# Patient Record
Sex: Female | Born: 1970 | Race: Black or African American | Hispanic: No | Marital: Married | Smoking: Never smoker
Health system: Southern US, Community
[De-identification: ages and names within clinical notes are randomized; demographics above are authoritative.]

## PROBLEM LIST (undated history)

## (undated) DIAGNOSIS — G4730 Sleep apnea, unspecified: Secondary | ICD-10-CM

## (undated) DIAGNOSIS — K6289 Other specified diseases of anus and rectum: Secondary | ICD-10-CM

## (undated) DIAGNOSIS — Z951 Presence of aortocoronary bypass graft: Secondary | ICD-10-CM

## (undated) DIAGNOSIS — E0500 Thyrotoxicosis with diffuse goiter without thyrotoxic crisis or storm: Secondary | ICD-10-CM

## (undated) DIAGNOSIS — M1990 Unspecified osteoarthritis, unspecified site: Secondary | ICD-10-CM

## (undated) DIAGNOSIS — I776 Arteritis, unspecified: Secondary | ICD-10-CM

## (undated) DIAGNOSIS — I2510 Atherosclerotic heart disease of native coronary artery without angina pectoris: Principal | ICD-10-CM

## (undated) DIAGNOSIS — G43909 Migraine, unspecified, not intractable, without status migrainosus: Secondary | ICD-10-CM

## (undated) DIAGNOSIS — K219 Gastro-esophageal reflux disease without esophagitis: Secondary | ICD-10-CM

## (undated) DIAGNOSIS — E789 Disorder of lipoprotein metabolism, unspecified: Secondary | ICD-10-CM

## (undated) DIAGNOSIS — K5150 Left sided colitis without complications: Secondary | ICD-10-CM

## (undated) DIAGNOSIS — M797 Fibromyalgia: Secondary | ICD-10-CM

## (undated) DIAGNOSIS — E039 Hypothyroidism, unspecified: Secondary | ICD-10-CM

## (undated) DIAGNOSIS — D649 Anemia, unspecified: Secondary | ICD-10-CM

## (undated) DIAGNOSIS — J309 Allergic rhinitis, unspecified: Secondary | ICD-10-CM

## (undated) DIAGNOSIS — I214 Non-ST elevation (NSTEMI) myocardial infarction: Secondary | ICD-10-CM

## (undated) DIAGNOSIS — E119 Type 2 diabetes mellitus without complications: Secondary | ICD-10-CM

## (undated) HISTORY — DX: Thyrotoxicosis with diffuse goiter without thyrotoxic crisis or storm: E05.00

## (undated) HISTORY — PX: COLONOSCOPY W/ BIOPSIES AND POLYPECTOMY: SHX1376

## (undated) HISTORY — DX: Migraine, unspecified, not intractable, without status migrainosus: G43.909

## (undated) HISTORY — DX: Hypothyroidism, unspecified: E03.9

## (undated) HISTORY — DX: Anemia, unspecified: D64.9

## (undated) HISTORY — DX: Other specified diseases of anus and rectum: K62.89

## (undated) HISTORY — DX: Gastro-esophageal reflux disease without esophagitis: K21.9

## (undated) HISTORY — DX: Allergic rhinitis, unspecified: J30.9

## (undated) HISTORY — DX: Arteritis, unspecified: I77.6

## (undated) HISTORY — DX: Sleep apnea, unspecified: G47.30

## (undated) HISTORY — DX: Atherosclerotic heart disease of native coronary artery without angina pectoris: I25.10

## (undated) HISTORY — DX: Left sided colitis without complications: K51.50

---

## 1994-02-01 HISTORY — PX: DILATION AND CURETTAGE OF UTERUS: SHX78

## 1997-12-02 ENCOUNTER — Other Ambulatory Visit: Admission: RE | Admit: 1997-12-02 | Discharge: 1997-12-02 | Payer: Self-pay | Admitting: Gastroenterology

## 1998-03-14 ENCOUNTER — Emergency Department (HOSPITAL_COMMUNITY): Admission: EM | Admit: 1998-03-14 | Discharge: 1998-03-14 | Payer: Self-pay | Admitting: Emergency Medicine

## 1998-11-26 ENCOUNTER — Other Ambulatory Visit: Admission: RE | Admit: 1998-11-26 | Discharge: 1998-11-26 | Payer: Self-pay | Admitting: *Deleted

## 1998-12-29 ENCOUNTER — Ambulatory Visit (HOSPITAL_COMMUNITY): Admission: RE | Admit: 1998-12-29 | Discharge: 1998-12-29 | Payer: Self-pay | Admitting: Family Medicine

## 1998-12-29 ENCOUNTER — Encounter: Payer: Self-pay | Admitting: Family Medicine

## 1999-01-09 ENCOUNTER — Ambulatory Visit (HOSPITAL_COMMUNITY): Admission: RE | Admit: 1999-01-09 | Discharge: 1999-01-09 | Payer: Self-pay | Admitting: Endocrinology

## 1999-01-09 ENCOUNTER — Encounter: Payer: Self-pay | Admitting: Endocrinology

## 1999-11-26 ENCOUNTER — Other Ambulatory Visit: Admission: RE | Admit: 1999-11-26 | Discharge: 1999-11-26 | Payer: Self-pay | Admitting: *Deleted

## 2002-04-30 ENCOUNTER — Other Ambulatory Visit: Admission: RE | Admit: 2002-04-30 | Discharge: 2002-04-30 | Payer: Self-pay | Admitting: Obstetrics and Gynecology

## 2003-05-30 ENCOUNTER — Other Ambulatory Visit: Admission: RE | Admit: 2003-05-30 | Discharge: 2003-05-30 | Payer: Self-pay | Admitting: Obstetrics and Gynecology

## 2003-05-30 ENCOUNTER — Other Ambulatory Visit: Admission: RE | Admit: 2003-05-30 | Discharge: 2003-05-30 | Payer: Self-pay | Admitting: *Deleted

## 2004-01-27 ENCOUNTER — Emergency Department (HOSPITAL_COMMUNITY): Admission: EM | Admit: 2004-01-27 | Discharge: 2004-01-27 | Payer: Self-pay | Admitting: Emergency Medicine

## 2004-01-29 ENCOUNTER — Ambulatory Visit: Payer: Self-pay | Admitting: Internal Medicine

## 2004-01-29 ENCOUNTER — Inpatient Hospital Stay (HOSPITAL_COMMUNITY): Admission: EM | Admit: 2004-01-29 | Discharge: 2004-02-03 | Payer: Self-pay | Admitting: Emergency Medicine

## 2004-02-06 ENCOUNTER — Ambulatory Visit: Payer: Self-pay | Admitting: Internal Medicine

## 2004-02-14 ENCOUNTER — Ambulatory Visit: Payer: Self-pay | Admitting: Internal Medicine

## 2004-02-14 ENCOUNTER — Inpatient Hospital Stay (HOSPITAL_COMMUNITY): Admission: EM | Admit: 2004-02-14 | Discharge: 2004-02-18 | Payer: Self-pay | Admitting: Emergency Medicine

## 2004-02-20 ENCOUNTER — Encounter: Admission: RE | Admit: 2004-02-20 | Discharge: 2004-05-20 | Payer: Self-pay | Admitting: *Deleted

## 2004-03-18 ENCOUNTER — Ambulatory Visit: Payer: Self-pay | Admitting: Internal Medicine

## 2004-03-23 ENCOUNTER — Ambulatory Visit: Payer: Self-pay | Admitting: Internal Medicine

## 2004-04-22 ENCOUNTER — Ambulatory Visit: Payer: Self-pay | Admitting: Internal Medicine

## 2004-05-08 ENCOUNTER — Ambulatory Visit (HOSPITAL_COMMUNITY): Admission: RE | Admit: 2004-05-08 | Discharge: 2004-05-08 | Payer: Self-pay | Admitting: Family Medicine

## 2004-06-15 ENCOUNTER — Emergency Department (HOSPITAL_COMMUNITY): Admission: EM | Admit: 2004-06-15 | Discharge: 2004-06-15 | Payer: Self-pay | Admitting: Emergency Medicine

## 2004-06-25 ENCOUNTER — Ambulatory Visit: Payer: Self-pay | Admitting: Internal Medicine

## 2005-02-22 ENCOUNTER — Ambulatory Visit: Payer: Self-pay | Admitting: Internal Medicine

## 2005-08-31 IMAGING — CR DG KNEE 1-2V BILAT
4 series · 4 of 4 positions shown · non-contrast
Comparison: none

CLINICAL DATA: Knee pain.  No injury. 
 LEFT KNEE ? TWO VIEW:
 Two views of the left knee show no acute abnormality.  The joint spaces are normal and no effusion is seen.

[view not recorded (1 of 4)]
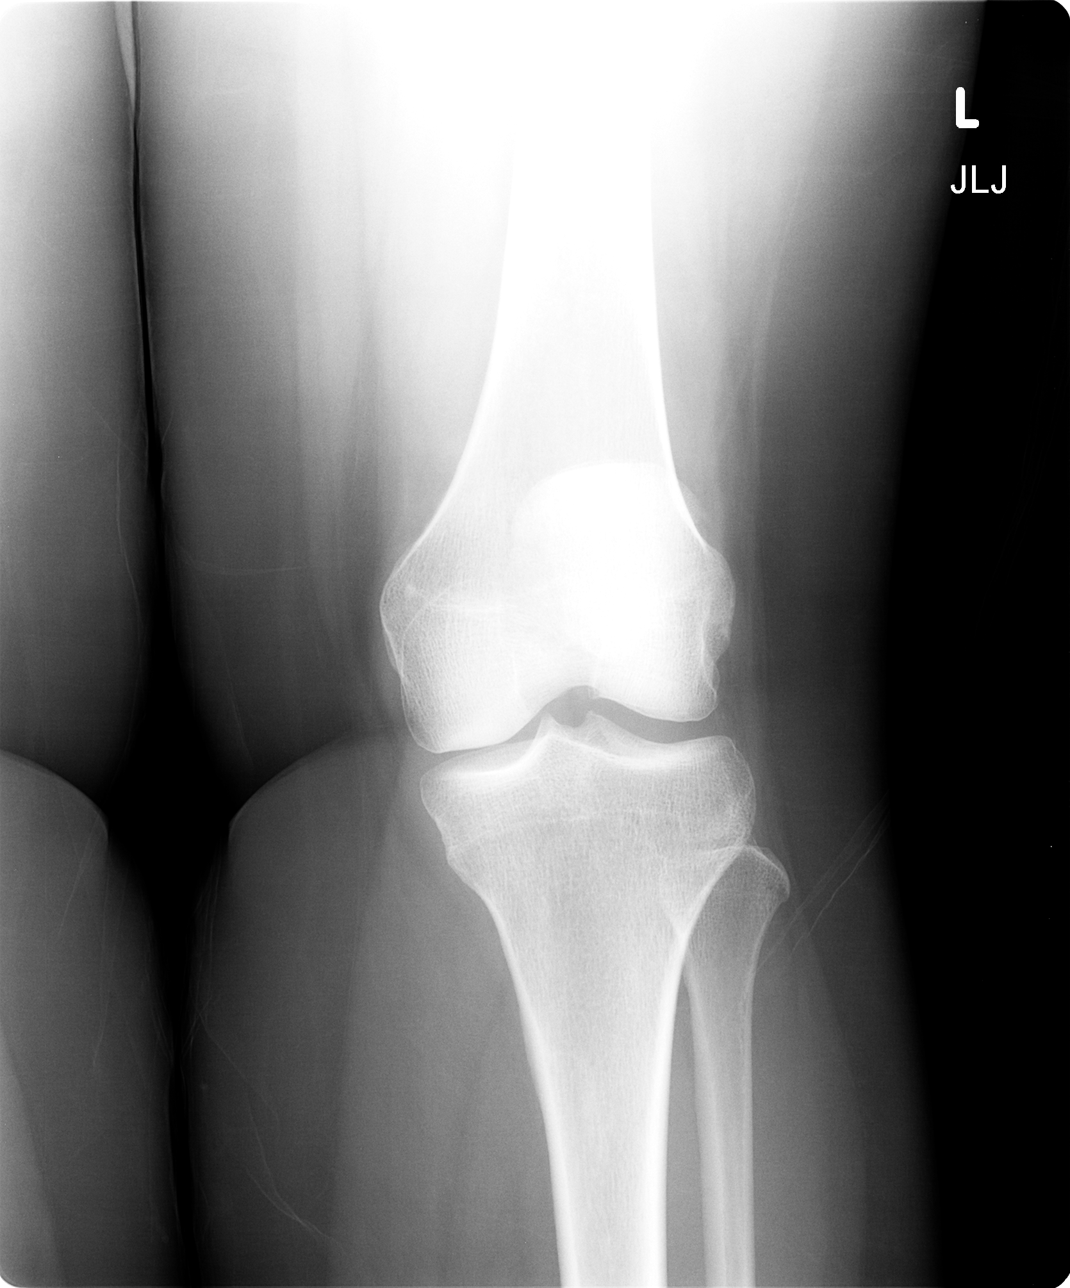

[view not recorded (2 of 4)]
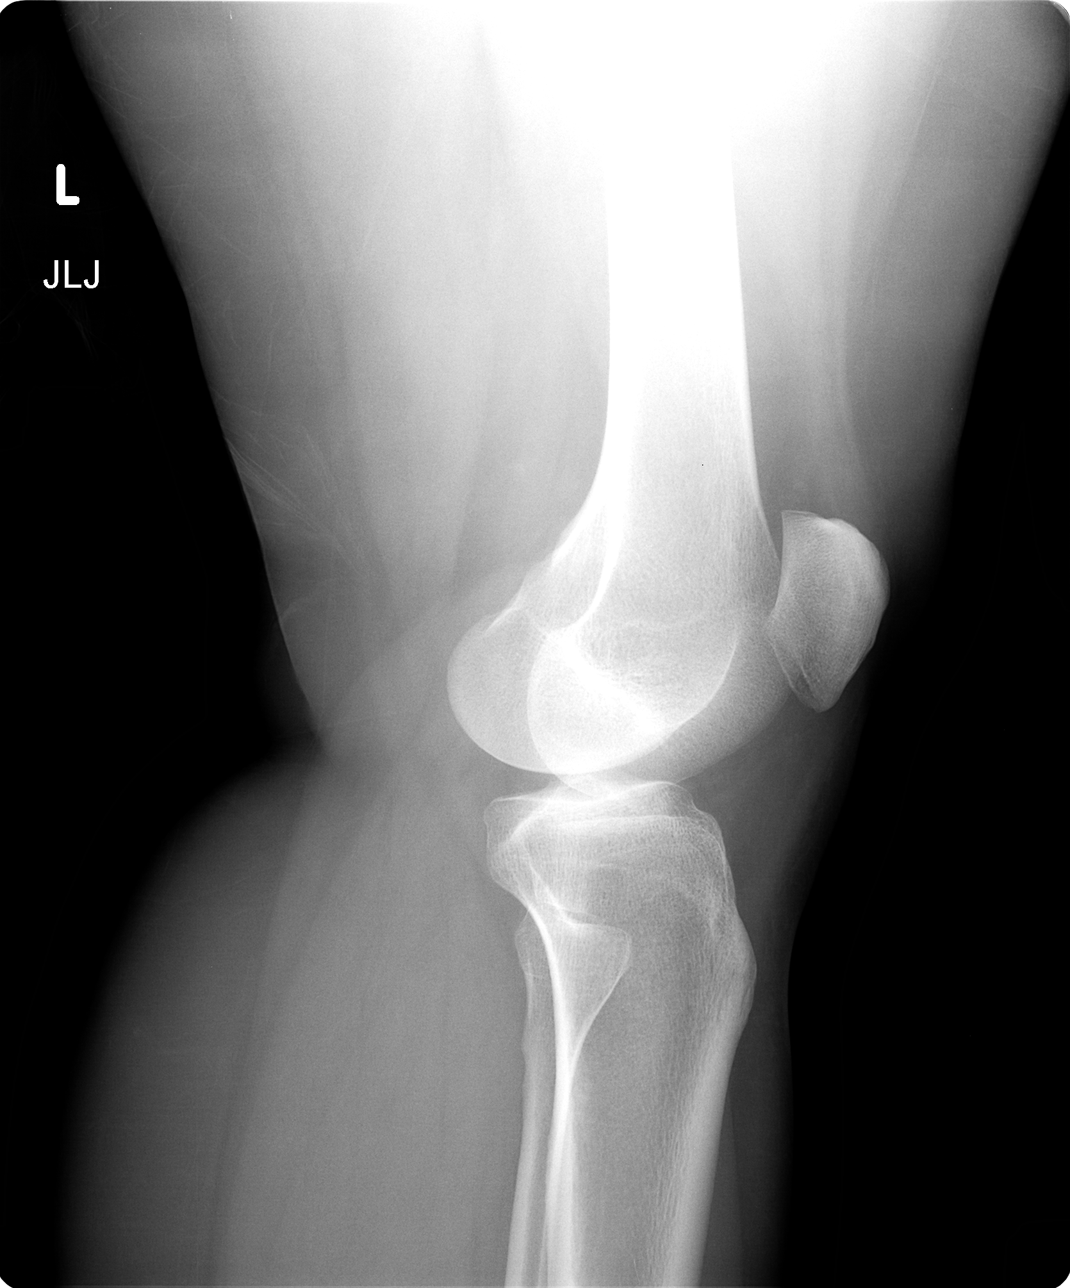

[view not recorded (3 of 4)]
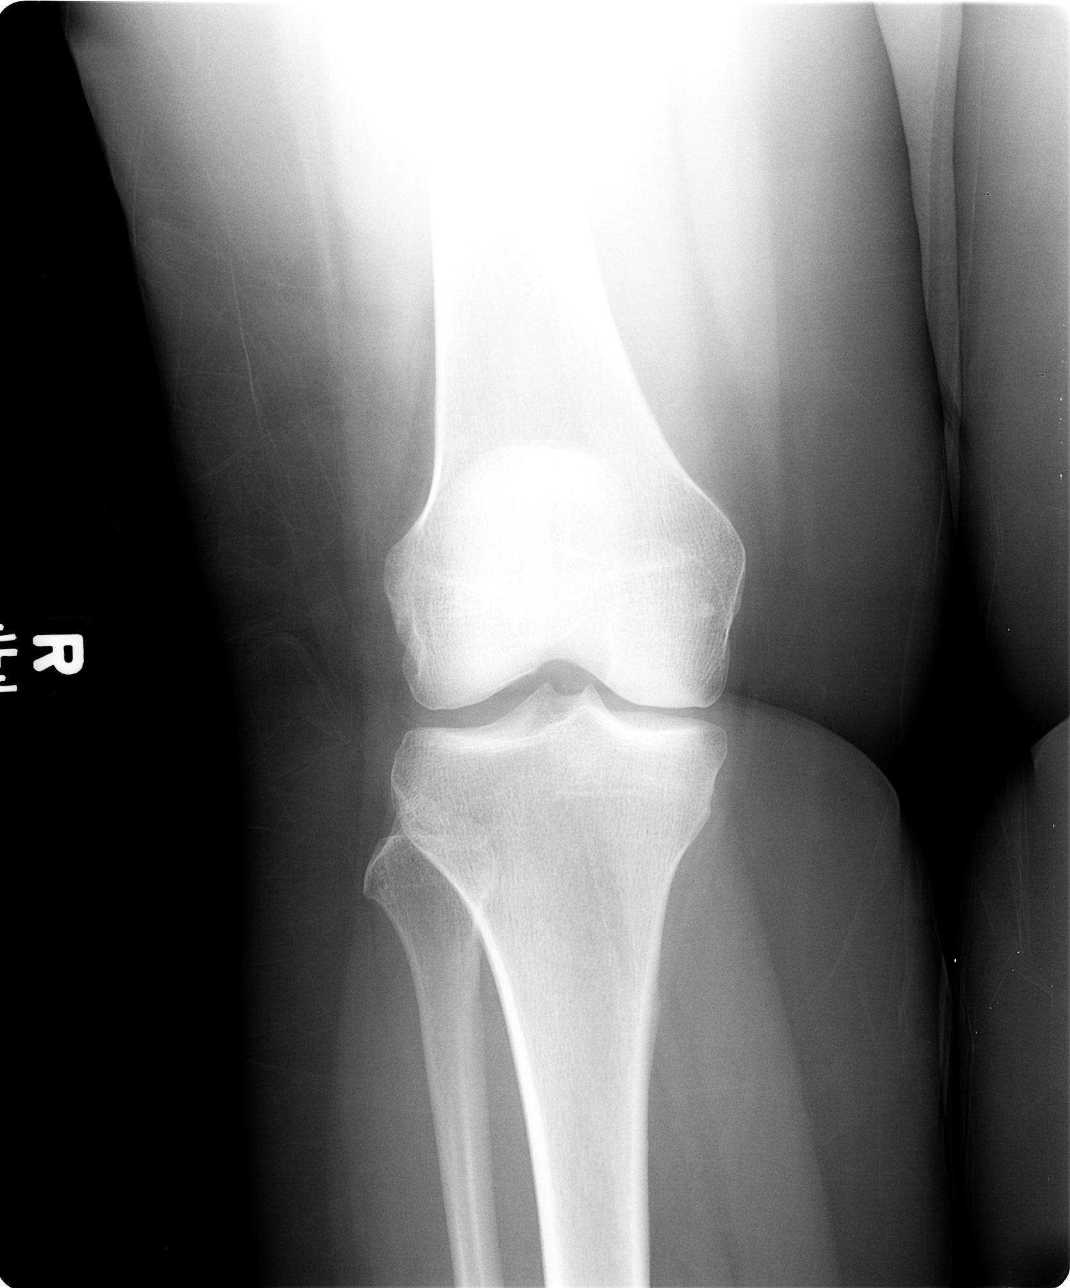

[view not recorded (4 of 4)]
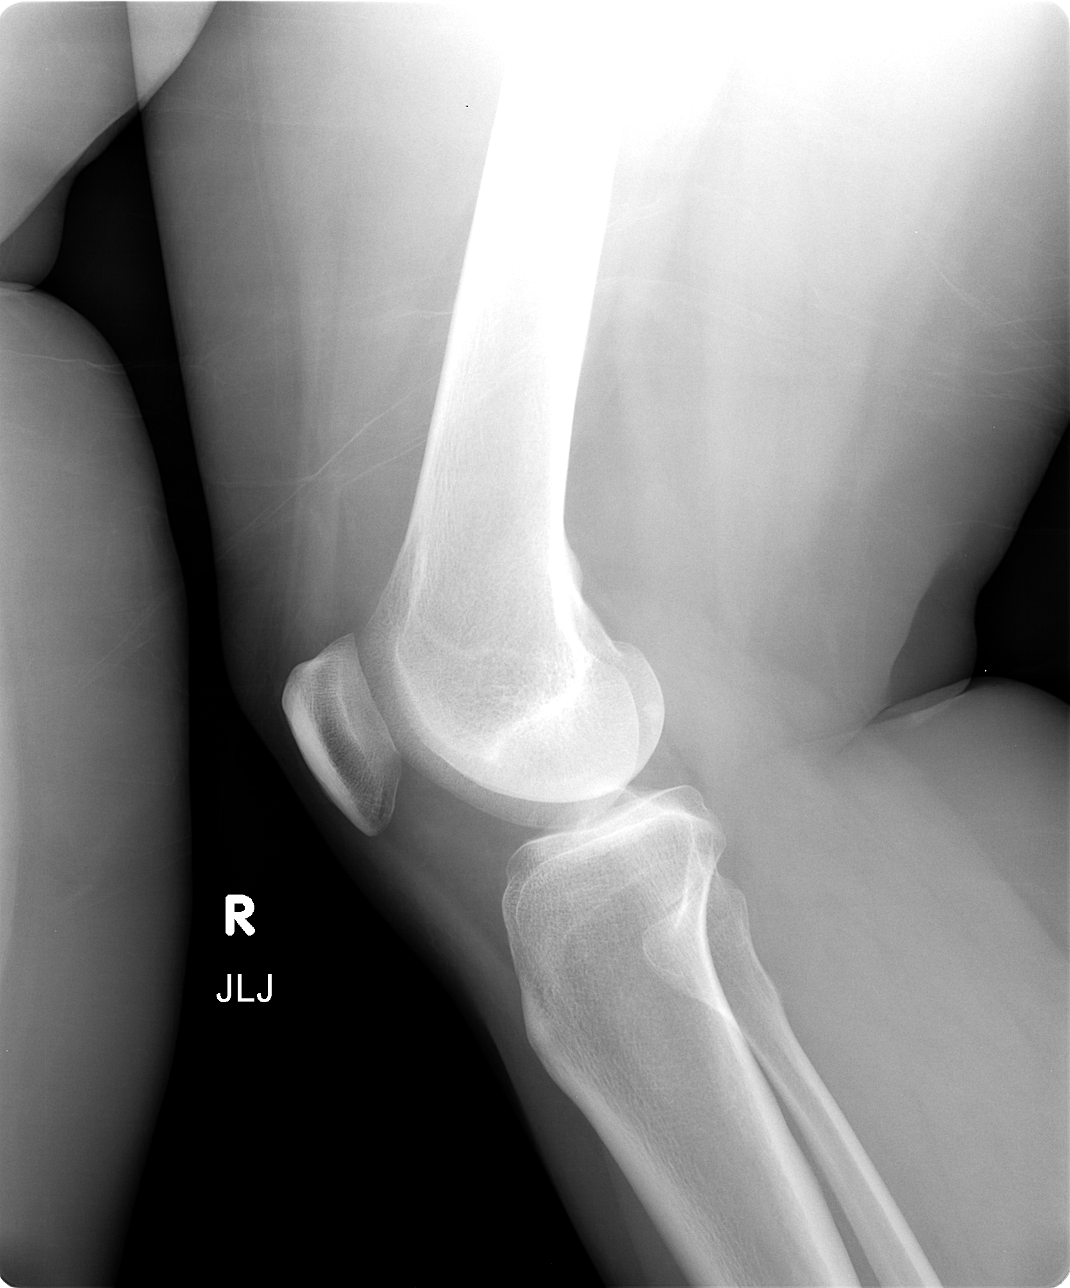

[4 of 4 positions shown; findings below may reference images not displayed]

IMPRESSION: Negative left knee. 
 RIGHT KNEE ? TWO VIEW:
 Two views of the right knee show no acute abnormality.  Joint spaces are within normal limits and no effusion is seen.
IMPRESSION: Negative right knee.

## 2005-11-09 ENCOUNTER — Ambulatory Visit: Payer: Self-pay | Admitting: Internal Medicine

## 2006-03-03 ENCOUNTER — Ambulatory Visit: Payer: Self-pay | Admitting: Internal Medicine

## 2006-03-18 ENCOUNTER — Encounter (INDEPENDENT_AMBULATORY_CARE_PROVIDER_SITE_OTHER): Payer: Self-pay | Admitting: Specialist

## 2006-03-18 ENCOUNTER — Ambulatory Visit (HOSPITAL_COMMUNITY): Admission: RE | Admit: 2006-03-18 | Discharge: 2006-03-18 | Payer: Self-pay | Admitting: Obstetrics and Gynecology

## 2006-04-19 ENCOUNTER — Ambulatory Visit: Payer: Self-pay | Admitting: Internal Medicine

## 2006-05-05 ENCOUNTER — Encounter (INDEPENDENT_AMBULATORY_CARE_PROVIDER_SITE_OTHER): Payer: Self-pay | Admitting: *Deleted

## 2006-05-05 ENCOUNTER — Ambulatory Visit: Payer: Self-pay | Admitting: Internal Medicine

## 2006-07-03 HISTORY — PX: ABDOMINAL HYSTERECTOMY: SHX81

## 2006-07-26 ENCOUNTER — Ambulatory Visit (HOSPITAL_COMMUNITY): Admission: RE | Admit: 2006-07-26 | Discharge: 2006-07-27 | Payer: Self-pay | Admitting: Obstetrics & Gynecology

## 2006-07-26 ENCOUNTER — Encounter (INDEPENDENT_AMBULATORY_CARE_PROVIDER_SITE_OTHER): Payer: Self-pay | Admitting: Obstetrics & Gynecology

## 2006-08-06 ENCOUNTER — Ambulatory Visit (HOSPITAL_COMMUNITY): Admission: AD | Admit: 2006-08-06 | Discharge: 2006-08-07 | Payer: Self-pay | Admitting: Obstetrics and Gynecology

## 2007-05-17 ENCOUNTER — Encounter: Payer: Self-pay | Admitting: Internal Medicine

## 2007-10-23 ENCOUNTER — Encounter: Payer: Self-pay | Admitting: Internal Medicine

## 2007-12-04 ENCOUNTER — Telehealth: Payer: Self-pay | Admitting: Internal Medicine

## 2007-12-27 ENCOUNTER — Ambulatory Visit: Payer: Self-pay | Admitting: Internal Medicine

## 2008-02-19 IMAGING — CR DG CHEST 2V
2 series · 2 of 2 positions shown · non-contrast
Comparison: none

CLINICAL DATA: Shallow breathing postop

Chest 2 view:
Comparison to previous days exam. Perihilar interstitial and alveolar opacities
slightly improved since previous exam. Persistent airspace consolidation in the
posterior left lower lobe . No effusion. Heart size upper limits normal.

[view not recorded (1 of 2)]
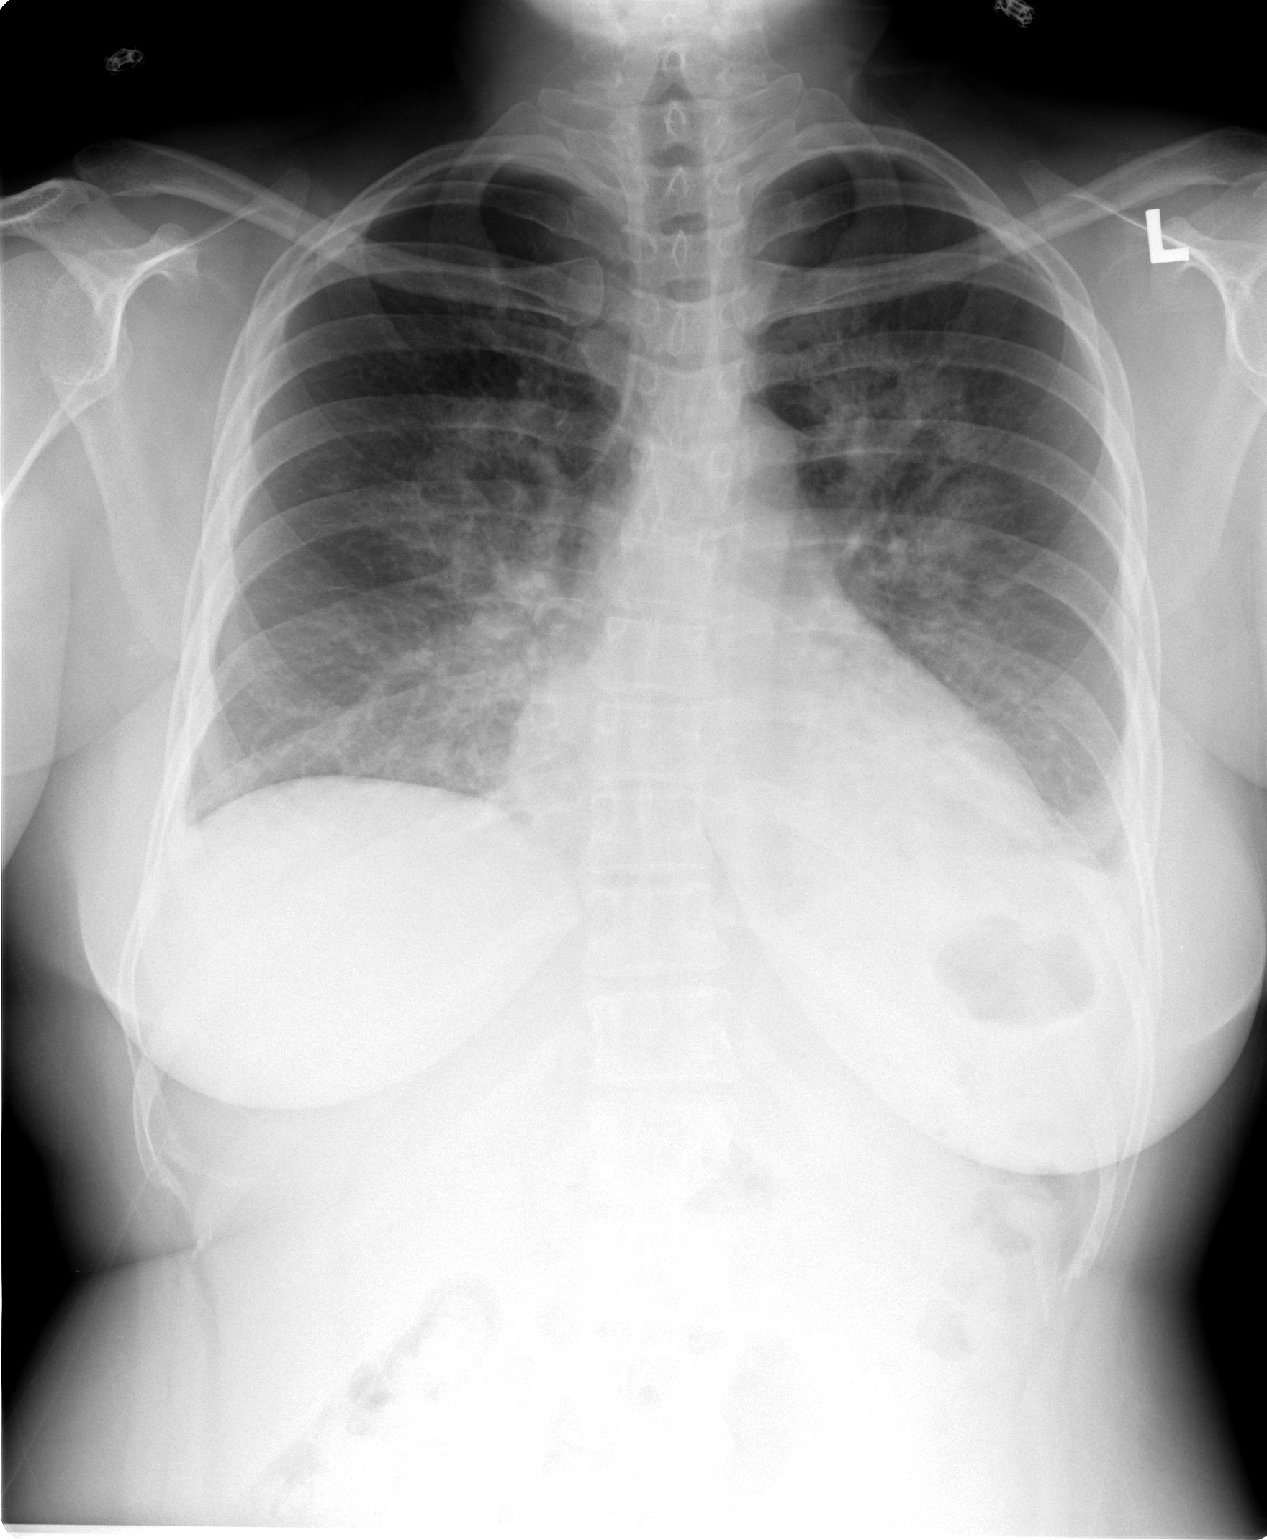

[view not recorded (2 of 2)]
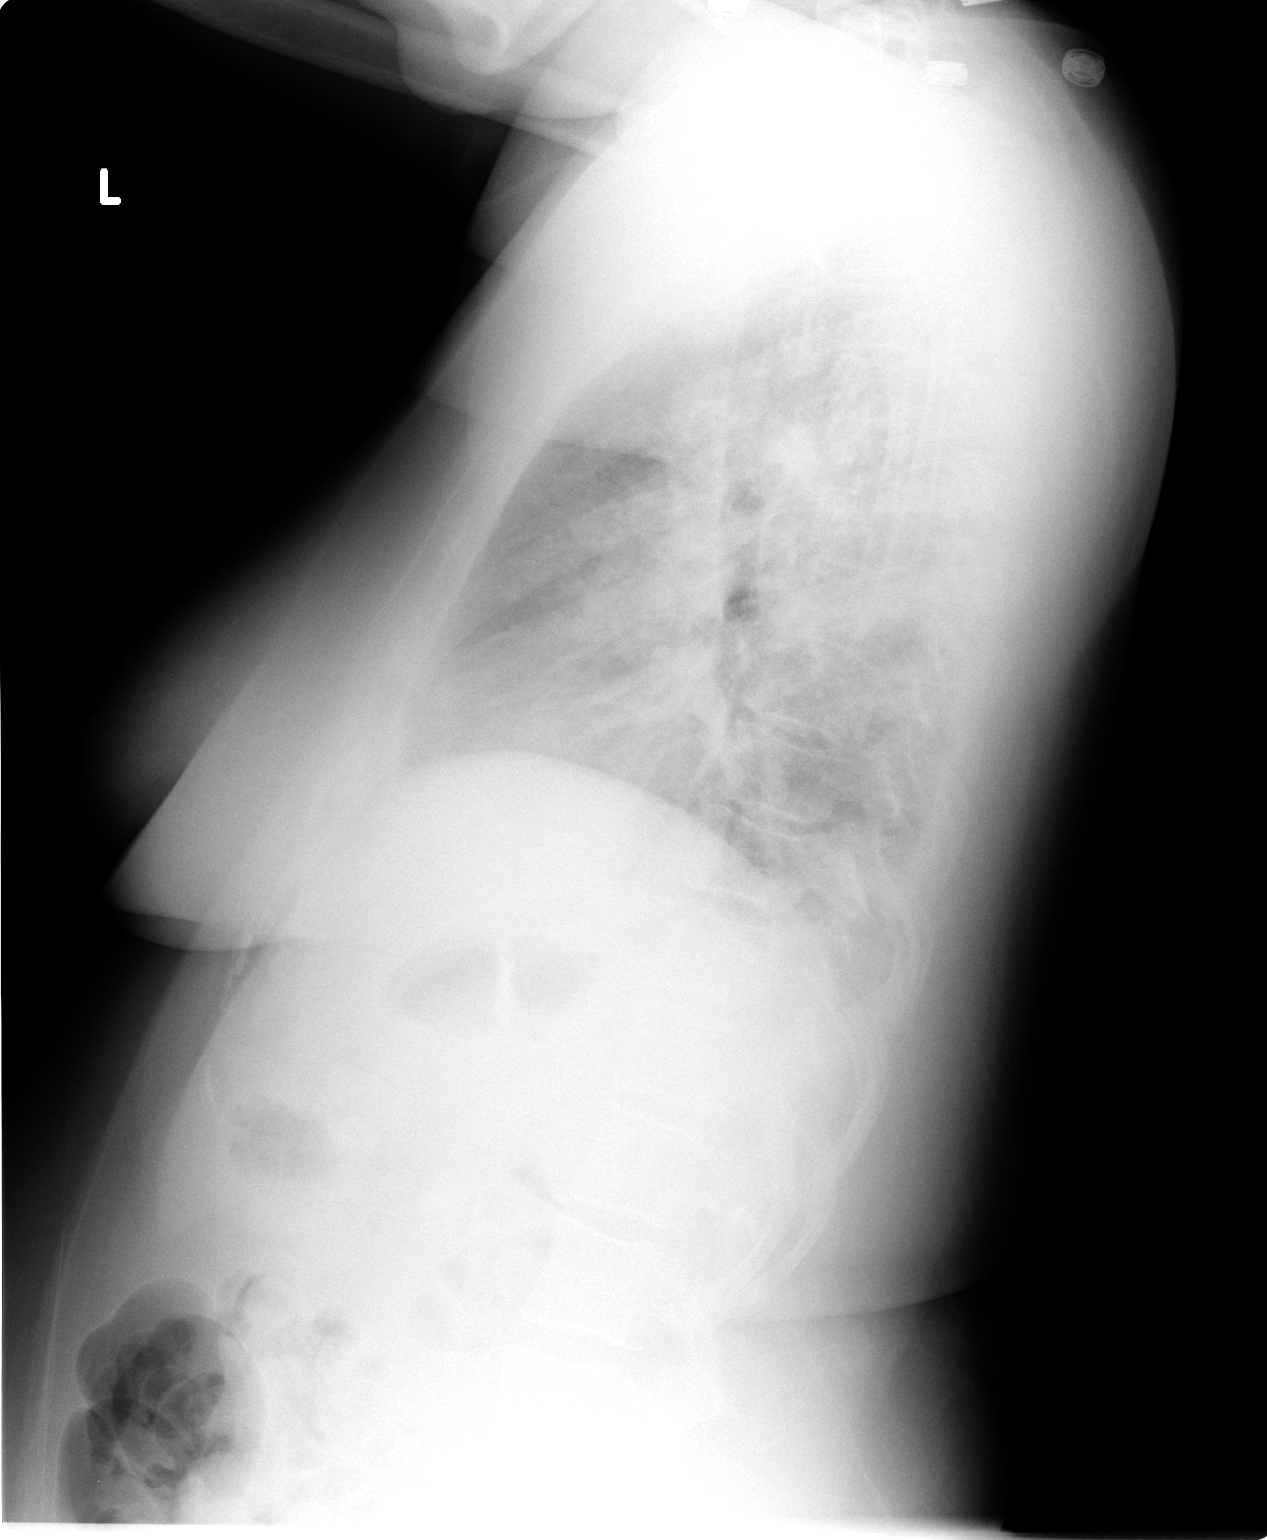

[2 of 2 positions shown; findings below may reference images not displayed]

IMPRESSION: 1. Interval improvement in interstitial edema or infiltrates with persistent
left retrocardiac consolidation.

## 2008-02-23 ENCOUNTER — Encounter: Payer: Self-pay | Admitting: Internal Medicine

## 2008-02-26 ENCOUNTER — Telehealth: Payer: Self-pay | Admitting: Internal Medicine

## 2008-03-04 DIAGNOSIS — K6289 Other specified diseases of anus and rectum: Secondary | ICD-10-CM

## 2008-03-04 HISTORY — DX: Other specified diseases of anus and rectum: K62.89

## 2008-03-12 ENCOUNTER — Ambulatory Visit: Payer: Self-pay | Admitting: Internal Medicine

## 2008-03-13 ENCOUNTER — Encounter: Payer: Self-pay | Admitting: Internal Medicine

## 2008-03-20 ENCOUNTER — Encounter: Payer: Self-pay | Admitting: Internal Medicine

## 2008-03-20 ENCOUNTER — Ambulatory Visit: Payer: Self-pay | Admitting: Internal Medicine

## 2008-03-29 ENCOUNTER — Encounter: Payer: Self-pay | Admitting: Internal Medicine

## 2008-04-11 ENCOUNTER — Ambulatory Visit: Payer: Self-pay | Admitting: Internal Medicine

## 2008-04-11 DIAGNOSIS — K5150 Left sided colitis without complications: Secondary | ICD-10-CM

## 2008-05-24 ENCOUNTER — Encounter: Payer: Self-pay | Admitting: Internal Medicine

## 2008-06-18 ENCOUNTER — Telehealth: Payer: Self-pay | Admitting: Internal Medicine

## 2008-11-19 ENCOUNTER — Telehealth: Payer: Self-pay | Admitting: Internal Medicine

## 2008-11-20 ENCOUNTER — Ambulatory Visit: Payer: Self-pay | Admitting: Gastroenterology

## 2008-11-20 DIAGNOSIS — R109 Unspecified abdominal pain: Secondary | ICD-10-CM

## 2008-11-20 DIAGNOSIS — D649 Anemia, unspecified: Secondary | ICD-10-CM

## 2008-11-20 DIAGNOSIS — E785 Hyperlipidemia, unspecified: Secondary | ICD-10-CM

## 2008-11-20 DIAGNOSIS — R197 Diarrhea, unspecified: Secondary | ICD-10-CM

## 2008-11-20 DIAGNOSIS — E039 Hypothyroidism, unspecified: Secondary | ICD-10-CM | POA: Insufficient documentation

## 2008-11-20 DIAGNOSIS — E119 Type 2 diabetes mellitus without complications: Secondary | ICD-10-CM

## 2008-11-20 DIAGNOSIS — Z794 Long term (current) use of insulin: Secondary | ICD-10-CM

## 2008-11-21 LAB — CONVERTED CEMR LAB
Basophils Absolute: 0.1 10*3/uL (ref 0.0–0.1)
Basophils Relative: 0.9 % (ref 0.0–3.0)
CRP, High Sensitivity: 11.5 — ABNORMAL HIGH (ref 0.00–5.00)
Eosinophils Absolute: 0.3 10*3/uL (ref 0.0–0.7)
Eosinophils Relative: 3.7 % (ref 0.0–5.0)
HCT: 39 % (ref 36.0–46.0)
Hemoglobin: 12.7 g/dL (ref 12.0–15.0)
Lymphocytes Relative: 24.8 % (ref 12.0–46.0)
Lymphs Abs: 1.8 10*3/uL (ref 0.7–4.0)
MCHC: 32.6 g/dL (ref 30.0–36.0)
MCV: 89.8 fL (ref 78.0–100.0)
Monocytes Absolute: 0.7 10*3/uL (ref 0.1–1.0)
Monocytes Relative: 10.2 % (ref 3.0–12.0)
Neutro Abs: 4.4 10*3/uL (ref 1.4–7.7)
Neutrophils Relative %: 60.4 % (ref 43.0–77.0)
Platelets: 341 10*3/uL (ref 150.0–400.0)
RBC: 4.35 M/uL (ref 3.87–5.11)
RDW: 13.5 % (ref 11.5–14.6)
WBC: 7.3 10*3/uL (ref 4.5–10.5)

## 2008-11-22 ENCOUNTER — Encounter: Payer: Self-pay | Admitting: Physician Assistant

## 2008-12-09 ENCOUNTER — Telehealth: Payer: Self-pay | Admitting: Physician Assistant

## 2008-12-27 ENCOUNTER — Ambulatory Visit: Payer: Self-pay | Admitting: Internal Medicine

## 2010-03-23 ENCOUNTER — Encounter: Payer: Self-pay | Admitting: Internal Medicine

## 2010-03-31 NOTE — Letter (Signed)
Summary: Office Visit Letter  [facility]  [address]   [place], [place] [address]   Phone: [phone]  Fax: [phone]      March 23, 2010 MRN: [id number]   [place], [place]  [address]   Dear Ms. Roehrich,   According to our records, it is time for you to schedule a follow-up office visit with [place].   At your convenience, please call [phone] (option #2)to schedule an office visit. If you have any questions, concerns, or feel that this letter is in error, we would appreciate your call.   Sincerely,   Silvano Rusk, M.D.  [facility] [phone]

## 2010-05-19 LAB — GLUCOSE, CAPILLARY
Glucose-Capillary: 106 mg/dL — ABNORMAL HIGH (ref 70–99)
Glucose-Capillary: 73 mg/dL (ref 70–99)

## 2010-06-16 NOTE — Op Note (Signed)
NAMEAKSHITA, [name] [name]                ACCOUNT NO.:  [id number]   MEDICAL RECORD NO.:  [id number]          PATIENT TYPE:  MAT   LOCATION:  MATC                          FACILITY:  [facility]   PHYSICIAN:  Servando Salina, M.D.DATE OF BIRTH:  01-15-1971   DATE OF PROCEDURE:  08/06/2006  DATE OF DISCHARGE:                               OPERATIVE REPORT   PREOPERATIVE DIAGNOSES:  Recurrent postop vaginal bleeding, status post  vaginal cuff repair,  status post total laparoscopic hysterectomy with  da Vinci on July 26, 2006.   POSTOPERATIVE DIAGNOSES:  Recurrent postop vaginal bleeding, status post  vaginal cuff repair,  status post total laparoscopic hysterectomy with  da Vinci on July 26, 2006.   PROCEDURE:  Exploration of vaginal cuff.   SURGEON:  Servando Salina, M.D.   ASSISTANT:  None.   INDICATIONS:  This is a 40 year old female who had just underwent an  repair of a vaginal cuff dehiscence and who had a completely hemostatic  vaginal cuff prior to being extubated and transferred to the bed to be  transferred to recovery room who was thought to have vaginal bleeding  again. Due to the bleeding, the patient was reintubated and the vaginal  cuff explored.   DESCRIPTION OF PROCEDURE:  The patient was placed back on the operating  room table.  She was reintubated, prepped and draped sterilely. The clot  from the vagina was removed, a weighted speculum placed, retractors were  placed.  The vaginal cuff was inspected.  No active bleeding was noted.  The sutures were in place. The left angle had one area that a figure-of-  eight suture was placed.  The patient was placed in reverse  Trendelenburg was monitored for additional bleeding and none was seen.  The decision was then made to do a pack with vaginal packing. An  indwelling Foley catheter was then placed and the patient transferred to  the recovery room.  Estimated blood loss was minimal.  Complication from  the procedure was  none.  The patient was transferred to the recovery  room with the tube in place as she was not ready for extubation even  though she was breathing 100% oxygen.      Servando Salina, M.D.  Electronically Signed     /MEDQ  D:  08/06/2006  T:  08/06/2006  Job:  [id number]

## 2010-06-16 NOTE — Op Note (Signed)
[name], [name]                ACCOUNT NO.:  [id number]   MEDICAL RECORD NO.:  [id number]          PATIENT TYPE:  OIB   LOCATION:  0947                         FACILITY:  [facility]   PHYSICIAN:  Princess Bruins, M.D.DATE OF BIRTH:  Sep 04, 1970   DATE OF PROCEDURE:  07/26/2006  DATE OF DISCHARGE:                               OPERATIVE REPORT   PREOPERATIVE DIAGNOSIS:  Dysfunctional uterine bleeding and pelvic pains  status post endometrial ablation.   POSTOPERATIVE DIAGNOSIS:  Dysfunctional uterine bleeding and pelvic  pains status post endometrial ablation.   PROCEDURE:  Total laparoscopic hysterectomy assisted with Research officer, trade union  robot.   SURGEON:  Dr. Princess Bruins.   ASSISTANT:  Dr. Syble Creek.   PROCEDURE:  Under general anesthesia with endotracheal intubation, the  patient is in lithotomy position.  She is prepped with Betadine on the  abdominal, suprapubic, vulvar and vaginal areas and draped as usual.  The bladder catheter is introduced in the urethra and a Foley is left in  place.  The Rumi is used with a KOH ring.  The uterus mobile, no adnexal  mass.  We put the Rumi with a KOH ring in place in the usual fashion  without any difficulty.  We then go to abdominal time.  We make a  supraumbilical incision with a scalpel after infiltrating with Marcaine  0.25% plain.  We opened the aponeurosis with Mayo scissors and opened  the parietal peritoneum bluntly with a finger.  We put a pursestring  stitch with Vicryl 0 at the aponeurosis.  We then introduced the [facility]  and create a pneumoperitoneum with CO2.  We put the camera at that level  and inspect the abdominopelvic cavities.  No adhesion is present.  The  uterus is normal in appearance with small myomas.  No adnexal mass and  no pathology in the pelvis otherwise, no adhesions in the abdominal  cavity.  We therefore measure all other ports.  We put in place three  robotic ports and one assistant port in the usual  fashion.  We then dock  the robot and put the instruments in place.  The EndoShears scissor in  the right arm, the fenestrated bipolar in the left arm and in the third  robotic arm, the Cobra.  We then go to the console and start the  hysterectomy.  We cauterize and section the left round ligament.  We  then cauterized and section the left tube and the left utero-ovarian  ligament.  We go down to just before the left uterine artery.  We then  open the visceral peritoneum anteriorly and recline the bladder  downward.  We proceed exactly the same on the right side.  We then  completely recline the bladder downward.  We cauterized and section the  left uterine artery and then the right uterine artery.  We are then  ready to open the vaginal vault.  We start anteriorly right on the KOH  ring and go in a circular fashion all around the KOH ring at the  superior aspect of the vagina.  This way the uterus  is completely freed  and removed vaginally.  Hemostasis is adequate at all levels.  We  therefore remove the instruments and switch to two needle drivers in the  right and left arm and the third robotic arm has the fenestrated  bipolar.  We close the vaginal vault with figure-of-eight with Vicryl 0.  We close completely.  Hemostasis is adequate.  We irrigate and suction  the abdominopelvic cavity.  All instruments are then removed.  We undock  the robot and go to laparoscopic time.  We remove the extreme  Trendelenburg and complete the suction of the pelvic cavity.  Hemostasis  is still adequate.  We therefore remove instruments and remove all  trocars.  We evacuate the CO2.  We then close the incisions, first with  a Vicryl 4-0 at all incisions in a subcuticular stitch.  We then add  Dermabond on all incisions.  We had used the obturator vaginally.  It is  removed at the end of the surgery and hemostasis is adequate on all  incisions.  The count of instruments and sponges was complete.  The   estimated  blood loss was 50 mL.  No complications occurred and the patient was  brought to recovery room in good status.  Note that she received a dose  of Ancef 1 gram IV.  She had PAS devices.  We took a picture of the  uterus at the patient's request at the end of the intervention and the  specimen was sent to pathology.      Princess Bruins, M.D.  Electronically Signed     ML/MEDQ  D:  07/26/2006  T:  07/26/2006  Job:  [id number]

## 2010-06-16 NOTE — Op Note (Signed)
NAMECALIYAH, [name] [name]                ACCOUNT NO.:  [id number]   MEDICAL RECORD NO.:  [id number]          PATIENT TYPE:  MAT   LOCATION:  MATC                          FACILITY:  [facility]   PHYSICIAN:  Servando Salina, M.D.DATE OF BIRTH:  April 10, 1970   DATE OF PROCEDURE:  08/06/2006  DATE OF DISCHARGE:                               OPERATIVE REPORT   PREOPERATIVE DIAGNOSES:  Postoperative vaginal  bleeding,  vaginal cuff  dehiscence, status post total laparoscopic hysterectomy July 26, 2006.   POSTOPERATIVE DIAGNOSES:  Vaginal cuff dehiscence, status post total  laparoscopic hysterectomy, postop bleeding.   PROCEDURE:  Exploration of vaginal cuff,  repair of vaginal cuff  dehiscence.   SURGEON:  Servando Salina, M.D.   ASSISTANT:  None.   INDICATION:  This is a 40 year old patient of Dr. Dellis Filbert who underwent a  laparoscopic total hysterectomy via the [place] on July 26, 2006 at  [facility] who presented by EMS with complaint of sudden onset  of heavy vaginal bleeding. On presentation, the patient had a large  amount of blood.  The vaginal cuff was difficult to visualize however  there was blood in the vagina.  An ultrasound was performed that did not  show any vaginal cuff hematoma. Digital examination of the vaginal cuff  revealed that there was a separation. A decision was therefore made to  take the patient to the operating room for repair of the vaginal cuff.  Consent for the procedure was explained.  The patient was transferred to  the operating room.   PROCEDURE:  Under adequate general anesthesia, the patient was placed in  the dorsal lithotomy position.  She was sterilely prepped and draped in  the usual fashion. The bladder was catheterized of a scant amount of  urine.  A weighted speculum was placed in vagina, vaginal retractors  were placed.  The vaginal cuff was identified and noted to be separated  in its entirety.  There was no active bleeding noted. The  edges of the  cuff was identified with Allis clamps and interrupted #0 Vicryl sutures  were then placed to close the vaginal cuff.  The vaginal cuff was then  checked with the pickups and appeared to be well-approximated. The  vaginal cuff was then irrigated, suctioned of debris and the procedure  was then terminated.  Specimen was none.  Estimated blood loss was  minimal with the actual procedure.  Complication was none.  The patient  was to be transferred to the recovery room stable.      Servando Salina, M.D.  Electronically Signed     [place]/MEDQ  D:  08/06/2006  T:  08/06/2006  Job:  [id number]

## 2010-06-19 NOTE — Op Note (Signed)
NAMEKAELYNNE, CHRISTLEY                ACCOUNT NO.:  [id number]   MEDICAL RECORD NO.:  [id number]          PATIENT TYPE:  AMB   LOCATION:  SDC                           FACILITY:  [facility]   PHYSICIAN:  Diona Foley, M.D.   DATE OF BIRTH:  [birth year]   DATE OF PROCEDURE:  03/18/2006  DATE OF DISCHARGE:                               OPERATIVE REPORT   ADDENDUM:   INDICATIONS:  This is a 40 year old African-American female with a  history of menorrhagia underwent ultrasound which revealed a large  endometrial polyp.  She presents today for hysteroscopy, D&C, resection  of polyps and NovaSure endometrial ablation.  Prior to procedure the  risks, benefits and alternatives of procedure discussed with the patient  in detail. We discussed the risks which include but are not limited to  hemorrhage requiring transfusion, infection, injury to the uterus which  could require additional surgery through the abdomen and possible  hysterectomy.  We also discussed risks of anesthesia DVT.  The patient  voiced understanding of above and desires to proceed.  Informed consent  was obtained.  All questions answered before proceeding to the OR.   PROCEDURE:  The patient was taken to the operating room where she was  given a general anesthetic.  She was then prepped and draped in usual  sterile fashion and placed in dorsal lithotomy position.  The speculum  was placed in vagina and the cervix was grasped with a single-tooth  tenaculum.  A paracervical block was administered using a total of 20 mL  of 1% Nesacaine to facilitate postoperative pain relief.  The uterus was  then sounded to 10 cm. Cervix was then measured at 3.5 cm. The cervix  was then very gently dilated and diagnostic hysteroscope was then  introduced.  Findings revealed a very large polyp that was coming from  the right side of endometrial cavity.  The diagnostic scope was removed.  An attempt was made to grasp the polyp with the polyp forceps  but was  unsuccessful.  Therefore the cervix was then dilated slightly further  and the resectoscope was introduced. Fluid was changed from lactated  Ringer's to sorbitol for the resection. The polyp was then resected  using the double loop resectoscope. Once resection was complete the  polyp fragments were removed.  This was followed by sharp curettage  until any additional tissue was removed.  The scope was then  reintroduced and confirmed adequate removal of a polyp. At this point  the fluid was then switched back to lactated Ringer's and the scope was  flushed. Uterine cavity was flush as well.  After this was completed the  NovaSure device was then inserted with the measurements as noted above.  After the device was seated the CO2 test was performed and was passed.  During ablation the vacuum alarm was activated therefore the device was  removed and the new NovaSure device was then reintroduced.  This was  reseated into the uterine cavity setting width at 4.5 cm and ablation  was then performed after the CO2 test was passed.  Once ablation was  completed the NovaSure device was removed.  The hysteroscope was  reintroduced which revealed that the cavity was thoroughly ablated. All  instruments at this point were then removed from the patient's vagina.  There was minimal bleeding coming from the cervical os.  The tenaculum  site was cauterized with silver nitrate and was hemostatic.  The patient  was then taken out of dorsal position and was taken to recovery room  awake in stable condition and no complications.  All sponge, lap, needle  and instrument counts were correct x2.     Diona Foley, M.D.  Electronically Signed    JW/MEDQ  D:  03/18/2006  T:  03/18/2006  Job:  [id number]

## 2010-06-19 NOTE — Assessment & Plan Note (Signed)
[facility] OFFICE NOTE   TIMA, CURET                         MRN:          [id number]  DATE:03/03/2006                            DOB:          1970-05-22    CHIEF COMPLAINT:  Followup of ulcerative colitis and anemia.   [name] [name] is starting to notice some increased stools and borborygmi,  postprandial mainly.  Stools are mushy, two, maybe three a day, but no  bleeding.  There is no severe abdominal pain.  She did finally start  iron.  Dr. Brigitte Pulse has her on PolyFE.  She had a hemoglobin of 8.9 with an  MCV of 74 back in October, and she did not really follow up with me on  that to recheck her CBC.  Her blood sugars have been up a little bit.  Her insulin was adjusted from 10 to 12 on the Lantus.  She had a very  painful, swollen right thumb and saw Dr. Rockwell Alexandria of rheumatology, and he  is evaluating that.  She also has a uterine polyp and a fibroid and is  to undergo ablation or removal of the polyp by Dr. Jeffie Pollock on March 18, 2006.   Her weight is down 4 pounds from October to 178 from 182.  Her thumb is  much better.  She is on a prednisone taper.  That has affected her  sugars a little bit as well.  Medications are otherwise listed and  reviewed in the chart.  The Protonix was denied by her insurance, so she  is on Zantac once a day with some benefit.  She has reflux symptoms.  Again, these are helped but not relieved completely by the Zantac.   PAST MEDICAL HISTORY:  Reviewed and unchanged.  1. Patchy ulcerative colitis/indeterminate colitis.  2. Arthralgias and recent arthritis.  3. Iron-deficiency anemia thought secondary to menses and ulcerative      colitis, i.e., chronic occult blood loss and other losses.  4. Noncompliance for a variety of reasons, including cost issues.  5. Diabetes mellitus.  6. Hypothyroidism, prior Graves disease with radioactive iodine      ablation.  7. Prior cesarean  section.   She is due for a colonoscopy as well.   PHYSICAL EXAMINATION:  GENERAL:  An obese black woman in no acute  distress.  VITAL SIGNS:  Height 5 feet 1.  Weight 178 pounds.  Pulse 76.  Blood  pressure 110/70.  HEENT:  Eyes anicteric.  No injection.  No conjunctival changes.  ABDOMEN:  Soft, nontender, obese.  Bowel sounds are present.  No  organomegaly or mass.  SKIN:  Warm and dry.  Areas inspected, no rash.  NEUROLOGIC:  She is alert and oriented x3.   ASSESSMENT:  1. Ulcerative colitis/indeterminate colitis.  2. Blood-loss anemia, at least in part due to gastrointestinal losses.  3. Reflux symptoms.   PLAN:  1. She asked about changing the Lialda.  I am going to increase her      Asacol to nine tablets a day.  She has been on that before.  She  can split the dose.  Samples and three month prescription given.  I      do not want to make any changes right now, as she is getting ready      to have the uterine surgery, and she also needs to come back for a      colonoscopy at some point here for surveillance.  2. No labs today.  She just had some a couple of days ago with Dr.      Brigitte Pulse.  She is finally on her iron and is complying with that.  3. Nexium appears to be formulary.  We give samples and start that      once a day and stop her Zantac.  4. I want to see her back in two months to reassess, regroup, and      schedule a colonoscopy (I did not mention the colonoscopy to her      today), sooner if needed.  5. Dicyclomine 10 mg every 6 as needed.  May take with or before meals      to try to help with these cramps and loose stools.     Gatha Mayer, MD,FACG  Electronically Signed    CEG/MedQ  DD: 03/03/2006  DT: 03/03/2006  Job #: [id number]   cc:   Janalyn Rouse, M.D.  Ander Slade. Rockwell Alexandria, M.D.  Diona Foley, M.D.

## 2010-06-19 NOTE — H&P (Signed)
[name] [name], [name] [name]                ACCOUNT NO.:  [id number]   MEDICAL RECORD NO.:  [id number]          PATIENT TYPE:  INP   LOCATION:  4920                         FACILITY:  [facility]   PHYSICIAN:  Roselyn Meier, MD       DATE OF BIRTH:  Sep 13, 1970   DATE OF ADMISSION:  02/14/2004  DATE OF DISCHARGE:                                HISTORY & PHYSICAL   PRIMARY CARE PHYSICIAN:  [facility], Emeline General. Dema Severin, M.D.   GASTROENTEROLOGIST:  Sandy Salaam. Deatra Ina, M.D. of [facility].   CHIEF COMPLAINT:  Hyperglycemia noted during outpatient visit to  gastroenterology.   HISTORY OF PRESENT ILLNESS:  This 40 year old black female has a known  history of ulcerative colitis dating to 1999 which was diagnosed with  sigmoidoscopy. Since that time she was treated for a short course with  Asacol, but had no other immunomodulating therapies. Since that time she has  not been on chronic medications and her inflammatory bowel disease had been  quiescent up until November of 2005 when she started to experienced bloody  diarrhea which prompted eventually her admission at the end of December of  2005 in which she was treated with steroids as well as a short course of  antibiotics. She was subsequently discharged on 40 mg of prednisone a day,  and was to follow up with [facility] today at which point she  was found to be hyperglycemic. Her prednisone has since been reduced to 30  mg. Her symptoms in terms of inflammatory bowel disease, however, have  significantly improved in terms of frequency of diarrhea as well as the  degree to which she has blood in her stool.   She has no signs or symptoms to suggest infection.   In terms of pain, she has been using Vicodin at a higher frequency more  recently because of her abdominal pain.   ALLERGIES:  None.   MEDICATIONS:  1.  Pepcid 20 mg p.o. daily.  2.  Synthroid 88 mcg daily.  3.  Potassium chloride.  4.  Asacol 800  t.i.d.  5.  Prednisone 30 mg p.o. daily.  6.  Iron sulfate.   PAST MEDICAL HISTORY:  1.  Significant for ulcerative colitis as stated previously. Moreover, the      patient's course in terms of inflammatory bowel disease has not been      frequent. She was scheduled for an eventual colonoscopy to further      evaluate in terms of general screening for further areas of      inflammation. She has had an evaluation for bacterial or parasitic-      related diarrhea which have so far been without positive results.  2.  Hypothyroidism.  3.  Iron deficiency anemia, related to blood loss due to diarrhea.  4.  Abdominal pain, secondary to problem #1.   PAST SURGICAL HISTORY:  None.   SOCIAL HISTORY:  The patient is married, but currently separated. She has 2  children of her own who are healthy. She has adopted 1 child. 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  She denies any alcohol, IV drug use or nicotine use.   FAMILY HISTORY:  Her aunt has hypertension, and there is diabetes mellitus  type 2 but not type 1. In terms of autoimmune diseases, the patient herself  has hypothyroidism and her maternal grandmother has rheumatoid arthritis.  Maternal grandmother has diabetes mellitus and her paternal grandmother  supposedly had cervical cancer.   REVIEW OF SYSTEMS:  HEENT: She had some visual changes related after her  prednisone usage. CARDIOVASCULAR: No chest pain, no shortness of breath.  RESPIRATORY: No hemoptysis. GU: She has normal menses. GI: Per history of  present illness. ENDOCRINE: She has a known history of thyroid disease, but  no diabetes. NEUROLOGIC: No seizures, no headaches. PSYCHIATRIC: No history  of mental disorders. SKIN: No history of dermatitis or rash.   PHYSICAL EXAMINATION:  VITAL SIGNS:  Her blood pressure is 114/74, heart  rate is 123, temperature is 98.9, weight is 164, respirations are 20.  GENERAL:  This is a young  [place] American female without any apparent  distress.  HEENT:  Her facial appearance appears to be near cushingoid.  CARDIOVASCULAR:  She has a regular rate and rhythm without gallop or rub.  LUNGS:  Clear.  ABDOMEN:  Soft, she has mild tenderness in the left lower and upper  quadrant, bowel sounds are present. There is no rebound, there is no  guarding, there is no peritoneal sign.  EXTREMITIES:  Show small amount of nonpitting edema.   LABORATORY VALUES OF PERTINENCE:  Her sodium is 129, potassium is 4.7,  chloride is 94, bicarb is 26, anion gap is 9, creatinine is 1. Hemoglobin is  10.8, WBC count is 8.9, platelets are 644, MCV is 79.1. Her beta-hCG is  negative. Her urinalysis shows a specific gravity of 1.040, pH of 6.   ASSESSMENT AND PLAN:  Problem 1.  Hyperglycemia, likely steroid induced. Her  prednisone has been recently reduced today. In the setting of her  inflammatory bowel disease, I will continue her current dose as her symptoms  have significantly abated. There is no obvious infection to suggest a  secondary source of hyperglycemia. She will be placed on 10 units of Lantus  a day with sliding scale. Her glucomander has been turned off.   Problem 2.  Ulcerative colitis. Her symptoms are relatively controlled on  steroids and Asacol. Her opiate use is somewhat concerning and I will  preference they use Tylenol while in the hospital as she is at a slight risk  of toxic megacolon. Otherwise, will continue her outpatient regimen.   Problem 3.  Anemia. At this point she is not significantly anemic to warrant  intravenous iron therapy. I have restarted her oral iron sulfate.   Problem 4.  Pain. As stated in problem #2, will try to limit the amount of  opiates the patient takes.   Problem 5.  Hypothyroidism, possibly Hashimoto's. At this point her  Synthroid has been reduced in November from 100 to 88 mcg and no changes  will be made.     JMJ/MEDQ  D:  02/14/2004  T:   02/15/2004  Job:  [id number]

## 2010-06-19 NOTE — Discharge Summary (Signed)
NAMEMECHELLE, [name] [name]                ACCOUNT NO.:  [id number]   MEDICAL RECORD NO.:  [id number]          PATIENT TYPE:  INP   LOCATION:  [place]                         FACILITY:  [facility]   PHYSICIAN:  Melissa L. Lovena Le, MD  DATE OF BIRTH:  Sep 19, 1970   DATE OF ADMISSION:  02/14/2004  DATE OF DISCHARGE:  02/18/2004                                 DISCHARGE SUMMARY   ADDENDUM:  Please note this patient also carries a diagnosis at discharge of  hypothyroidism.  She has been restarted on her Synthroid 88 mcg daily.  The  patient has been encouraged to continue this.  I believe that she has not  been taking this medication for at least a week to 1-1/2 weeks because of  gastrointestinal upset.  She will therefore need a follow-up level in the  next two to four weeks.      MLT/MEDQ  D:  02/18/2004  T:  02/18/2004  Job:  [id number]   cc:   Emeline General. White, M.D.  [address]    [place] [address]  Fax: [phone]   Jacelyn Pi, M.D.  [phone] [address] [place] [address]  Fax: [phone]   Gatha Mayer, M.D. [facility], F.N.P.  [place]

## 2010-06-19 NOTE — Consult Note (Signed)
[name] [name], [name] [name]                ACCOUNT NO.:  [id number]   MEDICAL RECORD NO.:  [id number]          PATIENT TYPE:  INP   LOCATION:  5730                         FACILITY:  [place]   PHYSICIAN:  Gatha Mayer, M.D. LHCDATE OF BIRTH:  05-24-1970   DATE OF CONSULTATION:  01/29/2004  DATE OF DISCHARGE:                                   CONSULTATION   REASON FOR CONSULTATION:  Colitis on CT.   HISTORY OF PRESENT ILLNESS:  This is a 40 year old African American woman  that says that she was diagnosed with ulcerative colitis on flexible  sigmoidoscopy by Dr. Sandy Salaam. Kaplan in 1999.  She took enemas and nine  pills a day and for some reason stopped.  She carries a diagnosis of  irritable bowel syndrome as well.  It is hard to sort out the history right  now because she has been medicated with Phenergan and analgesics.  She  denies significant gastrointestinal disturbance since that time until the  last month or so.  She started having worsening diarrhea, crampy abdominal  pain, worsening symptoms, and presented to the emergency department with  severe crampy abdominal pain, diarrhea, some hematochezia, and a temperature  to 101.3 and then 102.7 today.  White count has been normal.  CT scan showed  nonspecific colitis, hepatic to splenic flexure.  The ascending and other  lesser colon were spared.   ALLERGIES:  She has no known drug allergies.   MEDICATIONS:  1.  IV Cipro 400 mg q.12h.  2.  Synthroid.  3.  Metronidazole 500 mg IV q.8h.  4.  Protonix 40 mg IV q.12h.  5.  Morphine p.r.n.  6.  Phenergan p.r.n.  7.  Acetaminophen p.r.n.   PAST MEDICAL HISTORY:  1.  Hypothyroidism.  2.  Status post radioactive iodine ablation for Grave's disease, now on      replacement therapy.  3.  Cesarean section in 1994.  4.  Otherwise above.   SOCIAL HISTORY:  She is married with children.  Office work, attending  college in Nurse, learning disability.  Her mother is here tonight as well as one of  her sons.  No drug use.   FAMILY HISTORY:  Apparently her father and brother have irritable bowel  syndrome.   REVIEW OF SYMPTOMS:  Some chills, sweats, some mild weight loss (a few  pounds over the past few weeks).  She is not a drinker or a smoker.  All  other systems appear negative other than what is mentioned above.   PHYSICAL EXAMINATION:  GENERAL:  An overweight young, black woman in no  acute distress.  She is sleepy.  VITAL SIGNS:  Temperature maximum 102.7, blood pressure 117/62, pulse 115,  respirations 18.  HEENT:  Anicteric.  Mouth free of lesions.  Moist.  NECK:  Supple.  No mass.  CHEST:  Clear.  HEART:  S1 and S2.  No murmurs or gallops.  ABDOMEN:  Soft, diffusely tender.  Worse in the lower abdomen.  RECTAL:  Declined.  EXTREMITIES:  No clubbing, cyanosis, or edema.  She has exquisite tenderness  of  the right humeral area in the bicipital groove.  There is no obvious  swelling there.  She says she has tendonitis.  NEUROLOGICAL:  She is alert and oriented x 3.  SKIN:  Areas inspected show no obvious rash.   LABORATORY DATA:  Hemoglobin 10.5, white count 8.8, platelets 569,000, MCV  78.  BMET normal except for potassium of 3.2.  Amylase and lipase normal.  LFTs normal.  Stool shows wbc's 5.   ASSESSMENT:  Subacute colitis, probably has Crohn's disease or ulcerative  colitis based upon the history.  It is difficult to sort all of this out.  It would be very important to get those old records.  Other possibilities  include infectious colitis with bacterial infection or C. difficile colitis.  I get no history of antibiotic use over the past few months.   PLAN:  1.  Continue care as you are.  2.  Once I am a little more certain of this diagnosis of IBD, which I think      she has, we can start steroids.  3.  May require endoscopic investigation depending upon the records.  We      will try to find those.       CEG/MEDQ  D:  01/29/2004  T:  01/29/2004  Job:   [id number]   cc:   Emeline General. White, M.D.  [address] [address]  [place] [address]  Fax: [phone]

## 2010-06-19 NOTE — Op Note (Signed)
[name] [name], [name] [name]                ACCOUNT NO.:  [id number]   MEDICAL RECORD NO.:  [id number]          PATIENT TYPE:  AMB   LOCATION:  SDC                           FACILITY:  [facility]   PHYSICIAN:  Diona Foley, M.D.   DATE OF BIRTH:  11/13/1970   DATE OF PROCEDURE:  03/18/2006  DATE OF DISCHARGE:                               OPERATIVE REPORT   PREOPERATIVE DIAGNOSES:  1. Menometrorrhagia.  2. Endometrial polyp.   POSTOPERATIVE DIAGNOSES:  1. Menometrorrhagia.  2. Endometrial polyp.   PROCEDURES:  1. Hysteroscopy D&C.  2. Resection of endometrial polyp.  3. NovaSure endometrial ablation.   SURGEON:  Diona Foley, M.D.   ASSISTANT:  None.   ANESTHESIA:  General.   COMPLICATIONS:  None.   BLOOD LOSS:  Minimal.   FLUID DEFICIT:  250 mL.   OPERATIVE FINDINGS:  A large, approximately 1.5 cm endometrial polyp  originating from the right side of the endometrial cavity, otherwise  normal appearing uterine cavity. NovaSure settings sound length 10 cm,  cervical length 3.5 cm, cavity length 6.5 cm, cavity width 4.5, power  161 x65 seconds.   SPECIMENS:  Endometrial curettings and polyps sent to pathology.   INDICATIONS:  This is a 40 year old African American female with a  history of menometrorrhagia, found to have an endometrial polyp on  ultrasound and presents today for hysteroscopy D&C, resection of  endometrial polyp and NovaSure endometrial ablation.  Prior to the  procedure, the risks, benefits and alternatives were discussed with  patient in detail.  We discussed the risks which include, but are not  limited to, hemorrhage requiring transfusion, infection, uterine  perforation which could cause injury to other intra-abdominal organs  which would require surgery through the abdomen and possible  hysterectomy.  Also discussed risks of anesthesia.  The patient voiced  understanding of the above and desires to proceed.  Informed consent was  obtained and all  questions answered before proceeding to the OR.   PROCEDURE:  The patient was taken to the operating room where she was  given a general anesthetic.  She was then prepped and draped in the  usual sterile fashion and placed in the dorsal lithotomy position.   Dictation ended at this point.      Diona Foley, M.D.  Electronically Signed     JW/MEDQ  D:  03/18/2006  T:  03/18/2006  Job:  [id number]

## 2010-06-19 NOTE — Assessment & Plan Note (Signed)
[name] OFFICE NOTE   [name] [name], [name] [name]                         MRN:          [id number]  DATE:04/19/2006                            DOB:          05-16-1970    CHIEF COMPLAINT:  Follow up of ulcerative colitis.   HISTORY:  She is doing well. She says that if she misses her medications  she will get diarrhea, but she seems to be doing alright. She did have a  gynecologic procedure to try to remove her uterine polyp, but  unfortunately, it sounds like she is going to need a hysterectomy, she  says. She has been anemic. She thinks her hemoglobin was 9.6 when she  was checked in the nursing care recently. I had her at 8.9 in October of  2007.   Her medications are listed and reviewed in the chart.   PAST MEDICAL HISTORY:  Reviewed and unchanged from previous notes. See  March 03, 2006.   See medical history and physical form for other details of this visit.   Her weight is 183 pounds, pulse 108 and then rechecked at 80, blood  pressure 118/60.   ASSESSMENT:  Ulcerative colitis and chronic anemia. I think the anemia  is probably multifactorial.   PLAN:  She is due for a surveillance colonoscopy. We will set that up.  We will check a CBC to follow up her anemia and advise further.  Otherwise, continue on her Asacol at 1200 mg 3 times a day, b.i.d.  dosing is okay, too. Continue Nexium for heartburn/reflux problems.     Gatha Mayer, MD,FACG  Electronically Signed    CEG/MedQ  DD: 04/20/2006  DT: 04/21/2006  Job #: [id number]   cc:   [name] [name], M.D.  [name] [name], M.D.  [name] [name], M.D.

## 2010-06-19 NOTE — Assessment & Plan Note (Signed)
[place] OFFICE NOTE   WM, FRUCHTER                         MRN:          [id number]  DATE:11/09/2005                            DOB:          [birth year]    CHIEF COMPLAINT:  Follow up of ulcerative colitis.   HISTORY OF PRESENT ILLNESS:  She ran out of her Asacol but got back on it  and is doing well.  She had a flare with diarrhea and abdominal pain, but  for the past couple of weeks after a two week hiatus from her medication,  she is better.  When I last saw her, we had her on nine Asacol tablets a  day, but now she is taking six in the morning.  I specifically instructed  her to take it twice a day versus three times a day in the past.  No  bleeding is recorded.  I have not seen her since January.  In January, she  had a TSH of 74, and tells me she has not followed up with Dr. Brigitte Pulse lately  and that her thyroid medication was adjusted, but it had not been rechecked  in some time.   MEDICATIONS:  1. Asacol 400 mg six tablets daily.  2. Protonix 40 mg daily.  3. Synthroid 0.137 mg daily.  4. Nitrofuran every other day.  5. Insulin (Lantus) 10 units at bedtime.  6. She often uses prednisone on her own, intermittently, p.r.n.   ALLERGIES:  NONE KNOWN.   PHYSICAL EXAMINATION:  GENERAL:  Young black female in no acute distress.  VITAL SIGNS:  Weight 182 pounds, pulse 78, blood pressure 110/70.  HEENT:  Eyes:  Anicteric.  LUNGS:  Clear.  HEART:  S1, S2.  No murmurs, rubs or gallops.  ABDOMEN:  Soft, nontender.  No organomegaly or mass.  Mildly obese.  EXTREMITIES:  No edema.  SKIN:  Warm, dry and no rash.  NEUROLOGICAL:  Alert and oriented x3.   ASSESSMENT:  1. Ulcerative colitis, under control now.  She has a patchy disease.  I      have labeled it as ulcerative colitis.  It could be indeterminate      colitis versus Crohn's disease.  2. Noncompliance.  3. Hypothyroidism, question if  this has been followed up recently.   PLAN:  1. Continue Asacol.  I wanted her to take three b.i.d.  We usually      compromise on t.i.d. to b.i.d., but not all in a daily dose.  Doristine Johns is      an option, but it is more expensive.  2. Return to see me in six months.  3. CBC, C-met, TSH should be checked  I wanted her to do this today, but      she had to leave and get to the bank and promises she will come back.  4. Further plans pending the results and response to therapy.  She should      follow up with Dr.  Brigitte Pulse as well and was advised to do so.   Note:  Our labs  would show up in E chart so Dr. Brigitte Pulse could view them there,  but I will try to forward him a copy, assuming she returns.       Gatha Mayer, MD,FACG      CEG/MedQ  DD:  11/09/2005  DT:  11/11/2005  Job #:  [id number]   cc:   Lutricia Feil, M.D.

## 2010-06-19 NOTE — Discharge Summary (Signed)
[name] [name]                ACCOUNT NO.:  [id number]   MEDICAL RECORD NO.:  [id number]          PATIENT TYPE:  INP   LOCATION:  5730                         FACILITY:  [place]   PHYSICIAN:  Jerelene Redden, MD      DATE OF BIRTH:  1970-06-02   DATE OF ADMISSION:  01/28/2004  DATE OF DISCHARGE:                                 DISCHARGE SUMMARY   [name] [name] is a 40 year old lady who initially presented to [facility] Emergency  Room on December 28 with persistent loose stools and cramping abdominal  pain.  She reported that she had been having about six stools per day for  the last month with some blood and mucus present.  She had also had  worsening abdominal pain for about two weeks described as a band-like  cramping feeling that moved across the abdomen from the left to the right.  In the emergency room, on December 28, the patient underwent a CT scan of  the abdomen and pelvis which showed extensive edema and inflammation of the  colon extending from the hepatic flexure through the transverse colon and  involving the splenic flexure.  There appeared to be sparing of the  ascending and descending portions of the colon.  The terminal ileum appeared  normal.  These findings were felt to be consistent with a nonspecific  inflammatory colitis.  A chest x-ray was also obtained in the emergency room  and was negative.  In light of these findings, the patient was admitted by  Dr. Minette Headland.   PHYSICAL EXAMINATION:  GENERAL:  Her physical examination at the time of  admission revealed a young woman who was in moderate distress.  VITAL SIGNS: Temperature was 101.3, pulse 109, blood pressure 117/71,  respiratory rate 18, O2 saturation 97%.  HEENT:  Within normal limits.  CHEST:  Clear to auscultation.  CARDIOVASCULAR:  Normal S1 and S2.  The patient was noted to be tachycardic.  There were no rubs or gallops.  ABDOMEN:  The abdomen was obese.  Bowel sounds were present.  The patient  was diffusely tender, particularly in the left upper quadrant.  EXTREMITIES:  Examination of the extremities revealed no cyanosis or edema.   LABORATORY DATA:  Relevant laboratory studies obtained included a CBC which  revealed a hemoglobin of 10.5.  BMET showed potassium of 3.2, and total iron  binding capacity was consistent with iron deficiency.  Reticulocyte count  was 2.7%.  Giardia antigen was negative.  B12 was normal. Stools for occult  blood were positive.  Stool cultures were negative for enteric pathogens.  Stools were obtained for Clostridium difficile x2 and were negative.   HOSPITAL COURSE:  On admission the patient was placed on IV Cipro and Flagyl  and was given vigorous IV hydration.  Consultation was obtained with Dr.  Carlean Purl of the [facility].  Dr. Carlean Purl was able to  review records from Dr. Kelby Fam office and found that the patient had been  previously diagnosed with  ulcerative colitis on the basis of  flexible  sigmoidoscopy done in 1999.  The patient  was apparently initially treated  with cortisone enemas and possible Asacol and was subsequently lost to  follow up.  Dr. Carlean Purl confirmed this information and, therefore,  recommended starting the patient on intravenous steroids.  The patient  received dose of Solu-Medrol and then was placed on prednisone 40 mg daily.  She was also started on Asacol in appropriate doses.  Her blood count was  monitored.  The last CBC obtained was January 1 and was 9.2.  This was  stable.  [name] [name] slowly responded to treatment. Her abdominal pain  improved.  Her appetite increased.  Her diarrhea decreased.  [name] [name]  seemed to be very frightened and anxious about her condition and was very  reluctant to agree to discharge.  I discussed with her on several occasions  the fact that ulcerative colitis was a chronic condition and that it would  require regular visits with a doctor along with taking regular  medications.  I told her that she should anticipate that she would have episodes of  diarrhea and cramping abdominal pain for some time, but that this should  gradually improve over a long period of time.  Eventually [name] [name] was  discharged.   DISCHARGE MEDICATIONS:  1.  Pepcid 20 mg daily.  2.  Synthroid 88 mcg daily.  3.  Asacol 800 mg t.i.d.  4.  Prednisone 40 mg daily.   FOLLOW UP:  A followup appointment will be made with Dr. Carlean Purl in two  weeks.  The patient will also continue to follow up with Dr. Harlan Stains  on a p.r.n. basis.   CONDITION ON DISCHARGE:  Fair.  It should be pointed out that the patient  had a total of two negative stools for enteric pathogens during her hospital  stay.       SY/MEDQ  D:  02/03/2004  T:  02/03/2004  Job:  [id number]   cc:   Emeline General. White, M.D.  [place]  [place]  [place] [address]  Fax: [phone]   Gatha Mayer, M.D. [facility]

## 2010-06-19 NOTE — H&P (Signed)
[name], [name]                ACCOUNT NO.:  [id number]   MEDICAL RECORD NO.:  [id number]          PATIENT TYPE:  INP   LOCATION:  5730                         FACILITY:  [facility]   PHYSICIAN:  Sheila Oats, M.D.DATE OF BIRTH:  Jun 20, 1970   DATE OF ADMISSION:01/29/2004  DATE OF DISCHARGE:                                HISTORY & PHYSICAL   PRIMARY CARE PHYSICIAN:  Emeline General. White, M.D.   CHIEF COMPLAINT:  Persistent diarrhea and abdominal pain with nausea and  vomiting.   HISTORY OF PRESENT ILLNESS:  The patient is a 40 year old, black female with  a past medical history significant for irritable bowel syndrome and  hypothyroidism who presents with the above complaints.  She states she has  had diarrhea  x1 one month on average about six stools per day with some  blood and mucus, and she has had abdominal pain for the past two weeks.  She  states the abdominal pain has two components to  it, a sharp and constant  pain that is about 7-8 in intensity and diffuse as well as cramping pain  that moves across her abdomen and sometimes to the left side of her back and  is 9 out of 10 in intensity.  The patient states she saw Dr. Nancy Fetter about a  week ago and was given a prescription for anti-emetic which did not help her  and because the symptoms persisted she came to the ER on the day prior to  this presentation and was discharged with a prescription for Cipro, Vicodin  and Phenergan after a stool sample was sent for cultures.  The patient  reports that after she left the ER she became nauseated and vomited 5 to 6  times, was unable to keep the medication so returned to the ER today.   In the ER evaluation included a CT scan of the abdomen which showed  nonspecific colitis extending from the hepatic to the splenic flexure.  The  patient is admitted to the [facility] hospitalist service for further evaluation  and management.   PAST MEDICAL HISTORY:  As stated above.  Also, history of  shoulder  tendinitis.   MEDICATIONS:  1.  Synthroid 0.88 mg daily.  2.  Cipro.  3.  Vicodin.  4.  Phenergan.   ALLERGIES:  NKDA.   SOCIAL HISTORY:  Denies alcohol, also denies tobacco, no illicit drugs.   FAMILY HISTORY:  An aunt has hypertension, her grandmother has diabetes  mellitus and her paternal grandmother had cervical cancer.   REVIEW OF SYSTEMS:  As per HPI, other comprehensive review of systems is  negative.   PHYSICAL EXAMINATION:  GENERAL: The patient is a young, black female in  moderate distress secondary to abdominal discomfort.  VITAL SIGNS:  Temperature 101.3, pulse 109, initially 123.  Blood pressure  117/71, respiratory rate 18.  O2 sat 97%.  HEENT:  PERRL, EOMI, sclerae anicteric, slightly dry mucus membranes, no  oral exudates.  NECK:  Supple, no adenopathy, no thyromegaly, no carotid bruits appreciated.  LUNGS:  Clear to auscultation bilaterally.  No crackles or wheezes.  CARDIOVASCULAR:  Tachycardic, normal S1, S2, no murmur appreciated.  ABDOMEN: Obese, soft, bowel sounds present, diffusely tender with  predominant tenderness over the left upper quadrant, no rebound tenderness.  No masses palpable.  EXTREMITIES:  Right upper extremity with decreased range of motion in the  right shoulder, mild tenderness with movement.  Lower extremities with no  clubbing, cyanosis or edema.  NEUROLOGIC:  Alert and oriented x3.  Cranial nerves II-XII were grossly  intact.  Nonfocal exam.   LABORATORY DATA:  White cell count is 96, hemoglobin 10, hematocrit 30.6,  platelet count 611, neutrophil count 74%.  Sodium 134, potassium 4.3,  chloride 102, BUN 7, glucose 123.  Her pH 7.42, pCO2 40.1, bicarb 26.4. CT  scan of the abdomen and pelvis, nonspecific colitis extending from hepatic  to splenic flexure.  There is a 16 mm cyst on the right ovary, left ovary  appears normal as does the uterus.  No free fluid in the pelvis.  Stool WBC  from December 26 positive, stool  cultures from December 26 re-incubated.   ASSESSMENT AND PLAN:  1.  Abdominal pain with nausea, vomiting and persistent diarrhea.  I will      obtain stool and blood cultures as well as stool guaiac.  Will start      empiric IV antibiotics, Cipro and Flagyl and follow above studies.  I      will also obtain urine culture and sensitivity as well as serum amylase      and lipase.  Pain management and anti-emetics.  Consider GI versus      surgical consult as clinically appropriate.  2.  Hypothyroidism.  Follow, resume Synthroid.  3.  Anemia.  Check stool guaiac and anemia work-up.  Monitor H and H, type      and screen.  4.  History of irritable bowel syndrome.  5.  Volume depletion, IV hydration.        ___________________________________________  Sheila Oats, M.D.    ACV/MEDQ  D:  01/29/2004  T:  01/29/2004  Job:  [id number]   cc:   Emeline General. White, M.D.  [address]  [place] [address]  Fax: [phone]

## 2010-06-19 NOTE — Discharge Summary (Signed)
NAMEJALYN, [name] [name]                ACCOUNT NO.:  [id number]   MEDICAL RECORD NO.:  [id number]          PATIENT TYPE:  INP   LOCATION:  [place]                         FACILITY:  [facility]   PHYSICIAN:  Melissa L. Lovena Le, MD  DATE OF BIRTH:  08/31/1970   DATE OF ADMISSION:  02/14/2004  DATE OF DISCHARGE:  02/18/2004                           DISCHARGE SUMMARY - REFERRING   ADMISSION DIAGNOSES:  1.  Hyperglycemia on steroids to treat her gastrointestinal tract.  2.  Ulcerative colitis reasonably controlled.  3.  Anemia secondary to gastrointestinal loss as related to #2.   DISCHARGE DIAGNOSES:  1.  Hyperglycemia secondary to steroids which the patient will continue to      need to be on because of her ulcerative colitis.  The patient was      started on Lantus, long acting as well as NovoLog short acting and given      diabetic education in the hospital.  She will be scheduled for      outpatient diabetes education.  Please note during the hospital stay, a      hemoglobin A1C was not ordered.  At this time, I do not feel that      patient needs to stay in the hospital in order to obtain this follow-up      value.  We will recommend that her primary care physician obtain a      hemoglobin A1C as an outpatient and incorporate this into her follow-up      care.  2.  Ulcerative colitis.  We will recommend follow-up with [facility] in the      next two weeks to taper her prednisone.  She will continue with her      mesalamine 800 mg three times daily.  3.  Anemia.  The patient is displaying relatively stable hemoglobin and      hematocrit.  Her last values reveal a hemoglobin of 8.9 and 27.3.  She      will be restarted on her iron orally and followed as an outpatient by      her primary care physician.  4.  Knee pain.  The patient began complaining of knee pain three days prior      to discharge.  X-rays of the knee showed no obvious radiological      findings.  She has no effusions and no  joint space disease at this time.      I will recommend that she follow up as an outpatient for possible      rheumatological evaluation.  The patient states that in the past she has      seen a rheumatologist for multiple complaints of arthritis.  This may be      related to her ulcerative colitis and she may need a reconsultation to      establish future care.  At this time, on the day of discharge, we will      continue her Toradol for 24 more hours and then convert her back to      Advil or Tylenol.  5.  Perineal irritation  and lesion.  The patient has an obvious ulceration      involving the head of her clitoris.  I suspect that this is a recurrence      of her genital herpes, which the patient explained as historical.  I      will start her on some Famvir and have made an appointment for her to      see the nurse practitioner at [facility], Kendall Flack, on      February 20, 2004, at 10:30 a.m.  I am recommending an internal      examination as well as a more extensive external examination to      determine whether this truly is a flare.  At this time, she has no      rectal lesions but has no rectal lesions but does have a mild irritation      of the skin related to frequent stooling.  Will recommend generalized      cleansing with soap and water and patting the area dry.   DISCHARGE MEDICATIONS:  1.  Protonix 40 mg b.i.d. while on medications for pain.  The patient can be      converted back to her Pepcid 20 mg daily when she is off the NSAIDs.  2.  Ferrous sulfate 325 mg b.i.d.  3.  Synthroid 88 mcg once daily q.a.m.  4.  Mesalamine 800 mg t.i.d.  5.  Multivitamin one daily.  6.  Prednisone 30 mg daily.  7.  Toradol 20 mg q.4-6h. p.r.n. for knee pain x24 more hours.  8.  Lantus 15 units subcutaneously at bedtime.  9.  Novolog sliding scale coverage starting at 121 to 180 fasting blood      sugar she is to get 2 units, 181 to 200 she is get 4 units, 201 to 250      she  is to get 6 units, 251 to 300 she is to get 10 units, 301 to 350 she      is to get 12 units.  For blood sugars greater than 400, she is to call      her primary care physician.  10. Famvir 125 mg b.i.d. x5 days.   PAIN MANAGEMENT:  The patient will continue Toradol x24 more hours and then  convert to Advil or Tylenol for her knee pain.  As stated, will recommend  follow-up rheumatological evaluation to evaluate for extra intestinal  manifestations of her ulcerative colitis.   ACTIVITY:  The patient is to avoid sexual intimacy until she sees her GYN to  establish whether this is indeed a flare of genital herpes.   DIET:  The patient has been given nutritional information on diabetes as  well as her ulcerative colitis and should follow along with the written  literature.   WOUND CARE:  For her genital lesion, she is to use soap and water to cleanse  her genital area and pat this dry.   FOLLOW UP:  She is to see Kendall Flack at [facility] at 10:30  Thursday, February 20, 2004.  Nursing staff will set up a diabetic education  outpatient appointment before discharge.  She is to see Dr. Dema Severin in one  week and is to schedule that appointment for herself.  I have recommended  that she follow up with [facility] in the next two weeks so that she can  have her prednisone tapered.   HISTORY OF PRESENT ILLNESS:  The patient is a 40 year old African  American  female with known history of ulcerative colitis.  She was being seen in the  outpatient office by her gastroenterologist when it was noted that she was  hyperglycemic with a blood glucose of 466 noted in the emergency room.  The  patient was sent to the hospital for further evaluation of hyperglycemia.  Please note, the patient has been on 40 mg of prednisone a day since  December and recently had her dose tapered down to 30.  She has had improvement in her diarrhea on the prednisone and states that yesterday she  did have a formed  stool.  The patient was admitted to the general medical  floor.  Initially she was started on a Glucommander.  This was discontinued  in favor of using Lantus and Novolog.  The patient responded favorably to  this regime with decreases in blood glucose to the mid 200s and ultimately  into the 100s on discharge.  The patient was seen by the diabetic treatment  team who recommended continuing her Lantus as well as Novolog.  She is going  to be scheduled for outpatient diabetic education and I am recommending a  hemoglobin A1C as an outpatient as at this time she is stable for discharge  and there is no need to keep her in the hospital to evaluate a value that  can be obtained as an outpatient.  The patient will be given an insulin  starter kit and has been provided with a form to order to [facility] as an  outpatient.   Please note during the course of the hospital stay, the patient complained  of knee pain bilaterally.  She was started on Toradol for said knee pain  with good results.  I am recommended discontinuation of Narcotics with  continuation of Toradol for one more day and then the use of Tylenol or  Advil for her knee pain as well as a follow-up evaluation by rheumatologist.  Please note while the patient has been on NSAIDs, we did discontinue her  Pepcid and recommending Protonix b.i.d.  We have restarted her iron  supplementation.  On the day of discharge, her hemoglobin was 8.9 and 27.3  which has remained stable.   OTHER PERTINENT LABORATORY DATA DURING HOSPITAL STAY:  Her discharge BUN is  21, creatinine 0.7.  Her last known hemoglobin is 8.9.   On the day of discharge, the patient's vital signs are stable.  Her  temperature is 98.2, blood pressure 105/66, pulse varies from mid 90s to  100.2 which is sinus tachycardia, respiratory rate 16, saturation is 98%.  Blood sugars over the past 24 hours have been 132, 293, 276, 239 and 100.  General, she is in no acute distress.   Her pupils are equal, round and  reactive to light. Extraocular muscles are intact.  Moist mucous membranes.  Neck is supple.  There is no JVD, no lymphadenopathy.  Chest is clear to  auscultation.  There is no wheezing, rhonchi or rales.  Cardiovascular is  regular rate and rhythm, positive S1 and S2, no S3 or S4.  No murmurs, rubs,  or gallops are noted.  Abdomen is soft, nontender and nondistended with  positive bowel sounds.  There is no hepatosplenomegaly.  Extremities show no  clubbing, cyanosis, or edema.  Neurologically she is awake, alert and  oriented.  Cranial nerves II-XII are intact.  The patient indicated an area  of rash in her perineum.  Really what this consists of is a  small ulceration  on the tip of her clitoris.  It appears clean.  There are no surrounding  lesions at the rectum or perineal area.  I am therefore, recommending that we start her on Famvir for reactivation of her genital herpes with a follow-  up appointment in the next two days, scheduled on Thursday, February 20, 2004, at 10:30 with her gynecologist.   At this time I feel comfortable in discharging this patient to follow up as  an outpatient with her GI doctor, her gynecologist and her primary care  physician.   The patient is deemed stable for discharge to follow up, as stated, to Dr.  Dema Severin in the next week, to the [facility] folks in the next one to two weeks  and, as stated, her gynecologist in the next two days.  As stated in the  discharge instruction section, I am recommending a rheumatological consult  as an outpatient to follow up on recent joint pain which may be extra  gastrointestinal manifestations of her ulcerative colitis.  At this time,  there are no joint effusions, rash or indication for infection.  We will  therefore control her symptoms of pain with medication.      MLT/MEDQ  D:  02/18/2004  T:  02/18/2004  Job:  [id number]   cc:   Emeline General. White, M.D.  [address]  [place]  [place] [address]  Fax: [phone]   Gatha Mayer, M.D. [facility]   Kendall Flack, N.P.  [facility]   Kathrin Penner, M.D.  [address] [place]  [place] [address]

## 2010-11-17 LAB — COMPREHENSIVE METABOLIC PANEL
ALT: 10
AST: 14
Albumin: 3.3 — ABNORMAL LOW
Alkaline Phosphatase: 89
BUN: 11
CO2: 24
Calcium: 8.9
Chloride: 104
Creatinine, Ser: 0.8
GFR calc Af Amer: >60
GFR calc non Af Amer: >60
Glucose, Bld: 149 — ABNORMAL HIGH
Potassium: 3.6
Sodium: 135
Total Bilirubin: 0.4
Total Protein: 7.9

## 2010-11-17 LAB — BASIC METABOLIC PANEL
BUN: 9
CO2: 26
Calcium: 8.1 — ABNORMAL LOW
Chloride: 104
Creatinine, Ser: 0.73
GFR calc Af Amer: >60
GFR calc non Af Amer: >60
Glucose, Bld: 93
Potassium: 3.3 — ABNORMAL LOW
Sodium: 136

## 2010-11-17 LAB — CBC
HCT: 33.3 — ABNORMAL LOW
Hemoglobin: 10.9 — ABNORMAL LOW
MCHC: 32.7
MCV: 78.8
Platelets: 559 — ABNORMAL HIGH
RBC: 4.22
RDW: 19.2 — ABNORMAL HIGH
WBC: 11.9 — ABNORMAL HIGH

## 2010-11-17 LAB — SAMPLE TO BLOOD BANK

## 2010-11-18 LAB — URINALYSIS, ROUTINE W REFLEX MICROSCOPIC
Bilirubin Urine: NEGATIVE
Glucose, UA: NEGATIVE
Hgb urine dipstick: NEGATIVE
Ketones, ur: NEGATIVE
Nitrite: NEGATIVE
Protein, ur: NEGATIVE
Specific Gravity, Urine: 1.027
Urobilinogen, UA: 0.2
pH: 6

## 2010-11-18 LAB — CBC
HCT: 30.7 — ABNORMAL LOW
HCT: 35 — ABNORMAL LOW
Hemoglobin: 10.2 — ABNORMAL LOW
Hemoglobin: 11.2 — ABNORMAL LOW
MCHC: 32
MCHC: 33.1
MCV: 78.9
MCV: 79.3
Platelets: 384
Platelets: 441 — ABNORMAL HIGH
RBC: 3.89
RBC: 4.42
RDW: 18.9 — ABNORMAL HIGH
RDW: 19.6 — ABNORMAL HIGH
WBC: 10
WBC: 6.4

## 2010-11-18 LAB — COMPREHENSIVE METABOLIC PANEL
ALT: 10
AST: 13
Albumin: 3.4 — ABNORMAL LOW
Alkaline Phosphatase: 79
BUN: 15
CO2: 27
Calcium: 9.4
Chloride: 107
Creatinine, Ser: 0.76
GFR calc Af Amer: >60
GFR calc non Af Amer: >60
Glucose, Bld: 132 — ABNORMAL HIGH
Potassium: 4.2
Sodium: 140
Total Bilirubin: 0.4
Total Protein: 7.2

## 2010-11-18 LAB — PREGNANCY, URINE: Preg Test, Ur: NEGATIVE

## 2011-04-08 ENCOUNTER — Encounter: Payer: Self-pay | Admitting: Physician Assistant

## 2011-04-08 ENCOUNTER — Ambulatory Visit (INDEPENDENT_AMBULATORY_CARE_PROVIDER_SITE_OTHER): Payer: PRIVATE HEALTH INSURANCE | Admitting: Physician Assistant

## 2011-04-08 ENCOUNTER — Other Ambulatory Visit (INDEPENDENT_AMBULATORY_CARE_PROVIDER_SITE_OTHER): Payer: PRIVATE HEALTH INSURANCE

## 2011-04-08 ENCOUNTER — Telehealth: Payer: Self-pay | Admitting: Internal Medicine

## 2011-04-08 DIAGNOSIS — R112 Nausea with vomiting, unspecified: Secondary | ICD-10-CM

## 2011-04-08 DIAGNOSIS — K5289 Other specified noninfective gastroenteritis and colitis: Secondary | ICD-10-CM

## 2011-04-08 DIAGNOSIS — K529 Noninfective gastroenteritis and colitis, unspecified: Secondary | ICD-10-CM

## 2011-04-08 DIAGNOSIS — E119 Type 2 diabetes mellitus without complications: Secondary | ICD-10-CM

## 2011-04-08 LAB — CBC WITH DIFFERENTIAL/PLATELET
Eosinophils Relative: 0.5 % (ref 0.0–5.0)
HCT: 37 % (ref 36.0–46.0)
Hemoglobin: 12 g/dL (ref 12.0–15.0)
Lymphocytes Relative: 14.9 % (ref 12.0–46.0)
Lymphs Abs: 1.1 10*3/uL (ref 0.7–4.0)
Monocytes Relative: 5.5 % (ref 3.0–12.0)
Neutro Abs: 6 10*3/uL (ref 1.4–7.7)
RBC: 4.26 Mil/uL (ref 3.87–5.11)
WBC: 7.6 10*3/uL (ref 4.5–10.5)

## 2011-04-08 LAB — BASIC METABOLIC PANEL
CO2: 21 mEq/L (ref 19–32)
Calcium: 8.7 mg/dL (ref 8.4–10.5)
Chloride: 106 mEq/L (ref 96–112)
Creatinine, Ser: 0.6 mg/dL (ref 0.4–1.2)
Glucose, Bld: 119 mg/dL — ABNORMAL HIGH (ref 70–99)

## 2011-04-08 LAB — GLUCOSE, POCT (MANUAL RESULT ENTRY): POC Glucose: 101

## 2011-04-08 MED ORDER — ONDANSETRON HCL 4 MG PO TABS: ORAL_TABLET | ORAL | Status: DC

## 2011-04-08 NOTE — Progress Notes (Signed)
Subjective:    Patient ID: [name] [name], female    DOB: Sep 28, 1970, 41 y.o.   MRN: [id number]  HPI [name] [name] is a 41 year old Afro-American female known to Dr. Carlean Purl who has history of ulcerative colitis which has been primarily left sided. She is also an insulin-dependent diabetic. She was last seen here in November of 2010 and had had a flare of her colitis at that time which did respond to prednisone. Last colonoscopy was done in February of 2010 and she had an active proctitis at that time, plan was to followup colonoscopy in 2 years.  Since that time she has been on a Asacol 400 mg, 6 tablets daily and says she has done well. She comes in today as an urgent add-on with abrupt onset at 3:30 AM this morning with profuse watery diarrhea. She says stool is malodorous and large volume but nonbloody. She has had associated nausea and headache but no vomiting. She complains of feeling weak today but is not dizzy or lightheaded. She has not been on any recent antibiotics has not had any known sick exposures. She does say that this afternoon her diarrhea is decreasing some in frequency. She has not tried to keep anything down other than water.    Review of Systems  Constitutional: Positive for appetite change and fatigue.  HENT: Negative.   Eyes: Negative.   Respiratory: Negative.   Cardiovascular: Negative.   Gastrointestinal: Positive for nausea, abdominal pain and diarrhea.  Genitourinary: Negative.   Musculoskeletal: Negative.   Neurological: Positive for weakness.  Hematological: Negative.   Psychiatric/Behavioral: Negative.    Outpatient Encounter Prescriptions as of 04/08/2011  Medication Sig Dispense Refill  . insulin glargine (LANTUS) 100 UNIT/ML injection Inject 40 Units into the skin at bedtime.      Marland Kitchen levothyroxine (SYNTHROID) 137 MCG tablet Take 137 mcg by mouth daily.      . mesalamine (ASACOL) 400 MG EC tablet Take 6 capsules by mouth every night      . omeprazole (PRILOSEC) 20  MG capsule Take 2 tablets by mouth once daily      . simvastatin (ZOCOR) 20 MG tablet Take 20 mg by mouth every evening.      . ondansetron (ZOFRAN) 4 MG tablet Take 1 tab every 6 hours as needed for nausea.  15 tablet  0   No Known Allergies  Patient Active Problem List  Diagnoses  . HYPOTHYROIDISM  . DIABETES MELLITUS, TYPE II, UNCONTROLLED  . HYPERLIPIDEMIA  . ANEMIA-UNSPECIFIED  . ULCERATIVE COLITIS-LEFT SIDE  . DIARRHEA       Objective:   Physical Exam  well-developed African American female, ill-appearing but in no acute distress blood pressure 110/68 pulse 100. HEENT; nontraumatic normocephalic EOMI PERRLA sclera anicteric oral mucosa moist,Neck; Supple no JVD, Cardiovascular; regular rate and rhythm with S1-S2 no murmur or gallop, Pulmonary ;clear bilaterally, Abdomen; soft no focal tenderness, no guarding, no rebound ,no palpable mass or hepatosplenomegaly bowel sounds are hyperactive tongue Recta;l not done, Extremities; no clubbing, cyanosis or edema skin warm dry, Psych; mood and affect normal and appropriate.        Assessment & Plan:  #53 41 year old female with left-sided ulcerative colitis which has been quiescent  on maintenance Asacol therapy. #2 acute illness with profuse watery diarrhea ,nausea,  Headache and weakness. I suspect she has an acute viral gastroenteritis. Clinically she is stable and does not appear to be dehydrated #3 insulin-dependent diabetes mellitus.  Plan; Will check CBC and BMET today Patient  is advised to go home to rest and push fluids over the next 24 hours, she is to gradually advanced to soft bland foods Start Zofran 4 mg every 6 hours as needed for nausea Immodium over-the-counter as needed for diarrhea Patient is advised that if she is not improved and able to take liberal fluids by midday tomorrow she should go to the emergency room for hydration ,especially given that she is an insulin-dependent diabetic.

## 2011-04-08 NOTE — Patient Instructions (Addendum)
Please go to the basement level to have your labs drawn.   Home to rest. Push Fluids, clear liquids. Take Imodium AD  Over the counter for diarrhea. Follow label instructions. We sent a prescription for Zofran to [facility] to take Asacol 6 tab daily.   If not better by noon tomorrow Firday 04-09-2011, you may need to go to the Emergency Department for fluids.  We did make you an appointment with Dr. Carlean Purl for 05-10-2011 @ 3:45PM.

## 2011-04-08 NOTE — Telephone Encounter (Signed)
Patient has not been seen since October of 2010.  She has a history of UC .  She is still on asacol, but [facility].  She is asking for a rx for her flare up.  I have advised her she will need to be seen in the office for [place] to treat her.  I have offered her an appt, but she says she needs to find out more about her insurance co-pay before she schedules an appt. She will call back if she wants the appt.

## 2011-04-08 NOTE — Telephone Encounter (Signed)
Must re-establish care before prescribing

## 2011-04-08 NOTE — Telephone Encounter (Signed)
Patient will come in today and see Amy Esterwood PA today at 3:00

## 2011-04-09 ENCOUNTER — Encounter: Payer: Self-pay | Admitting: Gastroenterology

## 2011-04-09 NOTE — Progress Notes (Signed)
Reviewed and agree with management. Coda Filler D. Romir Klimowicz, M.D., FACG  

## 2011-05-10 ENCOUNTER — Ambulatory Visit: Payer: PRIVATE HEALTH INSURANCE | Admitting: Internal Medicine

## 2011-06-09 ENCOUNTER — Emergency Department (INDEPENDENT_AMBULATORY_CARE_PROVIDER_SITE_OTHER)
Admission: EM | Admit: 2011-06-09 | Discharge: 2011-06-09 | Disposition: A | Discharge status: Home / Self Care | Payer: PRIVATE HEALTH INSURANCE | Source: Home / Self Care | Attending: Emergency Medicine | Admitting: Emergency Medicine

## 2011-06-09 ENCOUNTER — Encounter (HOSPITAL_COMMUNITY): Payer: Self-pay

## 2011-06-09 ENCOUNTER — Emergency Department (INDEPENDENT_AMBULATORY_CARE_PROVIDER_SITE_OTHER): Payer: PRIVATE HEALTH INSURANCE

## 2011-06-09 DIAGNOSIS — M436 Torticollis: Secondary | ICD-10-CM

## 2011-06-09 HISTORY — DX: Fibromyalgia: M79.7

## 2011-06-09 MED ORDER — OXYCODONE-ACETAMINOPHEN 5-325 MG PO TABS
2.0000 | ORAL_TABLET | ORAL | Status: AC | PRN
Start: 2011-06-09 — End: 2011-06-19

## 2011-06-09 MED ORDER — DICLOFENAC SODIUM 75 MG PO TBEC: 75.0000 mg | DELAYED_RELEASE_TABLET | Freq: Two times a day (BID) | ORAL | Status: AC

## 2011-06-09 MED ORDER — DIAZEPAM 5 MG PO TABS: 5.0000 mg | ORAL_TABLET | Freq: Two times a day (BID) | ORAL | Status: AC

## 2011-06-09 MED ORDER — KETOROLAC TROMETHAMINE 60 MG/2ML IM SOLN
60.0000 mg | Freq: Once | INTRAMUSCULAR | Status: AC
  Administered 2011-06-09: 60 mg via INTRAMUSCULAR

## 2011-06-09 MED ORDER — KETOROLAC TROMETHAMINE 60 MG/2ML IM SOLN
INTRAMUSCULAR | Status: AC
  Filled 2011-06-09: qty 2

## 2011-06-09 MED ORDER — KETOROLAC TROMETHAMINE 60 MG/2ML IM SOLN: 60.0000 mg | Freq: Once | INTRAMUSCULAR | Status: DC

## 2011-06-09 NOTE — ED Provider Notes (Signed)
History     CSN: [id number]  Arrival date & time 06/09/11  7482   First MD Initiated Contact with Patient 06/09/11 [phone]      Chief Complaint  Patient presents with  . Headache  . Neck Pain    (Consider location/radiation/quality/duration/timing/severity/associated sxs/prior treatment) Patient is a 41 y.o. female presenting with neck injury. The history is provided by the patient. No language interpreter was used.  Neck Injury This is a new problem. The current episode started more than 2 days ago. The problem occurs constantly. The problem has not changed since onset.The symptoms are aggravated by nothing. The symptoms are relieved by nothing. She has tried nothing for the symptoms. The treatment provided moderate relief.   Pt complains of neck pain.   Past Medical History  Diagnosis Date  . Left sided ulcerative colitis   . Anemia   . IDDM (insulin dependent diabetes mellitus)   . Grave's disease   . Hypothyroid   . Endometriosis   . Sleep apnea   . GERD (gastroesophageal reflux disease)   . 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 Diabetes mellitus   . Fibromyalgia     Past Surgical History  Procedure Date  . Partial hysterectomy 07/2006  . Colonoscopy 03/2008    Family History  Problem Relation Age of Onset  . Cervical cancer Paternal Grandmother   . Diabetes Maternal Grandmother   . Heart disease Maternal Grandmother   . Heart disease Maternal Grandfather   . Colon cancer Neg Hx   . Kidney disease Paternal Grandmother     ???    History  Substance Use Topics  . Smoking status: Never Smoker   . Smokeless tobacco: Never Used  . Alcohol Use: No    OB History    Grav Para Term Preterm Abortions TAB SAB Ect Mult Living                  Review of Systems  Musculoskeletal: Positive for back pain.  All other systems reviewed and are negative.    Allergies  Review of patient's allergies indicates no known allergies.  Home Medications   Current Outpatient Rx  Name  Route Sig Dispense Refill  . FLEXERIL PO Oral Take by mouth.    Marland Kitchen HYDROCODONE-ACETAMINOPHEN PO Oral Take by mouth.    . INSULIN GLARGINE 100 UNIT/ML  SOLN Subcutaneous Inject 40 Units into the skin at bedtime.    Marland Kitchen LEVOTHYROXINE SODIUM 137 MCG PO TABS Oral Take 137 mcg by mouth daily.    Marland Kitchen MESALAMINE 400 MG PO TBEC  Take 6 capsules by mouth every night    . OMEPRAZOLE 20 MG PO CPDR  Take 2 tablets by mouth once daily    . ONDANSETRON HCL 4 MG PO TABS  Take 1 tab every 6 hours as needed for nausea. 15 tablet 0  . SIMVASTATIN 20 MG PO TABS Oral Take 20 mg by mouth every evening.      BP 120/82  Pulse 106  Temp(Src) 98 F (36.7 C) (Oral)  Resp 18  SpO2 98%  Physical Exam  Vitals reviewed. Constitutional: She is oriented to person, place, and time. She appears well-developed and well-nourished.  HENT:  Head: Normocephalic and atraumatic.  Right Ear: External ear normal.  Left Ear: External ear normal.  Eyes: Conjunctivae are normal. Pupils are equal, round, and reactive to light.  Neck: Normal range of motion. Neck supple.       Tender cervical spine diffusely  Cardiovascular: Normal rate.  Musculoskeletal: Normal range of motion.  Neurological: She is alert and oriented to person, place, and time.  Skin: Skin is warm.    ED Course  Procedures (including critical care time)  Labs Reviewed - No data to display Dg Cervical Spine Complete  06/09/2011  *RADIOLOGY REPORT*  Clinical Data: Headache, neck pain and stiffness  CERVICAL SPINE - COMPLETE 4+ VIEW  Comparison: None  Findings: Prevertebral soft tissues normal thickness. Vertebral body and disc space heights maintained. Osseous mineralization grossly normal. No acute fracture, subluxation, or bone destruction. Jewelry artifact projects over C1-C2 on the lateral view. Osseous foramina patent. C1-C2 alignment normal.  IMPRESSION: No acute osseous abnormalities.  Original Report Authenticated By: Burnetta Sabin, M.D.     No  diagnosis found.    MDM  Pt given torodol IM.   Pt advised to follow up with her Physician.     Pt given rx for 10 percocet.   Pt advised to see her Physician for recheck        [place], [place] 06/09/11 [place], [place] 06/09/11 [phone]

## 2011-06-09 NOTE — Discharge Instructions (Signed)
Torticollis, Acute You have suddenly (acutely) developed a twisted neck (torticollis). This is usually a self-limited condition. CAUSES  Acute torticollis may be caused by malposition, trauma or infection. Most commonly, acute torticollis is caused by sleeping in an awkward position. Torticollis may also be caused by the flexion, extension or twisting of the neck muscles beyond their normal position. Sometimes, the exact cause may not be known. SYMPTOMS  Usually, there is pain and limited movement of the neck. Your neck may twist to one side. DIAGNOSIS  The diagnosis is often made by physical examination. X-rays, CT scans or MRIs may be done if there is a history of trauma or concern of infection. TREATMENT  For a common, stiff neck that develops during sleep, treatment is focused on relaxing the contracted neck muscle. Medications (including shots) may be used to treat the problem. Most cases resolve in several days. Torticollis usually responds to conservative physical therapy. If left untreated, the shortened and spastic neck muscle can cause deformities in the face and neck. Rarely, surgery is required. HOME CARE INSTRUCTIONS   Use over-the-counter and prescription medications as directed by your caregiver.   Do stretching exercises and massage the neck as directed by your caregiver.   Follow up with physical therapy if needed and as directed by your caregiver.  SEEK IMMEDIATE MEDICAL CARE IF:   You develop difficulty breathing or noisy breathing (stridor).   You drool, develop trouble swallowing or have pain with swallowing.   You develop numbness or weakness in the hands or feet.   You have changes in speech or vision.   You have problems with urination or bowel movements.   You have difficulty walking.   You have a fever.   You have increased pain.  MAKE SURE YOU:   Understand these instructions.   Will watch your condition.   Will get help right away if you are not  doing well or get worse.  Document Released: 01/16/2000 Document Revised: 01/07/2011 Document Reviewed: 02/26/2009 [facility] Patient Information 2012 [place].

## 2011-06-09 NOTE — ED Notes (Signed)
C/o headache and neck pain since Monday evening.  States pain is dull ache and sharp shooting pain with movement.  States head is sore to lie down.  States she went to an Urgent Care last night- they gave her flexeril and hydrocodone which has not helped.  Denies fever, n/v or other sx.

## 2011-06-11 NOTE — ED Provider Notes (Signed)
Dx: torticollis  Medical screening examination/treatment/procedure(s) were performed by non-physician practitioner and as supervising physician I was immediately available for consultation/collaboration.  Cherly Beach MD   Cherly Beach, MD 06/11/11 1330

## 2011-10-21 ENCOUNTER — Encounter: Payer: Self-pay | Admitting: Internal Medicine

## 2014-08-12 ENCOUNTER — Encounter: Payer: Self-pay | Admitting: Internal Medicine

## 2014-08-12 ENCOUNTER — Encounter: Payer: Self-pay | Admitting: Gastroenterology

## 2015-12-12 ENCOUNTER — Ambulatory Visit (INDEPENDENT_AMBULATORY_CARE_PROVIDER_SITE_OTHER): Payer: No Typology Code available for payment source | Admitting: Internal Medicine

## 2015-12-12 ENCOUNTER — Encounter: Payer: Self-pay | Admitting: Internal Medicine

## 2015-12-12 VITALS — BP 118/78 | HR 82 | Ht 61.0 in | Wt 212.0 lb

## 2015-12-12 DIAGNOSIS — K51819 Other ulcerative colitis with unspecified complications: Secondary | ICD-10-CM

## 2015-12-12 NOTE — Progress Notes (Signed)
[name] [name] 45 y.o. 1970/10/02 [id number]  Assessment & Plan:   Encounter Diagnosis  Name Primary?  . Other ulcerative colitis with complication ([place]) Yes    Colonoscopy to assess disease status The risks and benefits as well as alternatives of endoscopic procedure(s) have been discussed and reviewed. All questions answered. The patient agrees to proceed.   Subjective:   Chief Complaint: ulcerative colitis  HPI Is a 45 year old African-American woman known to me in the past, have not seen her in this clinic since 2013, currently getting her care for ulcerative [facility]. She has been having a flare in symptoms despite aggressive treatment with biologic therapy that includes Humira and her gastroenterologist at the [place] in [place] is requesting a colonoscopy. There is a backup of appointment availability and they did not feel like the procedure could be scheduled within a timely fashion so I care authorization outside the [place] was approved.  She's actually on Humira for vasculitis was manifested by skin changes but it helps her ulcerative colitis. Also on mesalamine Asacol 2.4 g. He did having diarrhea and urgency. Him crampy abdominal pain. No bleeding reported. Order to determine whether or not a change in therapy is needed a colonoscopy was advised and I do think that is reasonable.  No Known Allergies Outpatient Medications Prior to Visit  Medication Sig Dispense Refill  . insulin glargine (LANTUS) 100 UNIT/ML injection Inject 40 Units into the skin at bedtime.    Marland Kitchen levothyroxine (SYNTHROID) 137 MCG tablet Take 137 mcg by mouth daily.    Marland Kitchen omeprazole (PRILOSEC) 20 MG capsule Take 2 tablets by mouth once daily    . simvastatin (ZOCOR) 20 MG tablet Take 20 mg by mouth every evening.    Marland Kitchen HYDROCODONE-ACETAMINOPHEN PO Take by mouth.    . Cyclobenzaprine HCl (FLEXERIL PO) Take by mouth.    . mesalamine (ASACOL) 400 MG EC tablet Take 6 capsules by mouth every night      . ondansetron (ZOFRAN) 4 MG tablet Take 1 tab every 6 hours as needed for nausea. 15 tablet 0   No facility-administered medications prior to visit.    Past Medical History:  Diagnosis Date  . Allergic rhinitis   . Anemia   . Diabetes mellitus   . Endometriosis   . Fibromyalgia   . GERD (gastroesophageal reflux disease)   . Grave's disease   . Hypothyroid   . IDDM (insulin dependent diabetes mellitus) ([place])   . Left sided ulcerative colitis ([place])   . Migraine headache   . 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 Sleep apnea   . Vasculitis [facility])    Past Surgical History:  Procedure Laterality Date  . COLONOSCOPY  03/2008  . PARTIAL HYSTERECTOMY  07/2006   Social History   Social History  . Marital status: Married    Spouse name: N/A  . Number of children: 2  . Years of education: N/A   Occupational History  .  coder with [employer]   . was in the [place] Topics  . Smoking status: Never Smoker  . Smokeless tobacco: Never Used  . Alcohol use No  . Drug use: No  . Sexual activity: Not Asked   Other Topics Concern  . None   Social History Narrative  . None   Family History  Problem Relation Age of Onset  . Cervical cancer Paternal Grandmother   . Kidney disease Paternal Grandmother     ???  .  Diabetes Maternal Grandmother   . Heart disease Maternal Grandmother   . Heart disease Maternal Grandfather   . Colon cancer Neg Hx        Review of Systems Ground glass opacities in lungs have been noticed and there is a question of whether or not these are from her Humira. She gets followed in the pulmonary clinic at [facility] as well. Other review of systems appear negative at this time. Her diabetes is under better control and has been in the past.   Objective:   Physical Exam @BP  118/78   Pulse 82   Ht 5' 1"  (1.549 m)   Wt 212 lb (96.2 kg)   BMI 40.06 kg/m @  General:  Well-developed, well-nourished and in no acute distress Eyes:  anicteric. ENT:    Mouth and posterior pharynx free of lesions.  Neck:   supple w/o thyromegaly or mass.  Lungs: Clear to auscultation bilaterally. Heart:  S1S2, no rubs, murmurs, gallops. Abdomen:  soft, non-tender, no hepatosplenomegaly, hernia, or mass and BS+.  Rectal: deferred Lymph:  no cervical or supraclavicular adenopathy. Extremities:   no edema, cyanosis or clubbing Skin   no rash. Neuro:  A&O x 3.  Psych:  appropriate mood and  Affect.   Data Reviewed:   As per history of present illness. [facility].

## 2015-12-12 NOTE — Patient Instructions (Signed)
  You have been scheduled for a colonoscopy. Please follow written instructions given to you at your visit today.  Please pick up your prep supplies at the pharmacy. If you use inhalers (even only as needed), please bring them with you on the day of your procedure. Your physician has requested that you go to www.startemmi.com and enter the access code given to you at your visit today. This web site gives a general overview about your procedure. However, you should still follow specific instructions given to you by our office regarding your preparation for the procedure.     I appreciate the opportunity to care for you. Silvano Rusk, MD, [facility]

## 2015-12-16 ENCOUNTER — Telehealth: Payer: Self-pay

## 2015-12-16 NOTE — Telephone Encounter (Signed)
Faxed office notes from recent Dr Carlean Purl office visit 12/12/15 to [employer] for their records.  Fax # [phone].

## 2015-12-24 ENCOUNTER — Ambulatory Visit (AMBULATORY_SURGERY_CENTER): Payer: No Typology Code available for payment source | Admitting: Internal Medicine

## 2015-12-24 ENCOUNTER — Encounter: Payer: Self-pay | Admitting: Internal Medicine

## 2015-12-24 VITALS — BP 109/66 | HR 70 | Temp 97.8°F | Resp 11 | Ht 61.0 in | Wt 212.0 lb

## 2015-12-24 DIAGNOSIS — D123 Benign neoplasm of transverse colon: Secondary | ICD-10-CM

## 2015-12-24 DIAGNOSIS — D129 Benign neoplasm of anus and anal canal: Secondary | ICD-10-CM

## 2015-12-24 DIAGNOSIS — D125 Benign neoplasm of sigmoid colon: Secondary | ICD-10-CM

## 2015-12-24 DIAGNOSIS — D120 Benign neoplasm of cecum: Secondary | ICD-10-CM

## 2015-12-24 DIAGNOSIS — K51819 Other ulcerative colitis with unspecified complications: Secondary | ICD-10-CM

## 2015-12-24 DIAGNOSIS — K529 Noninfective gastroenteritis and colitis, unspecified: Secondary | ICD-10-CM | POA: Diagnosis not present

## 2015-12-24 DIAGNOSIS — K639 Disease of intestine, unspecified: Secondary | ICD-10-CM | POA: Diagnosis not present

## 2015-12-24 DIAGNOSIS — D128 Benign neoplasm of rectum: Secondary | ICD-10-CM

## 2015-12-24 DIAGNOSIS — D122 Benign neoplasm of ascending colon: Secondary | ICD-10-CM

## 2015-12-24 DIAGNOSIS — K635 Polyp of colon: Secondary | ICD-10-CM | POA: Diagnosis not present

## 2015-12-24 LAB — GLUCOSE, CAPILLARY
GLUCOSE-CAPILLARY: 159 mg/dL — AB (ref 65–99)
Glucose-Capillary: 197 mg/dL — ABNORMAL HIGH (ref 65–99)

## 2015-12-24 MED ORDER — SODIUM CHLORIDE 0.9 % IV SOLN: 500.0000 mL | INTRAVENOUS | Status: DC

## 2015-12-24 NOTE — Op Note (Signed)
[facility] Patient Name: Talissa Apple Procedure Date: 12/24/2015 8:32 AM MRN: [id number] Endoscopist: Gatha Mayer , MD Age: 45 Referring MD:  Date of Birth: 1970-12-05 Gender: Female Account #: [id number] Procedure:                Colonoscopy Indications:              Chronic ulcerative pancolitis, Disease activity                            assessment of chronic ulcerative pancolitis Medicines:                Monitored Anesthesia Care Procedure:                Pre-Anesthesia Assessment:                           - Prior to the procedure, a History and Physical                            was performed, and patient medications and                            allergies were reviewed. The patient's tolerance of                            previous anesthesia was also reviewed. The risks                            and benefits of the procedure and the sedation                            options and risks were discussed with the patient.                            All questions were answered, and informed consent                            was obtained. Prior Anticoagulants: The patient has                            taken no previous anticoagulant or antiplatelet                            agents. ASA Grade Assessment: III - A patient with                            severe systemic disease. After reviewing the risks                            and benefits, the patient was deemed in                            satisfactory condition to undergo the procedure.  After obtaining informed consent, the colonoscope                            was passed under direct vision. Throughout the                            procedure, the patient's blood pressure, pulse, and                            oxygen saturations were monitored continuously. The                            Model CF-HQ190L [id number]) scope was introduced                            through the  anus and advanced to the the terminal                            ileum, with identification of the appendiceal                            orifice and IC valve. The colonoscopy was performed                            without difficulty. The patient tolerated the                            procedure well. The quality of the bowel                            preparation was excellent. The bowel preparation                            used was Miralax. The terminal ileum, ileocecal                            valve, appendiceal orifice, and rectum were                            photographed. Scope In: 8:43:11 AM Scope Out: 8:58:19 AM Scope Withdrawal Time: 0 hours 12 minutes 3 seconds  Total Procedure Duration: 0 hours 15 minutes 8 seconds  Findings:                 The terminal ileum appeared normal.                           A diffuse area of mildly ulcerated mucosa was found                            in the ascending colon and in the cecum. Biopsies                            were taken with a cold forceps for histology.  Verification of patient identification for the                            specimen was done. Estimated blood loss was minimal.                           Patchy pseudopolyps were found in the sigmoid                            colon, in the descending colon and in the                            transverse colon.                           Normal mucosa was found in the rectum, in the                            sigmoid colon, in the descending colon and in the                            transverse colon. Two biopsies were taken every 10                            cm with a cold forceps from the transverse colon,                            descending colon, sigmoid colon and rectum. These                            biopsy specimens were sent to Pathology.                            Verification of patient identification for the                             specimen was done. Estimated blood loss was minimal.                           No additional abnormalities were found on                            retroflexion. Complications:            No immediate complications. Estimated Blood Loss:     Estimated blood loss was minimal. Impression:               - The examined portion of the ileum was normal.                           - Ulcerated mucosa in the ascending colon and in                            the cecum. Biopsied.                           -  Pseudopolyps in the sigmoid colon, in the                            descending colon and in the transverse colon.                            [Resected].                           - Normal mucosa in the rectum, in the sigmoid                            colon, in the descending colon and in the                            transverse colon. Biopsied.                           -                           MILDLY ACTIVE COLITIS IN CECUM AND ASCENDING COLON Recommendation:           - Patient has a contact number available for                            emergencies. The signs and symptoms of potential                            delayed complications were discussed with the                            patient. Return to normal activities tomorrow.                            Written discharge instructions were provided to the                            patient.                           - Resume previous diet.                           - Continue present medications.                           - Await pathology results.                           -                           SHE WILL RETURN TO [facility] FOR CARE - I WILL GET                            PATHOLOGY REPORT TO HER AS WELL                           -  Repeat colonoscopy for surveillance based on                            pathology results. Gatha Mayer, MD 12/24/2015 9:14:32 AM This report has been signed electronically.

## 2015-12-24 NOTE — Patient Instructions (Addendum)
    There was some active inflammation in the right colon. All else looked ok. I took biopsies and will let you know.  I appreciate the opportunity to care for you. Gatha Mayer, MD, FACG  YOU HAD AN ENDOSCOPIC PROCEDURE TODAY AT [facility]:   Refer to the procedure report that was given to you for any specific questions about what was found during the examination.  If the procedure report does not answer your questions, please call your gastroenterologist to clarify.  If you requested that your care partner not be given the details of your procedure findings, then the procedure report has been included in a sealed envelope for you to review at your convenience later.  YOU SHOULD EXPECT: Some feelings of bloating in the abdomen. Passage of more gas than usual.  Walking can help get rid of the air that was put into your GI tract during the procedure and reduce the bloating. If you had a lower endoscopy (such as a colonoscopy or flexible sigmoidoscopy) you may notice spotting of blood in your stool or on the toilet paper. If you underwent a bowel prep for your procedure, you may not have a normal bowel movement for a few days.  Please Note:  You might notice some irritation and congestion in your nose or some drainage.  This is from the oxygen used during your procedure.  There is no need for concern and it should clear up in a day or so.  SYMPTOMS TO REPORT IMMEDIATELY:   Following lower endoscopy (colonoscopy or flexible sigmoidoscopy):  Excessive amounts of blood in the stool  Significant tenderness or worsening of abdominal pains  Swelling of the abdomen that is new, acute  Fever of 100F or higher   For urgent or emergent issues, a gastroenterologist can be reached at any hour by calling [phone].   DIET:  We do recommend a small meal at first, but then you may proceed to your regular diet.  Drink plenty of fluids but you should avoid alcoholic beverages  for 24 hours.  ACTIVITY:  You should plan to take it easy for the rest of today and you should NOT DRIVE or use heavy machinery until tomorrow (because of the sedation medicines used during the test).    FOLLOW UP: Our staff will call the number listed on your records the next business day following your procedure to check on you and address any questions or concerns that you may have regarding the information given to you following your procedure. If we do not reach you, we will leave a message.  However, if you are feeling well and you are not experiencing any problems, there is no need to return our call.  We will assume that you have returned to your regular daily activities without incident.  If any biopsies were taken you will be contacted by phone or by letter within the next 1-3 weeks.  Please call us at [phone] if you have not heard about the biopsies in 3 weeks.    SIGNATURES/CONFIDENTIALITY: You and/or your care partner have signed paperwork which will be entered into your electronic medical record.  These signatures attest to the fact that that the information above on your After Visit Summary has been reviewed and is understood.  Full responsibility of the confidentiality of this discharge information lies with you and/or your care-partner.  Wait biospy results.  Repeat colonoscopy will be determined by pathology.

## 2015-12-24 NOTE — Progress Notes (Signed)
Called to room to assist during endoscopic procedure.  Patient ID and intended procedure confirmed with present staff. Received instructions for my participation in the procedure from the performing physician.  

## 2015-12-24 NOTE — Progress Notes (Signed)
A/ox3 pleased with MAC, report to April RN

## 2015-12-29 ENCOUNTER — Telehealth: Payer: Self-pay

## 2015-12-29 NOTE — Telephone Encounter (Signed)
  Follow up Call-  Call back number 12/24/2015  Post procedure Call Back phone  # [phone]  Permission to leave phone message Yes  Some recent data might be hidden     Patient questions:  Do you have a fever, pain , or abdominal swelling? No. Pain Score  0 *  Have you tolerated food without any problems? Yes.    Have you been able to return to your normal activities? Yes.    Do you have any questions about your discharge instructions: Diet   No. Medications  No. Follow up visit  No.  Do you have questions or concerns about your Care? No.  Actions: * If pain score is 4 or above: No action needed, pain <4.

## 2016-01-01 ENCOUNTER — Encounter: Payer: Self-pay | Admitting: Internal Medicine

## 2016-01-01 NOTE — Progress Notes (Signed)
Mildly active Right colitis Letter to patient she will return to [place] No recall

## 2016-11-01 DIAGNOSIS — I2510 Atherosclerotic heart disease of native coronary artery without angina pectoris: Secondary | ICD-10-CM

## 2016-11-01 HISTORY — DX: Atherosclerotic heart disease of native coronary artery without angina pectoris: I25.10

## 2016-11-22 ENCOUNTER — Inpatient Hospital Stay (HOSPITAL_COMMUNITY)
Admission: EM | Admit: 2016-11-22 | Discharge: 2016-12-02 | DRG: 234 | Disposition: A | Discharge status: Home / Self Care | Payer: PRIVATE HEALTH INSURANCE | Attending: Thoracic Surgery (Cardiothoracic Vascular Surgery) | Admitting: Thoracic Surgery (Cardiothoracic Vascular Surgery)

## 2016-11-22 ENCOUNTER — Encounter (HOSPITAL_COMMUNITY): Payer: Self-pay

## 2016-11-22 ENCOUNTER — Emergency Department (HOSPITAL_COMMUNITY): Payer: PRIVATE HEALTH INSURANCE

## 2016-11-22 DIAGNOSIS — K219 Gastro-esophageal reflux disease without esophagitis: Secondary | ICD-10-CM | POA: Diagnosis present

## 2016-11-22 DIAGNOSIS — E1169 Type 2 diabetes mellitus with other specified complication: Secondary | ICD-10-CM | POA: Diagnosis not present

## 2016-11-22 DIAGNOSIS — J9811 Atelectasis: Secondary | ICD-10-CM | POA: Diagnosis not present

## 2016-11-22 DIAGNOSIS — D899 Disorder involving the immune mechanism, unspecified: Secondary | ICD-10-CM | POA: Diagnosis present

## 2016-11-22 DIAGNOSIS — E669 Obesity, unspecified: Secondary | ICD-10-CM | POA: Diagnosis present

## 2016-11-22 DIAGNOSIS — Z91013 Allergy to seafood: Secondary | ICD-10-CM

## 2016-11-22 DIAGNOSIS — I252 Old myocardial infarction: Secondary | ICD-10-CM

## 2016-11-22 DIAGNOSIS — IMO0001 Reserved for inherently not codable concepts without codable children: Secondary | ICD-10-CM | POA: Diagnosis present

## 2016-11-22 DIAGNOSIS — Z794 Long term (current) use of insulin: Secondary | ICD-10-CM

## 2016-11-22 DIAGNOSIS — E1165 Type 2 diabetes mellitus with hyperglycemia: Secondary | ICD-10-CM

## 2016-11-22 DIAGNOSIS — E119 Type 2 diabetes mellitus without complications: Secondary | ICD-10-CM

## 2016-11-22 DIAGNOSIS — G43909 Migraine, unspecified, not intractable, without status migrainosus: Secondary | ICD-10-CM | POA: Diagnosis present

## 2016-11-22 DIAGNOSIS — E785 Hyperlipidemia, unspecified: Secondary | ICD-10-CM | POA: Diagnosis present

## 2016-11-22 DIAGNOSIS — Z6841 Body Mass Index (BMI) 40.0 and over, adult: Secondary | ICD-10-CM

## 2016-11-22 DIAGNOSIS — E039 Hypothyroidism, unspecified: Secondary | ICD-10-CM | POA: Diagnosis present

## 2016-11-22 DIAGNOSIS — I25110 Atherosclerotic heart disease of native coronary artery with unstable angina pectoris: Secondary | ICD-10-CM | POA: Diagnosis present

## 2016-11-22 DIAGNOSIS — Z79899 Other long term (current) drug therapy: Secondary | ICD-10-CM

## 2016-11-22 DIAGNOSIS — R400 Somnolence: Secondary | ICD-10-CM | POA: Diagnosis not present

## 2016-11-22 DIAGNOSIS — G709 Myoneural disorder, unspecified: Secondary | ICD-10-CM | POA: Diagnosis present

## 2016-11-22 DIAGNOSIS — M797 Fibromyalgia: Secondary | ICD-10-CM | POA: Diagnosis present

## 2016-11-22 DIAGNOSIS — Z951 Presence of aortocoronary bypass graft: Secondary | ICD-10-CM

## 2016-11-22 DIAGNOSIS — I214 Non-ST elevation (NSTEMI) myocardial infarction: Principal | ICD-10-CM | POA: Diagnosis present

## 2016-11-22 DIAGNOSIS — Z8639 Personal history of other endocrine, nutritional and metabolic disease: Secondary | ICD-10-CM | POA: Diagnosis not present

## 2016-11-22 DIAGNOSIS — E876 Hypokalemia: Secondary | ICD-10-CM | POA: Diagnosis not present

## 2016-11-22 DIAGNOSIS — Z09 Encounter for follow-up examination after completed treatment for conditions other than malignant neoplasm: Secondary | ICD-10-CM

## 2016-11-22 DIAGNOSIS — G4730 Sleep apnea, unspecified: Secondary | ICD-10-CM | POA: Diagnosis present

## 2016-11-22 DIAGNOSIS — I10 Essential (primary) hypertension: Secondary | ICD-10-CM

## 2016-11-22 DIAGNOSIS — Z792 Long term (current) use of antibiotics: Secondary | ICD-10-CM

## 2016-11-22 DIAGNOSIS — I2510 Atherosclerotic heart disease of native coronary artery without angina pectoris: Secondary | ICD-10-CM | POA: Diagnosis not present

## 2016-11-22 DIAGNOSIS — K5090 Crohn's disease, unspecified, without complications: Secondary | ICD-10-CM | POA: Diagnosis present

## 2016-11-22 DIAGNOSIS — Z923 Personal history of irradiation: Secondary | ICD-10-CM | POA: Diagnosis not present

## 2016-11-22 DIAGNOSIS — Z9689 Presence of other specified functional implants: Secondary | ICD-10-CM

## 2016-11-22 DIAGNOSIS — Z8679 Personal history of other diseases of the circulatory system: Secondary | ICD-10-CM

## 2016-11-22 DIAGNOSIS — E118 Type 2 diabetes mellitus with unspecified complications: Secondary | ICD-10-CM | POA: Diagnosis not present

## 2016-11-22 DIAGNOSIS — D62 Acute posthemorrhagic anemia: Secondary | ICD-10-CM | POA: Diagnosis not present

## 2016-11-22 DIAGNOSIS — I208 Other forms of angina pectoris: Secondary | ICD-10-CM

## 2016-11-22 DIAGNOSIS — J90 Pleural effusion, not elsewhere classified: Secondary | ICD-10-CM | POA: Diagnosis not present

## 2016-11-22 DIAGNOSIS — Z01810 Encounter for preprocedural cardiovascular examination: Secondary | ICD-10-CM | POA: Diagnosis not present

## 2016-11-22 DIAGNOSIS — M1990 Unspecified osteoarthritis, unspecified site: Secondary | ICD-10-CM | POA: Diagnosis present

## 2016-11-22 HISTORY — DX: Presence of aortocoronary bypass graft: Z95.1

## 2016-11-22 HISTORY — DX: Non-ST elevation (NSTEMI) myocardial infarction: I21.4

## 2016-11-22 HISTORY — DX: Type 2 diabetes mellitus without complications: E11.9

## 2016-11-22 HISTORY — DX: Unspecified osteoarthritis, unspecified site: M19.90

## 2016-11-22 HISTORY — DX: Disorder of lipoprotein metabolism, unspecified: E78.9

## 2016-11-22 LAB — COMPREHENSIVE METABOLIC PANEL
ALBUMIN: 3.8 g/dL (ref 3.5–5.0)
ALT: 21 U/L (ref 14–54)
AST: 28 U/L (ref 15–41)
Alkaline Phosphatase: 110 U/L (ref 38–126)
Anion gap: 10 (ref 5–15)
BUN: 10 mg/dL (ref 6–20)
CHLORIDE: 107 mmol/L (ref 101–111)
CO2: 17 mmol/L — ABNORMAL LOW (ref 22–32)
CREATININE: 0.75 mg/dL (ref 0.44–1.00)
Calcium: 8.7 mg/dL — ABNORMAL LOW (ref 8.9–10.3)
GFR calc Af Amer: >60 mL/min (ref >60)
GLUCOSE: 245 mg/dL — AB (ref 65–99)
Potassium: 4.1 mmol/L (ref 3.5–5.1)
Sodium: 134 mmol/L — ABNORMAL LOW (ref 135–145)
Total Bilirubin: 0.5 mg/dL (ref 0.3–1.2)
Total Protein: 7.1 g/dL (ref 6.5–8.1)

## 2016-11-22 LAB — I-STAT TROPONIN, ED
TROPONIN I, POC: 0.02 ng/mL (ref 0.00–0.08)
TROPONIN I, POC: 0.55 ng/mL — AB (ref 0.00–0.08)

## 2016-11-22 LAB — CBC
HCT: 37.3 % (ref 36.0–46.0)
Hemoglobin: 12 g/dL (ref 12.0–15.0)
MCH: 29.2 pg (ref 26.0–34.0)
MCHC: 32.2 g/dL (ref 30.0–36.0)
MCV: 90.8 fL (ref 78.0–100.0)
Platelets: 288 10*3/uL (ref 150–400)
RBC: 4.11 MIL/uL (ref 3.87–5.11)
RDW: 14.5 % (ref 11.5–15.5)
WBC: 11.3 10*3/uL — ABNORMAL HIGH (ref 4.0–10.5)

## 2016-11-22 LAB — LIPASE, BLOOD: LIPASE: 24 U/L (ref 11–51)

## 2016-11-22 LAB — TROPONIN I: Troponin I: 4.84 ng/mL (ref <0.03)

## 2016-11-22 MED ORDER — SODIUM CHLORIDE 0.9 % WEIGHT BASED INFUSION
1.0000 mL/kg/h | INTRAVENOUS | Status: DC
  Administered 2016-11-23: 1 mL/kg/h via INTRAVENOUS

## 2016-11-22 MED ORDER — NITROGLYCERIN IN D5W 200-5 MCG/ML-% IV SOLN
0.0000 ug/min | Freq: Once | INTRAVENOUS | Status: AC
  Administered 2016-11-22: 5 ug/min via INTRAVENOUS
  Filled 2016-11-22: qty 250

## 2016-11-22 MED ORDER — ACETAMINOPHEN 325 MG PO TABS
650.0000 mg | ORAL_TABLET | ORAL | Status: DC | PRN
  Administered 2016-11-22 – 2016-11-25 (×3): 650 mg via ORAL
  Filled 2016-11-22 (×3): qty 2

## 2016-11-22 MED ORDER — GI COCKTAIL ~~LOC~~
30.0000 mL | Freq: Once | ORAL | Status: AC
  Administered 2016-11-22: 30 mL via ORAL
  Filled 2016-11-22: qty 30

## 2016-11-22 MED ORDER — SODIUM CHLORIDE 0.9 % IV BOLUS (SEPSIS)
1000.0000 mL | Freq: Once | INTRAVENOUS | Status: AC
  Administered 2016-11-22: 1000 mL via INTRAVENOUS

## 2016-11-22 MED ORDER — INSULIN GLARGINE 100 UNIT/ML ~~LOC~~ SOLN
45.0000 [IU] | Freq: Every day | SUBCUTANEOUS | Status: DC
  Administered 2016-11-23 – 2016-11-25 (×4): 45 [IU] via SUBCUTANEOUS
  Filled 2016-11-22 (×4): qty 0.45

## 2016-11-22 MED ORDER — SODIUM CHLORIDE 0.9 % IV SOLN: 250.0000 mL | INTRAVENOUS | Status: DC | PRN

## 2016-11-22 MED ORDER — ATORVASTATIN CALCIUM 20 MG PO TABS
20.0000 mg | ORAL_TABLET | Freq: Every day | ORAL | Status: DC
  Administered 2016-11-23: 20 mg via ORAL
  Filled 2016-11-22: qty 1

## 2016-11-22 MED ORDER — HEPARIN BOLUS VIA INFUSION
4000.0000 [IU] | Freq: Once | INTRAVENOUS | Status: AC
  Administered 2016-11-22: 4000 [IU] via INTRAVENOUS
  Filled 2016-11-22: qty 4000

## 2016-11-22 MED ORDER — HEPARIN (PORCINE) IN NACL 100-0.45 UNIT/ML-% IJ SOLN
1050.0000 [IU]/h | INTRAMUSCULAR | Status: DC
  Administered 2016-11-22: 850 [IU]/h via INTRAVENOUS
  Filled 2016-11-22 (×3): qty 250

## 2016-11-22 MED ORDER — ASPIRIN EC 81 MG PO TBEC
81.0000 mg | DELAYED_RELEASE_TABLET | Freq: Every day | ORAL | Status: DC
  Administered 2016-11-24 – 2016-11-25 (×2): 81 mg via ORAL
  Filled 2016-11-22 (×3): qty 1

## 2016-11-22 MED ORDER — ASPIRIN 81 MG PO CHEW
324.0000 mg | CHEWABLE_TABLET | Freq: Once | ORAL | Status: AC
  Administered 2016-11-22: 324 mg via ORAL
  Filled 2016-11-22: qty 4

## 2016-11-22 MED ORDER — SODIUM CHLORIDE 0.9% FLUSH: 3.0000 mL | INTRAVENOUS | Status: DC | PRN

## 2016-11-22 MED ORDER — KETOROLAC TROMETHAMINE 30 MG/ML IJ SOLN
30.0000 mg | Freq: Once | INTRAMUSCULAR | Status: AC
  Administered 2016-11-22: 30 mg via INTRAVENOUS
  Filled 2016-11-22: qty 1

## 2016-11-22 MED ORDER — SODIUM CHLORIDE 0.9 % WEIGHT BASED INFUSION
3.0000 mL/kg/h | INTRAVENOUS | Status: DC
  Administered 2016-11-23: 3 mL/kg/h via INTRAVENOUS

## 2016-11-22 MED ORDER — ONDANSETRON HCL 4 MG/2ML IJ SOLN: 4.0000 mg | Freq: Four times a day (QID) | INTRAMUSCULAR | Status: DC | PRN

## 2016-11-22 MED ORDER — SODIUM CHLORIDE 0.9% FLUSH: 3.0000 mL | Freq: Two times a day (BID) | INTRAVENOUS | Status: DC

## 2016-11-22 NOTE — Progress Notes (Signed)
ANTICOAGULATION CONSULT NOTE - Initial Consult  Pharmacy Consult for heparin Indication: chest pain/ACS  Allergies  Allergen Reactions  . Shellfish Allergy Hives and Swelling    And hives    Patient Measurements: Height: 5' 1"  (154.9 cm) Weight: 212 lb 8.4 oz (96.4 kg) IBW/kg (Calculated) : 47.8 Heparin Dosing Weight: 71 kg  Vital Signs: Temp: 97.5 F (36.4 C) (10/22 0911) Temp Source: Oral (10/22 0911) BP: 117/70 (10/22 1702) Pulse Rate: 70 (10/22 1702)  Labs:  Recent Labs  11/22/16 1127  HGB 12.0  HCT 37.3  PLT 288  CREATININE 0.75    Estimated Creatinine Clearance: 93.2 mL/min (by C-G formula based on SCr of 0.75 mg/dL).  Assessment: CC/HPI: 46 yo f presenting with chest wall, arm, back pain, N/V  PMH: fibromyalgia, hypothyroid, IDDM  Anticoag: none pta - iv hep for r/o ACS.   Renal: SCr 0.75   Heme/Onc: H&H 12/37.3, Plt 288  Goal of Therapy:  Heparin level 0.3-0.7 units/ml Monitor platelets by anticoagulation protocol: Yes   Plan:  Heparin bolus 4000 units x 1 Heparin infusion 850 units/hr Initial lvl 2300 Daily HL, CBC F/u Cards plans  Levester Fresh, PharmD, BCPS, BCCCP Clinical Pharmacist Clinical phone for 11/22/2016 from 7a-3:30p: [phone] If after 3:30p, please call main pharmacy at: [phone] 11/22/2016 5:06 PM

## 2016-11-22 NOTE — ED Notes (Signed)
Critical lab 4.84 Troponin, will notify admitting MD

## 2016-11-22 NOTE — H&P (Signed)
History & Physical    Patient ID: [name] [name] MRN: [id number], DOB/AGE: [birth year]   Admit date: 11/22/2016   Primary Physician: System, Pcp Not In Primary Cardiologist: Ellyn Hack    Patient Profile    46 yo female with PMH of IDDM, GERD, Grave's disease, Hypothyroidism, Vasculitis, and endometriosis who presented with chest pain, n/v and arm pain.   Past Medical History   Past Medical History:  Diagnosis Date  . Allergic rhinitis   . Anemia   . Diabetes mellitus   . Endometriosis   . Fibromyalgia   . GERD (gastroesophageal reflux disease)   . Grave's disease   . Hypothyroid   . IDDM (insulin dependent diabetes mellitus) ([place])   . Left sided ulcerative colitis ([place])   . Migraine headache   . 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 Sleep apnea   . Vasculitis [facility])     Past Surgical History:  Procedure Laterality Date  . COLONOSCOPY  03/2008  . PARTIAL HYSTERECTOMY  07/2006     Allergies  Allergies  Allergen Reactions  . Shellfish Allergy Hives and Swelling    And hives    History of Present Illness    [name] [name] is a 46 yo female with PMH of IDDM, GERD, Grave's disease, Hypothyroidism, Vasculitis, and endometriosis. Reports she has been followed at [facility] for UC on a biologic. Denies any family Hx of CAD. Has not seen a cardiologist in the past. Has been an IDDM for at least 10 years, and has struggled with Hgb A1c control. Thinks last reading was around 9. Normally has GERD symptoms that are easily treated with PPI. Presented to UC about a week ago with symptoms that she thought was GERD, given GI cocktail with some improvement and sent home. This morning symptoms returned with left sided chest pressure with radiation into the left arm, upper back with n/v and shortness of breath.   Presented to the ED. Labs showed stable electrolytes, Trop 0.02, Hgb 12.0. EKG initially showed SR without acute ST/T wave changes. Was given GI cocktail and Toradol with mild improvement, but pain  returned. Second Trop 0.5, and repeat EKG showed TWI in inferior leads. Still with mild chest burning/pressure - but significant improvement from 8/10 upon arrival.  Home Medications    Prior to Admission medications   Medication Sig Start Date End Date Taking? Authorizing Provider  atorvastatin (LIPITOR) 20 MG tablet Take 20 tablets by mouth daily.   Yes [provider]  folic acid (FOLVITE) 1 MG tablet Take 1 mg by mouth daily.   Yes [provider]  ibuprofen (ADVIL,MOTRIN) 200 MG tablet Take 400 mg by mouth every 6 (six) hours as needed for moderate pain.   Yes [provider]  insulin glargine (LANTUS) 100 UNIT/ML injection Inject 45 Units into the skin at bedtime.    Yes [provider]  levothyroxine (SYNTHROID) 100 MCG tablet Take 0.1 mcg by mouth daily.    Yes [provider]  metFORMIN (GLUMETZA) 500 MG (MOD) 24 hr tablet Take 1,000 mg by mouth daily after supper.   Yes [provider]  methotrexate (RHEUMATREX) 2.5 MG tablet Take 15 mg by mouth once a week. Caution:Chemotherapy. Protect from light.   Yes [provider]  omeprazole (PRILOSEC) 20 MG capsule Take 2 tablets by mouth once daily   Yes [provider]  sulfamethoxazole-trimethoprim (BACTRIM DS,SEPTRA DS) 800-160 MG tablet Take 1 tablet by mouth 3 (three) times a week.   Yes [provider]  Family History     Family History  Problem Relation Age of Onset  . Cervical cancer Paternal Grandmother   . Kidney disease Paternal Grandmother        ???  . Diabetes Maternal Grandmother   . Heart disease Maternal Grandmother   . Heart disease Maternal Grandfather   . Colon cancer Neg Hx     Social History    Social History   Social History  . Marital status: Married    Spouse name: N/A  . Number of children: 2  . Years of education: N/A   Occupational History  .  coder with [employer]   . was in the [place]  Topics  . Smoking status: Never Smoker  . Smokeless tobacco: Never Used  . Alcohol use No  . Drug use: No  . Sexual activity: Not on file   Other Topics Concern  . Not on file   Social History Narrative  . No narrative on file     Review of Systems    All other systems reviewed and are otherwise negative except as noted above.  Physical Exam    Blood pressure 108/68, pulse 78, temperature (!) 97.5 F (36.4 C), temperature source Oral, resp. rate 15, height 5' 1"  (1.549 m), weight 212 lb 8.4 oz (96.4 kg), SpO2 100 %.  General: Pleasant, NAD Psych: Normal affect. Neuro: Alert and oriented X 3. Moves all extremities spontaneously. CN II-XII grossly intact, strength 5/5 t/o HEENT: Normal  Neck: Supple without bruits or JVD. Lungs:  Resp regular and unlabored, CTAB Heart: RRR, normal S1 & S2.  No s3, s4, or M/R. Abdomen: Soft, non-tender, non-distended, BS + x 4.  Extremities: No clubbing, cyanosis or edema. DP/PT/Radials 2+ and equal bilaterally.  Labs    Troponin [facility] Test)  Recent Labs  11/22/16 1346  TROPIPOC 0.55*   No results for input(s): CKTOTAL, CKMB, TROPONINI in the last 72 hours. Lab Results  Component Value Date   WBC 11.3 (H) 11/22/2016   HGB 12.0 11/22/2016   HCT 37.3 11/22/2016   MCV 90.8 11/22/2016   PLT 288 11/22/2016     Recent Labs Lab 11/22/16 1127  NA 134*  K 4.1  CL 107  CO2 17*  BUN 10  CREATININE 0.75  CALCIUM 8.7*  PROT 7.1  BILITOT 0.5  ALKPHOS 110  ALT 21  AST 28  GLUCOSE 245*   No results found for: CHOL, HDL, LDLCALC, TRIG No results found for: [facility]   Radiology Studies    Dg Chest 2 View  Result Date: 11/22/2016 CLINICAL DATA:  Midline chest pain EXAM: CHEST  2 VIEW COMPARISON:  08/07/2006 FINDINGS: Cardiomegaly with vascular congestion. Improving perihilar opacities. Mild residual bibasilar opacities, likely atelectasis. No effusions. IMPRESSION: Improving perihilar opacities, likely resolving edema.  Bibasilar atelectasis. Electronically Signed   By: Rolm Baptise M.D.   On: 11/22/2016 10:19    ECG & Cardiac Imaging    EKG: Second EKG with SR and new TWI in inferior leads  Assessment & Plan    46 yo female with PMH of IDDM, GERD, Grave's disease, Hypothyroidism, Vasculitis, and endometriosis who presented with chest pain, n/v and arm pain.   1. ACS/NSTEMI: Presented with chest burning/pressure that started this morning. Trop 0.02>>0.5 with second EKG showing new TWI in inferior leads. Has been an IDDM for over 10 years with poorly controlled Hgb A1c per patient report. Given symptoms, CRFs and positive trop --  recommend definitve cath. -- admit to tele -- cycle troponins -- IV heparin -- nitro gtt, hold on BB given addition of nitro gtt -- The patient understands that risks included but are not limited to stroke (1 in 1000), death (1 in 1000), kidney failure [usually temporary] (1 in 500), bleeding (1 in 200), allergic reaction [possibly serious] (1 in 200).  -- NPO after midnight -- call to post for Cath tomorrow. -- will need to be cognizant of her underlying Ulcerative Colitis & be judicious with antiplatelet regimen  2. IDDM: hold metformin in anticipation for cath  -- SSI  3. HL: on statin therapy  4. Hypothyroidism: continue synthroid   Barnet Pall, NP-C Pager [phone] 11/22/2016, 7:04 PM    I have seen, examined and evaluated the patient this PM along with Reino Bellis, NP-C.  After reviewing all the available data and chart, we discussed the patients laboratory, study & physical findings as well as symptoms in detail. I agree with her  findings, examination as well as impression recommendations as per our discussion.    Attending adjustments noted in italics.    Very pleasant young woman who may have had some warning signs of a typical angina a few days ago who now presents with more classic anginal type symptom of left-sided chest discomfort  radiating to her neck and left arm earlier this morning.  Upon arrival to the emergency room her pain was relatively significant. This was relieved with nitroglycerin as well as Toradol. Initial troponin levels were negative and the initial EKG was relatively benign, however follow-up EKG and troponin levels were both concerning. EKG shows new T-wave inversions inferiorly leads, and troponin levels now 0.55 consistent with likely ACS/non-STEMI.  She does have diabetes mellitus on insulin for quite some time on her own regimen. We'll continue her home regimen. We discussed the options of noninvasive versus invasive evaluation for her MI, she agrees that she would prefer to go forward with invasive evaluation with cardiac catheterization.  Exam is relatively benign as noted above. No signs or symptoms of heart failure.  Unfortunately heparin has not yet been started emergency room Q, we have started now. We will also use IV nitroglycerin as opposed to nitroglycerin paste for better control. Agree with holding beta blocker until we see her blood pressure response. Statin.  Plan doing for his cath tomorrow.    Glenetta Hew, M.D., M.S. Interventional Cardiologist   Pager # [phone] Phone # [phone] [address] [place], [place] [address]

## 2016-11-22 NOTE — ED Notes (Signed)
Patient transported to X-ray 

## 2016-11-22 NOTE — ED Notes (Signed)
Pt stated that she was having chest pain. Informed Dr. Jeneen Rinks and Aaron Edelman - RN.

## 2016-11-22 NOTE — ED Triage Notes (Signed)
Pt states she has chest pain that started around 0600. She states she ate shellfish last night as well. Reports nausea and vomiting this morning. States chest pain is intermittent. Skin warm and dry.

## 2016-11-22 NOTE — ED Provider Notes (Signed)
[facility] EMERGENCY DEPARTMENT Provider Note   CSN: [id number] Arrival date & time: 11/22/16  0907     History   Chief Complaint Chief Complaint  Patient presents with  . Chest Pain    HPI [name] [name] is a 46 y.o. female.  HPI   Patient is a 46 year old female presenting with multiple symptoms. She reports that she has discomfort in the left chest wall and left arm and the back. She reports nausea and 2 episodes of vomiting. She reports is all started this morning. She says it feels similar to last time she had a reaction to an allergen (crab). She has history of fibromyalgia, hypothyroid, insulin-dependent diabetes, migraine headaches, vasculitis, and ulcerative colitis on biologic followed at [facility].  Also csymptoms started this monring. Not described exertionally related. No diarrhea. No blood in vomit. No fever.. Patient ate shrimp last night.  Past Medical History:  Diagnosis Date  . Allergic rhinitis   . Anemia   . Diabetes mellitus   . Endometriosis   . Fibromyalgia   . GERD (gastroesophageal reflux disease)   . Grave's disease   . Hypothyroid   . IDDM (insulin dependent diabetes mellitus) ([place])   . Left sided ulcerative colitis ([place])   . Migraine headache   . 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 Sleep apnea   . Vasculitis [facility])     Patient Active Problem List   Diagnosis Date Noted  . HYPOTHYROIDISM 11/20/2008  . DIABETES MELLITUS, TYPE II, UNCONTROLLED 11/20/2008  . HYPERLIPIDEMIA 11/20/2008  . ANEMIA-UNSPECIFIED 11/20/2008  . DIARRHEA 11/20/2008  . ULCERATIVE COLITIS-LEFT SIDE 04/11/2008    Past Surgical History:  Procedure Laterality Date  . COLONOSCOPY  03/2008  . PARTIAL HYSTERECTOMY  07/2006    OB History    No data available       Home Medications    Prior to Admission medications   Medication Sig Start Date End Date Taking? Authorizing Provider  Adalimumab (HUMIRA) 40 MG/0.8ML PSKT Inject 40 mg into the skin. Every other week, rx  thru the [place]    [provider]  insulin glargine (LANTUS) 100 UNIT/ML injection Inject 40 Units into the skin at bedtime.    [provider]  levothyroxine (SYNTHROID) 137 MCG tablet Take 137 mcg by mouth daily.    [provider]  omeprazole (PRILOSEC) 20 MG capsule Take 2 tablets by mouth once daily    [provider]  simvastatin (ZOCOR) 20 MG tablet Take 20 mg by mouth every evening.    [provider]    Family History Family History  Problem Relation Age of Onset  . Cervical cancer Paternal Grandmother   . Kidney disease Paternal Grandmother        ???  . Diabetes Maternal Grandmother   . Heart disease Maternal Grandmother   . Heart disease Maternal Grandfather   . Colon cancer Neg Hx     Social History Social History  Substance Use Topics  . Smoking status: Never Smoker  . Smokeless tobacco: Never Used  . Alcohol use No     Allergies   Shellfish allergy   Review of Systems Review of Systems  Constitutional: Negative for activity change.  Respiratory: Negative for shortness of breath.   Cardiovascular: Positive for chest pain. Negative for palpitations.  Gastrointestinal: Positive for vomiting. Negative for abdominal pain, constipation and diarrhea.     Physical Exam Updated Vital Signs BP (!) 142/104   Pulse 82   Temp (!) 97.5 F (  36.4 C) (Oral)   Resp 15   SpO2 100%   Physical Exam  Constitutional: She is oriented to person, place, and time. She appears well-developed and well-nourished.  HENT:  Head: Normocephalic and atraumatic.  Eyes: Right eye exhibits no discharge. Left eye exhibits no discharge.  Cardiovascular: Normal rate, regular rhythm and normal heart sounds.   No murmur heard. Pulmonary/Chest: Effort normal and breath sounds normal. She has no wheezes. She has no rales.  No tenderness to chest wall  Abdominal: Soft. She exhibits no distension. There is no tenderness.  Neurological: She is  oriented to person, place, and time.  Skin: Skin is warm and dry. She is not diaphoretic.  Psychiatric: She has a normal mood and affect.  Nursing note and vitals reviewed.    ED Treatments / Results  Labs (all labs ordered are listed, but only abnormal results are displayed) Labs Reviewed  BASIC METABOLIC PANEL  CBC  LIPASE, BLOOD  COMPREHENSIVE METABOLIC PANEL  I-STAT TROPONIN, ED    EKG  EKG Interpretation  Date/Time:  Monday November 22 2016 09:12:56 EDT Ventricular Rate:  79 PR Interval:  140 QRS Duration: 82 QT Interval:  396 QTC Calculation: 454 R Axis:   64 Text Interpretation:  Normal sinus rhythm Normal ECG Normal sinus rhythm No significant change since last tracing Confirmed by [place], [place] [phone]) on 11/22/2016 10:00:59 AM       EKG Interpretation  Date/Time:  Monday November 22 2016 14:11:12 EDT Ventricular Rate:  79 PR Interval:  140 QRS Duration: 90 QT Interval:  436 QTC Calculation: 500 R Axis:   52 Text Interpretation:  Sinus rhythm Inferior infarct, age indeterminate Flipped t since earlier ttoday  Confirmed by [place], [place] Course [phone]) on 11/22/2016 2:20:40 PM       Radiology No results found.  Procedures Procedures (including critical care time)   CRITICAL CARE Performed by: Gardiner Sleeper Total critical care time: 60 minutes Critical care time was exclusive of separately billable procedures and treating other patients. Critical care was necessary to treat or prevent imminent or life-threatening deterioration. Critical care was time spent personally by me on the following activities: development of treatment plan with patient and/or surrogate as well as nursing, discussions with consultants, evaluation of patient's response to treatment, examination of patient, obtaining history from patient or surrogate, ordering and performing treatments and interventions, ordering and review of laboratory studies, ordering and review of  radiographic studies, pulse oximetry and re-evaluation of patient's condition.  Medications Ordered in ED Medications  gi cocktail (Maalox,Lidocaine,Donnatal) (not administered)  ketorolac (TORADOL) 30 MG/ML injection 30 mg (not administered)  sodium chloride 0.9 % bolus 1,000 mL (not administered)     Initial Impression / Assessment and Plan / ED Course  I have reviewed the triage vital signs and the nursing notes.  Pertinent labs & imaging results that were available during my care of the patient were reviewed by me and considered in my medical decision making (see chart for details).     Patient is a 46 year old female presenting with multiple symptoms. She reports that she has discomfort in the left chest wall and left arm and the back. She reports nausea and 2 episodes of vomiting. She reports is all started this morning. She says it feels similar to last time she had a reaction to an allergen (crab). She has history of fibromyalgia, hypothyroid, insulin-dependent diabetes, migraine headaches, vasculitis, and ulcerative colitis on biologic followed at [facility].  Also csymptoms started this monring. Not  exertionally related. No diarrhea. No blood in vomit. No fever.. Patient ate shrimp last night.   2:22 PM Patient did have some pain on walking. Tech shot EKG. Troponin I come back positive. Recheck EKG. There are flipped T's in lead 3. That is different from her 9 AM EKG to her 12 PM EKG.  Discussed with cards. They will see. ASA given. CXR shows no widened mediastinum.     Final Clinical Impressions(s) / ED Diagnoses   Final diagnoses:  None    New Prescriptions New Prescriptions   No medications on file     Macarthur Critchley, MD 11/22/16 [phone]

## 2016-11-22 NOTE — ED Notes (Signed)
ED Provider at bedside. 

## 2016-11-22 NOTE — ED Notes (Signed)
Unable to collect basic labs due to IV site.

## 2016-11-22 NOTE — ED Notes (Signed)
Pt ambulated to bathroom w/ out difficulty.

## 2016-11-22 NOTE — ED Notes (Signed)
Cardiologist at Dr. Ellyn Hack at bedside.

## 2016-11-22 NOTE — ED Notes (Signed)
Pt in NAD. Reports her pain is better than it was,.

## 2016-11-22 NOTE — ED Notes (Signed)
Cardiology at bedside.

## 2016-11-23 ENCOUNTER — Encounter (HOSPITAL_COMMUNITY)
Admission: EM | Disposition: A | Discharge status: Home / Self Care | Payer: Self-pay | Source: Home / Self Care | Attending: Thoracic Surgery (Cardiothoracic Vascular Surgery)

## 2016-11-23 ENCOUNTER — Encounter (HOSPITAL_COMMUNITY): Payer: Self-pay | Admitting: Cardiovascular Disease

## 2016-11-23 DIAGNOSIS — I25110 Atherosclerotic heart disease of native coronary artery with unstable angina pectoris: Secondary | ICD-10-CM

## 2016-11-23 DIAGNOSIS — I2510 Atherosclerotic heart disease of native coronary artery without angina pectoris: Secondary | ICD-10-CM

## 2016-11-23 DIAGNOSIS — E119 Type 2 diabetes mellitus without complications: Secondary | ICD-10-CM

## 2016-11-23 DIAGNOSIS — I214 Non-ST elevation (NSTEMI) myocardial infarction: Secondary | ICD-10-CM

## 2016-11-23 HISTORY — PX: LEFT HEART CATH AND CORONARY ANGIOGRAPHY: CATH118249

## 2016-11-23 LAB — LIPID PANEL
CHOLESTEROL: 132 mg/dL (ref 0–200)
HDL: 38 mg/dL — ABNORMAL LOW (ref >40)
LDL CALC: 73 mg/dL (ref 0–99)
Total CHOL/HDL Ratio: 3.5 RATIO
Triglycerides: 103 mg/dL (ref <150)
VLDL: 21 mg/dL (ref 0–40)

## 2016-11-23 LAB — GLUCOSE, CAPILLARY
GLUCOSE-CAPILLARY: 146 mg/dL — AB (ref 65–99)
GLUCOSE-CAPILLARY: 73 mg/dL (ref 65–99)
Glucose-Capillary: 290 mg/dL — ABNORMAL HIGH (ref 65–99)
Glucose-Capillary: 153 mg/dL — ABNORMAL HIGH (ref 65–99)

## 2016-11-23 LAB — CBC
HCT: 32 % — ABNORMAL LOW (ref 36.0–46.0)
HEMOGLOBIN: 10.3 g/dL — AB (ref 12.0–15.0)
MCH: 29.1 pg (ref 26.0–34.0)
MCHC: 32.2 g/dL (ref 30.0–36.0)
MCV: 90.4 fL (ref 78.0–100.0)
Platelets: 300 10*3/uL (ref 150–400)
RBC: 3.54 MIL/uL — AB (ref 3.87–5.11)
RDW: 14.3 % (ref 11.5–15.5)
WBC: 10.6 10*3/uL — ABNORMAL HIGH (ref 4.0–10.5)

## 2016-11-23 LAB — HEPARIN LEVEL (UNFRACTIONATED): Heparin Unfractionated: 0.17 IU/mL — ABNORMAL LOW (ref 0.30–0.70)

## 2016-11-23 LAB — BASIC METABOLIC PANEL
Anion gap: 8 (ref 5–15)
BUN: 9 mg/dL (ref 6–20)
CO2: 20 mmol/L — ABNORMAL LOW (ref 22–32)
CREATININE: 0.82 mg/dL (ref 0.44–1.00)
Calcium: 7.9 mg/dL — ABNORMAL LOW (ref 8.9–10.3)
Chloride: 106 mmol/L (ref 101–111)
GFR calc Af Amer: >60 mL/min (ref >60)
GFR calc non Af Amer: >60 mL/min (ref >60)
GLUCOSE: 190 mg/dL — AB (ref 65–99)
POTASSIUM: 3.4 mmol/L — AB (ref 3.5–5.1)
SODIUM: 134 mmol/L — AB (ref 135–145)

## 2016-11-23 LAB — PROTIME-INR
INR: 1.14
Prothrombin Time: 14.5 seconds (ref 11.4–15.2)

## 2016-11-23 LAB — TROPONIN I
TROPONIN I: 5.91 ng/mL — AB (ref <0.03)
TROPONIN I: 5.59 ng/mL — AB (ref <0.03)

## 2016-11-23 LAB — CBG MONITORING, ED: Glucose-Capillary: 168 mg/dL — ABNORMAL HIGH (ref 65–99)

## 2016-11-23 LAB — HIV ANTIBODY (ROUTINE TESTING W REFLEX): HIV SCREEN 4TH GENERATION: NONREACTIVE

## 2016-11-23 SURGERY — LEFT HEART CATH AND CORONARY ANGIOGRAPHY
Anesthesia: LOCAL

## 2016-11-23 MED ORDER — MORPHINE SULFATE (PF) 4 MG/ML IV SOLN
2.0000 mg | Freq: Once | INTRAVENOUS | Status: AC
  Administered 2016-11-23: 2 mg via INTRAVENOUS
  Filled 2016-11-23: qty 1

## 2016-11-23 MED ORDER — IOPAMIDOL (ISOVUE-370) INJECTION 76%
INTRAVENOUS | Status: DC | PRN
  Administered 2016-11-23: 70 mL via INTRA_ARTERIAL

## 2016-11-23 MED ORDER — HEPARIN SODIUM (PORCINE) 1000 UNIT/ML IJ SOLN
INTRAMUSCULAR | Status: AC
  Filled 2016-11-23: qty 1

## 2016-11-23 MED ORDER — VERAPAMIL HCL 2.5 MG/ML IV SOLN
INTRAVENOUS | Status: AC
  Filled 2016-11-23: qty 2

## 2016-11-23 MED ORDER — PANTOPRAZOLE SODIUM 40 MG PO TBEC
40.0000 mg | DELAYED_RELEASE_TABLET | Freq: Every day | ORAL | Status: DC
  Administered 2016-11-23 – 2016-11-25 (×3): 40 mg via ORAL
  Filled 2016-11-23 (×3): qty 1

## 2016-11-23 MED ORDER — SODIUM CHLORIDE 0.9% FLUSH: 3.0000 mL | INTRAVENOUS | Status: DC | PRN

## 2016-11-23 MED ORDER — POTASSIUM CHLORIDE CRYS ER 20 MEQ PO TBCR
40.0000 meq | EXTENDED_RELEASE_TABLET | Freq: Once | ORAL | Status: AC
  Administered 2016-11-23: 40 meq via ORAL
  Filled 2016-11-23: qty 2

## 2016-11-23 MED ORDER — SODIUM CHLORIDE 0.9% FLUSH
3.0000 mL | Freq: Two times a day (BID) | INTRAVENOUS | Status: DC
  Administered 2016-11-25: 3 mL via INTRAVENOUS

## 2016-11-23 MED ORDER — IOPAMIDOL (ISOVUE-370) INJECTION 76%
INTRAVENOUS | Status: AC
  Filled 2016-11-23: qty 100

## 2016-11-23 MED ORDER — HEPARIN (PORCINE) IN NACL 100-0.45 UNIT/ML-% IJ SOLN
1200.0000 [IU]/h | INTRAMUSCULAR | Status: DC
  Administered 2016-11-23: 1050 [IU]/h via INTRAVENOUS
  Administered 2016-11-24: 1200 [IU]/h via INTRAVENOUS
  Filled 2016-11-23 (×4): qty 250

## 2016-11-23 MED ORDER — VERAPAMIL HCL 2.5 MG/ML IV SOLN
INTRAVENOUS | Status: DC | PRN
  Administered 2016-11-23: 10 mL via INTRA_ARTERIAL

## 2016-11-23 MED ORDER — HEPARIN (PORCINE) IN NACL 2-0.9 UNIT/ML-% IJ SOLN
INTRAMUSCULAR | Status: AC | PRN
  Administered 2016-11-23: 1000 mL via INTRA_ARTERIAL

## 2016-11-23 MED ORDER — HEPARIN SODIUM (PORCINE) 1000 UNIT/ML IJ SOLN
INTRAMUSCULAR | Status: DC | PRN
  Administered 2016-11-23: 5000 [IU] via INTRAVENOUS

## 2016-11-23 MED ORDER — MIDAZOLAM HCL 2 MG/2ML IJ SOLN
INTRAMUSCULAR | Status: DC | PRN
  Administered 2016-11-23: 2 mg via INTRAVENOUS

## 2016-11-23 MED ORDER — HEPARIN (PORCINE) IN NACL 2-0.9 UNIT/ML-% IJ SOLN
INTRAMUSCULAR | Status: AC
  Filled 2016-11-23: qty 1000

## 2016-11-23 MED ORDER — INSULIN ASPART 100 UNIT/ML ~~LOC~~ SOLN
0.0000 [IU] | Freq: Three times a day (TID) | SUBCUTANEOUS | Status: DC
  Administered 2016-11-23: 3 [IU] via SUBCUTANEOUS
  Administered 2016-11-23 – 2016-11-24 (×2): 8 [IU] via SUBCUTANEOUS
  Administered 2016-11-24: 3 [IU] via SUBCUTANEOUS
  Administered 2016-11-25: 11 [IU] via SUBCUTANEOUS
  Administered 2016-11-25: 5 [IU] via SUBCUTANEOUS
  Filled 2016-11-23: qty 1

## 2016-11-23 MED ORDER — SODIUM CHLORIDE 0.9 % IV SOLN: 250.0000 mL | INTRAVENOUS | Status: DC | PRN

## 2016-11-23 MED ORDER — ASPIRIN 81 MG PO CHEW
81.0000 mg | CHEWABLE_TABLET | ORAL | Status: AC
  Administered 2016-11-23: 81 mg via ORAL
  Filled 2016-11-23: qty 1

## 2016-11-23 MED ORDER — FENTANYL CITRATE (PF) 100 MCG/2ML IJ SOLN
INTRAMUSCULAR | Status: AC
  Filled 2016-11-23: qty 2

## 2016-11-23 MED ORDER — HEPARIN BOLUS VIA INFUSION
1000.0000 [IU] | Freq: Once | INTRAVENOUS | Status: AC
  Administered 2016-11-23: 1000 [IU] via INTRAVENOUS
  Filled 2016-11-23: qty 1000

## 2016-11-23 MED ORDER — FENTANYL CITRATE (PF) 100 MCG/2ML IJ SOLN
INTRAMUSCULAR | Status: DC | PRN
  Administered 2016-11-23: 25 ug via INTRAVENOUS

## 2016-11-23 MED ORDER — MIDAZOLAM HCL 2 MG/2ML IJ SOLN
INTRAMUSCULAR | Status: AC
  Filled 2016-11-23: qty 2

## 2016-11-23 MED ORDER — LIDOCAINE HCL 2 % IJ SOLN
INTRAMUSCULAR | Status: AC
  Filled 2016-11-23: qty 10

## 2016-11-23 SURGICAL SUPPLY — 10 items
CATH IMPULSE 5F ANG/FL3.5 (CATHETERS) ×1 IMPLANT
DEVICE RAD COMP TR BAND LRG (VASCULAR PRODUCTS) ×1 IMPLANT
GLIDESHEATH SLEND SS 6F .021 (SHEATH) ×1 IMPLANT
GUIDEWIRE INQWIRE 1.5J.035X260 (WIRE) IMPLANT
INQWIRE 1.5J .035X260CM (WIRE) ×2
KIT HEART LEFT (KITS) ×2 IMPLANT
PACK CARDIAC CATHETERIZATION (CUSTOM PROCEDURE TRAY) ×2 IMPLANT
SYR MEDRAD MARK V 150ML (SYRINGE) ×2 IMPLANT
TRANSDUCER W/STOPCOCK (MISCELLANEOUS) ×2 IMPLANT
TUBING CIL FLEX 10 FLL-RA (TUBING) ×2 IMPLANT

## 2016-11-23 NOTE — Progress Notes (Signed)
ANTICOAGULATION CONSULT NOTE - Follow Up Consult  Pharmacy Consult for heparin Indication: chest pain/ACS  Allergies  Allergen Reactions  . Shellfish Allergy Hives and Swelling    And hives    Patient Measurements: Height: 5' 1"  (154.9 cm) Weight: 212 lb 8.4 oz (96.4 kg) IBW/kg (Calculated) : 47.8 Heparin Dosing Weight: 71 kg  Vital Signs: BP: 102/67 (10/23 0644) Pulse Rate: 83 (10/23 0644)  Labs:  Recent Labs  11/22/16 1127 11/22/16 2206 11/23/16 0324 11/23/16 0559  HGB 12.0  --  10.3*  --   HCT 37.3  --  32.0*  --   PLT 288  --  300  --   LABPROT  --   --  14.5  --   INR  --   --  1.14  --   HEPARINUNFRC  --   --   --  0.17*  CREATININE 0.75  --  0.82  --   TROPONINI  --  4.84* 5.91*  --     Estimated Creatinine Clearance: 90.9 mL/min (by C-G formula based on SCr of 0.82 mg/dL).  Assessment: CC/HPI: 46 yo f presenting with chest wall, arm, back pain, N/V  PMH: fibromyalgia, hypothyroid, IDDM  Anticoag: none pta - iv hep for r/o ACS. HL subtherapeutic at 0.17.  Renal: SCr 0.82   Heme/Onc: H&H down to 10.3/32, Plt 300  Goal of Therapy:  Heparin level 0.3-0.7 units/ml Monitor platelets by anticoagulation protocol: Yes   Plan:  Heparin bolus 1000 units x 1 Increase heparin infusion to 1050 units/hr Daily HL, CBC Cath planned today at 1630. F/u after cath   Albertina Parr, PharmD., BCPS Clinical Pharmacist Pager [phone]

## 2016-11-23 NOTE — Consult Note (Signed)
[address]       [place] [address]             [phone]          CARDIOTHORACIC SURGERY CONSULTATION REPORT  [facility]P is [facility] [place] Referring Provider is Sherren Mocha, MD Primary Cardiologist is Glenetta Hew, MD (new)  Reason for consultation:  Severe 3-vessel CAD s/p acute non-STEMI  HPI:  Patient is a 46 year old obese African-American female with no previous history of coronary artery disease but risk factors notable for history of insulin-dependent type 2 diabetes mellitus,hyperlipidemia, and chronic immunosuppression who has been referred for surgical consultation to discuss treatment options for management of severe three-vessel coronary artery disease status post acute non-ST segment elevation myocardial infarction.  Patient developed sudden onset dull substernal chest pain associated with nausea and vomiting and radiation to the arms yesterday morning. Symptoms became severe and persisted ultimately prompting the patient presented to the emergency department where 12-lead EKG reveals sinus rhythm without acute ST segment elevation. Troponin levels were elevated.  The patient was admitted to the hospital and started on intravenous heparin and nitroglycerin. Chest pain has improved. The patient underwent diagnostic cardiac catheterization earlier today by Dr. Burt Knack and was found to have severe three-vessel coronary artery disease with preserved left ventricular systolic function. Cardiothoracic surgical consultation was requested.  The patient is single and lives locally with her granddaughter in [place]. 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 She lives a sedentary lifestyle and does not exercise at all. She states that 3 weeks ago she had some sharp chest pain for which she was seen at urgent care in [facility] but discharged without intervention. She otherwise denies any previous history of substernal chest pain or chest tightness  other than that which brought her to the emergency room yesterday. She describes long-standing symptoms of exertional shortness of breath and severe chronic lower extremity edema. She denies any history of resting shortness of breath, PND, orthopnea, palpitations, dizzy spells, or syncope.  The patient has history of Crohn's disease which has been under fairly good control recently. She also has a remote history of vasculitis. She is chronically immunosuppressed.  Past Medical History:  Diagnosis Date  . Allergic rhinitis   . Anemia   . Arthritis    "mostly in my hands" (11/23/2016)  . Borderline high cholesterol   . Coronary artery disease   . Endometriosis   . Fibromyalgia   . GERD (gastroesophageal reflux disease)   . Grave's disease    S/P irradiation  . Hypothyroid    S/P radiation  . Left sided ulcerative colitis ([place])   . Migraine headache    "~ 4/month" (11/23/2016)  . NSTEMI (non-ST elevated myocardial infarction) ([place]) 11/22/2016  . 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 Sleep apnea    "couldn't tolerate the mask; only had a slight case of it" (11/23/2016)  . Type II diabetes mellitus ([place])   . Vasculitis [facility])     Past Surgical History:  Procedure Laterality Date  . ABDOMINAL HYSTERECTOMY  07/2006   "[place]; partial"  . CARDIAC CATHETERIZATION  11/23/2016  . CESAREAN SECTION  1994  . COLONOSCOPY W/ BIOPSIES AND POLYPECTOMY  03/2008- 2017 X "4 at least"  . DILATION AND CURETTAGE OF UTERUS  1996  . LEFT HEART CATH AND CORONARY ANGIOGRAPHY N/A 11/23/2016   Procedure: LEFT HEART CATH AND CORONARY ANGIOGRAPHY;  Surgeon: Sherren Mocha, MD;  Location: [facility];  Service: Cardiovascular;  Laterality: N/A;    Family History  Problem Relation Age of Onset  . Cervical cancer Paternal Grandmother   . Kidney disease Paternal Grandmother        ???  . Diabetes Maternal Grandmother   . Heart disease Maternal Grandmother   . Heart disease Maternal Grandfather   . Colon cancer Neg  Hx     Social History   Social History  . Marital status: Married    Spouse name: N/A  . Number of children: 2  . Years of education: N/A   Occupational History  .  coder with [employer]   . was in the [place] [employer]  . Smoking status: Never Smoker  . Smokeless tobacco: Never Used  . Alcohol use No  . Drug use: No  . Sexual activity: Not Currently   Other Topics Concern  . Not on file   Social History Narrative  . No narrative on file    Prior to Admission medications   Medication Sig Start Date End Date Taking? Authorizing Provider  atorvastatin (LIPITOR) 20 MG tablet Take 20 tablets by mouth daily.   Yes [provider]  folic acid (FOLVITE) 1 MG tablet Take 1 mg by mouth daily.   Yes [provider]  ibuprofen (ADVIL,MOTRIN) 200 MG tablet Take 400 mg by mouth every 6 (six) hours as needed for moderate pain.   Yes [provider]  insulin glargine (LANTUS) 100 UNIT/ML injection Inject 45 Units into the skin at bedtime.    Yes [provider]  levothyroxine (SYNTHROID) 100 MCG tablet Take 0.1 mcg by mouth daily.    Yes [provider]  metFORMIN (GLUMETZA) 500 MG (MOD) 24 hr tablet Take 1,000 mg by mouth daily after supper.   Yes [provider]  methotrexate (RHEUMATREX) 2.5 MG tablet Take 15 mg by mouth once a week. Caution:Chemotherapy. Protect from light.   Yes [provider]  omeprazole (PRILOSEC) 20 MG capsule Take 2 tablets by mouth once daily   Yes [provider]  sulfamethoxazole-trimethoprim (BACTRIM DS,SEPTRA DS) 800-160 MG tablet Take 1 tablet by mouth 3 (three) times a week.   Yes [provider]    Current Facility-Administered Medications  Medication Dose Route Frequency Provider Last Rate Last Dose  . 0.9 %  sodium chloride infusion  250 mL Intravenous PRN Sherren Mocha, MD 10 mL/hr at 11/23/16 1210 250 mL at 11/23/16 1210  . acetaminophen (TYLENOL)  tablet 650 mg  650 mg Oral Q4H PRN Reino Bellis B, NP   650 mg at 11/23/16 0805  . aspirin EC tablet 81 mg  81 mg Oral Daily Reino Bellis B, NP      . atorvastatin (LIPITOR) tablet 20 mg  20 mg Oral q1800 Reino Bellis B, NP   20 mg at 11/23/16 1736  . heparin ADULT infusion 100 units/mL (25000 units/237m sodium chloride 0.45%)  1,050 Units/hr Intravenous Continuous HLeonie Man MD 10.5 mL/hr at 11/23/16 1823 1,050 Units/hr at 11/23/16 1823  . insulin aspart (novoLOG) injection 0-15 Units  0-15 Units Subcutaneous TID WC RReino BellisB, NP   8 Units at 11/23/16 1734  . insulin glargine (LANTUS) injection 45 Units  45 Units Subcutaneous QHS RReino BellisB, NP   45 Units at 11/23/16 [phone] . ondansetron (ZOFRAN) injection 4 mg  4 mg Intravenous Q6H PRN RReino BellisB, NP      . pantoprazole (PROTONIX) EC tablet 40 mg  40 mg Oral Daily Reino Bellis B, NP   40 mg at 11/23/16 1736  . sodium chloride flush (NS) 0.9 % injection 3 mL  3 mL Intravenous Q12H Sherren Mocha, MD      . sodium chloride flush (NS) 0.9 % injection 3 mL  3 mL Intravenous PRN Sherren Mocha, MD        Allergies  Allergen Reactions  . Shellfish Allergy Hives and Swelling    And hives      Review of Systems:   General:  normal appetite, normal energy, no weight gain, no weight loss, no fever  Cardiac:  + chest pain with exertion, + chest pain at rest, +SOB with exertion, no resting SOB, no PND, no orthopnea, no palpitations, no arrhythmia, no atrial fibrillation, + LE edema, no dizzy spells, no syncope  Respiratory:  no shortness of breath, no home oxygen, no productive cough, no dry cough, no bronchitis, no wheezing, no hemoptysis, no asthma, no pain with inspiration or cough, no sleep apnea, no CPAP at night  GI:   no difficulty swallowing, no reflux, no frequent heartburn, no hiatal hernia, occasional abdominal pain, no constipation, occasional diarrhea, no hematochezia, no hematemesis, no  melena  GU:   no dysuria,  no frequency, no urinary tract infection, no hematuria, no kidney stones, no kidney disease  Vascular:  no pain suggestive of claudication, no pain in feet, no leg cramps, no varicose veins, no DVT, no non-healing foot ulcer  Neuro:   no stroke, no TIA's, no seizures, no headaches, no temporary blindness one eye,  no slurred speech, no peripheral neuropathy, no chronic pain, no instability of gait, no memory/cognitive dysfunction  Musculoskeletal: no arthritis, no joint swelling, no myalgias, no difficulty walking, normal mobility   Skin:   no rash, no itching, no skin infections, no pressure sores or ulcerations  Psych:   no anxiety, no depression, no nervousness, no unusual recent stress  Eyes:   no blurry vision, no floaters, no recent vision changes, + wears glasses or contacts  ENT:   no hearing loss, no loose or painful teeth, no dentures, last saw dentist within the past year  Hematologic:  no easy bruising, no abnormal bleeding, no clotting disorder, no frequent epistaxis  Endocrine:  + diabetes, does check CBG's at home     Physical Exam:   BP 116/77   Pulse 80   Temp 97.9 F (36.6 C) (Oral)   Resp (!) 22   Ht 5' 1"  (1.549 m)   Wt 237 lb 1.6 oz (107.5 kg)   SpO2 97%   BMI 44.80 kg/m   General:  Obese AA female NAD    HEENT:  Unremarkable   Neck:   no JVD, no bruits, no adenopathy   Chest:   clear to auscultation, symmetrical breath sounds, no wheezes, no rhonchi   CV:   RRR, no  murmur   Abdomen:  soft, non-tender, no masses   Extremities:  warm, well-perfused, pulses diminished, 2+ bilateral lower extremity edema  Rectal/GU  Deferred  Neuro:   Grossly non-focal and symmetrical throughout  Skin:   Clean and dry, no rashes, no breakdown  Diagnostic Tests:  LEFT HEART CATH AND CORONARY ANGIOGRAPHY  Conclusion   1. Diffusely diseased RCA with total occlusion of the distal RCA. This is the patient's culprit vessel for non-STEMI with contrast  staining at the occlusion. There are faint left to right collaterals present. 2. Severe proximal LAD stenosis, diffuse segment 3. Severe proximal  left circumflex stenosis 4. Mild segmental LV dysfunction with akinesis of the mid inferior wall and normal wall motion through the anterolateral and periapical walls.  Recommendation: We will review revascularization options. This is a diabetic patient with diffuse proximal coronary disease. At her young age, it would be reasonable to consider multivessel PCI. However, I am reluctant about obligating her to dual antiplatelet therapy for the next 12 months in the setting of ulcerative colitis. Will place a formal cardiac surgical consult for CABG and further discussion of best revascularization strategy. Will resume IV heparin after her TR band is out. Will hold on oral antiplatelet therapy other than aspirin in case she is ultimately treated with CABG.  Indications   Non-ST elevation (NSTEMI) myocardial infarction ([place]) [I21.4 (ICD-10-CM)]  Procedural Details/Technique   Technical Details INDICATION: NSTEMI  PROCEDURAL DETAILS: The right wrist was prepped, draped, and anesthetized with 1% lidocaine. Using the modified Seldinger technique, a 5/6 French Slender sheath was introduced into the right radial artery. 3 mg of verapamil was administered through the sheath, weight-based unfractionated heparin was administered intravenously. Standard Judkins catheters were used for selective coronary angiography and left ventriculography. Catheter exchanges were performed over an exchange length guidewire. There were no immediate procedural complications. A TR band was used for radial hemostasis at the completion of the procedure. The patient was transferred to the post catheterization recovery area for further monitoring.    Estimated blood loss <50 mL.  During this procedure the patient was administered the following to achieve and maintain moderate conscious  sedation: Versed 2 mg, Fentanyl 25 mcg, while the patient's heart rate, blood pressure, and oxygen saturation were continuously monitored. The period of conscious sedation was 40 minutes, of which I was present face-to-face 100% of this time.    Coronary Findings   Dominance: Right  Left Main  Vessel is angiographically normal.  Left Anterior Descending  Prox LAD lesion, 75% stenosed.  Prox LAD to Mid LAD lesion, 75% stenosed.  First Diagonal Branch  Ost 1st Diag to 1st Diag lesion, 70% stenosed.  1st Diag lesion, 50% stenosed.  Ramus Intermedius  Vessel is small.  Left Circumflex  Prox Cx-1 lesion, 80% stenosed. The lesion is focal.  Prox Cx-2 lesion, 50% stenosed. The lesion is eccentric.  Right Coronary Artery  Prox RCA lesion, 60% stenosed.  Mid RCA lesion, 60% stenosed.  Dist RCA lesion, 100% stenosed. The lesion is thrombotic.  Wall Motion              Left Heart   Left Ventricle The left ventricular size is normal. There is mild left ventricular systolic dysfunction. LV end diastolic pressure is mildly elevated. The left ventricular ejection fraction is 50-55% by visual estimate. There are LV function abnormalities due to segmental dysfunction. The mid inferior wall is akinetic. The basal inferior wall is hypokinetic. The LVEF is estimated at 55%.    Coronary Diagrams   Diagnostic Diagram       Implants     No implant documentation for this case.  MERGE Images   Show images for CARDIAC CATHETERIZATION   Link to Procedure Log   Procedure Log    Hemo Data    Most Recent Value  AO Systolic Pressure 099 mmHg  AO Diastolic Pressure 72 mmHg  AO Mean 90 mmHg  LV Systolic Pressure 833 mmHg  LV Diastolic Pressure 9 mmHg  LV EDP 19 mmHg  Arterial Occlusion Pressure Extended Systolic Pressure 825 mmHg  Arterial Occlusion Pressure Extended Diastolic  Pressure 71 mmHg  Arterial Occlusion Pressure Extended Mean Pressure 88 mmHg  Left Ventricular Apex Extended  Systolic Pressure 282 mmHg  Left Ventricular Apex Extended Diastolic Pressure 15 mmHg  Left Ventricular Apex Extended EDP Pressure 24 mmHg      Impression:  Patient has severe three-vessel coronary artery disease with preserved left ventricular systolic function. She presents with acute coronary syndrome and has ruled in for an acute non-ST segment elevation myocardial infarction. I personally reviewed the patient's diagnostic cardiac catheterization performed earlier today by Dr. Burt Knack. I agree the patient would probably best be treated with surgical revascularization.  Risks associated with surgery should be reasonably low although elevated somewhat by the patient's comorbid medical problems and history of chronic immunosuppression for vasculitis and Crohn's disease  Plan:  I have reviewed the indications, risks, and potential benefits of coronary artery bypass grafting with the patient and her mother at the bedside.  Alternative treatment strategies have been discussed, including the relative risks, benefits and long term prognosis associated with medical therapy, percutaneous coronary intervention, and surgical revascularization.  The patient understands and accepts all potential associated risks of surgery including but not limited to risk of death, stroke or other neurologic complication, myocardial infarction, congestive heart failure, respiratory failure, renal failure, bleeding requiring blood transfusion and/or reexploration, aortic dissection or other major vascular complication, arrhythmia, heart block or bradycardia requiring permanent pacemaker, pneumonia, pleural effusion, wound infection, pulmonary embolus or other thromboembolic complication, chronic pain or other delayed complications related to median sternotomy, or the late recurrence of symptomatic ischemic heart disease and/or congestive heart failure.  The importance of long term risk modification have been emphasized.  All  questions answered.  We tentatively plan to proceed with coronary artery bypass grafting on Friday, 11/26/2016.   I spent in excess of 120 minutes during the conduct of this hospital consultation and >50% of this time involved direct face-to-face encounter for counseling and/or coordination of the patient's care.   Valentina Gu. Roxy Manns, MD 11/23/2016 7:16 PM

## 2016-11-23 NOTE — Progress Notes (Signed)
ANTICOAGULATION CONSULT NOTE - Follow Up Consult  Pharmacy Consult for heparin Indication: chest pain/ACS  Allergies  Allergen Reactions  . Shellfish Allergy Hives and Swelling    And hives    Patient Measurements: Height: 5' 1"  (154.9 cm) Weight: 237 lb 1.6 oz (107.5 kg) IBW/kg (Calculated) : 47.8 Heparin Dosing Weight: 71 kg  Vital Signs: Temp: 97.4 F (36.3 C) (10/23 1140) Temp Source: Oral (10/23 1140) BP: 114/80 (10/23 1235) Pulse Rate: 75 (10/23 1235)  Labs:  Recent Labs  11/22/16 1127 11/22/16 2206 11/23/16 0324 11/23/16 0559 11/23/16 1131  HGB 12.0  --  10.3*  --   --   HCT 37.3  --  32.0*  --   --   PLT 288  --  300  --   --   LABPROT  --   --  14.5  --   --   INR  --   --  1.14  --   --   HEPARINUNFRC  --   --   --  0.17*  --   CREATININE 0.75  --  0.82  --   --   TROPONINI  --  4.84* 5.91*  --  5.59*    Estimated Creatinine Clearance: 97 mL/min (by C-G formula based on SCr of 0.82 mg/dL).  Assessment: 45 yof presenting with CP, on heparin for ACS. Cath report showed diffusely diseased RCA, with total occlusion of distal RCA in addition to severe LAD and LCx stenosis. Given hx of ulcerative colitis and DAPT, plan to consult [facility] for revascularization options.   Heparin level this morning came back at 0.17, below goal range and rate had been increased to 1050 units/hr. Patient had gone to cath prior to next level being able to be drawn. Hgb is down slightly (12>10.3) with platelets within normal range. Consult to restart heparin 4 hours after TR deflated, which occurred at 1347.   Goal of Therapy:  Heparin level 0.3-0.7 units/ml Monitor platelets by anticoagulation protocol: Yes   Plan:  Restart heparin infusion at 1050 units/hr at 1800 Obtain heparin level 6 hours after restart Daily HL, CBC Follow-up with plan per CTVS  Doylene Canard, PharmD Clinical Pharmacist  Pager: [phone] Clinical Phone for 11/23/2016 until 3:30pm: [phone] If after  3:30pm, please call main pharmacy at [phone]

## 2016-11-23 NOTE — Interval H&P Note (Signed)
Cath Lab Visit (complete for each Cath Lab visit)  Clinical Evaluation Leading to the Procedure:   ACS: Yes.    Non-ACS:    Anginal Classification: CCS IV  Anti-ischemic medical therapy: Minimal Therapy (1 class of medications)  Non-Invasive Test Results: No non-invasive testing performed  Prior CABG: No previous CABG      History and Physical Interval Note:  11/23/2016 10:06 AM  [name] [name]  has presented today for surgery, with the diagnosis of n stemi  The various methods of treatment have been discussed with the patient and family. After consideration of risks, benefits and other options for treatment, the patient has consented to  Procedure(s): LEFT HEART CATH AND CORONARY ANGIOGRAPHY (N/A) as a surgical intervention .  The patient's history has been reviewed, patient examined, no change in status, stable for surgery.  I have reviewed the patient's chart and labs.  Questions were answered to the patient's satisfaction.     Sherren Mocha

## 2016-11-24 ENCOUNTER — Telehealth: Payer: Self-pay | Admitting: Cardiology

## 2016-11-24 DIAGNOSIS — I25110 Atherosclerotic heart disease of native coronary artery with unstable angina pectoris: Secondary | ICD-10-CM

## 2016-11-24 DIAGNOSIS — I10 Essential (primary) hypertension: Secondary | ICD-10-CM

## 2016-11-24 DIAGNOSIS — E118 Type 2 diabetes mellitus with unspecified complications: Secondary | ICD-10-CM

## 2016-11-24 DIAGNOSIS — Z794 Long term (current) use of insulin: Secondary | ICD-10-CM

## 2016-11-24 LAB — CBC
HCT: 32 % — ABNORMAL LOW (ref 36.0–46.0)
HEMOGLOBIN: 10.4 g/dL — AB (ref 12.0–15.0)
MCH: 29.4 pg (ref 26.0–34.0)
MCHC: 32.5 g/dL (ref 30.0–36.0)
MCV: 90.4 fL (ref 78.0–100.0)
PLATELETS: 290 10*3/uL (ref 150–400)
RBC: 3.54 MIL/uL — AB (ref 3.87–5.11)
RDW: 14.7 % (ref 11.5–15.5)
WBC: 10.8 10*3/uL — AB (ref 4.0–10.5)

## 2016-11-24 LAB — BLOOD GAS, ARTERIAL
ACID-BASE DEFICIT: 1.7 mmol/L (ref 0.0–2.0)
Bicarbonate: 21.9 mmol/L (ref 20.0–28.0)
DRAWN BY: 414221
FIO2: 0.21
O2 SAT: 96.5 %
PATIENT TEMPERATURE: 98.6
PCO2 ART: 32.7 mmHg (ref 32.0–48.0)
PO2 ART: 80.8 mmHg — AB (ref 83.0–108.0)
pH, Arterial: 7.44 (ref 7.350–7.450)

## 2016-11-24 LAB — HEMOGLOBIN A1C
HEMOGLOBIN A1C: 9.4 % — AB (ref 4.8–5.6)
HEMOGLOBIN A1C: 9.4 % — AB (ref 4.8–5.6)
MEAN PLASMA GLUCOSE: 223.08 mg/dL
Mean Plasma Glucose: 223 mg/dL

## 2016-11-24 LAB — COMPREHENSIVE METABOLIC PANEL
ALBUMIN: 3.1 g/dL — AB (ref 3.5–5.0)
ALK PHOS: 99 U/L (ref 38–126)
ALT: 23 U/L (ref 14–54)
AST: 56 U/L — ABNORMAL HIGH (ref 15–41)
Anion gap: 9 (ref 5–15)
BUN: 7 mg/dL (ref 6–20)
CALCIUM: 8.4 mg/dL — AB (ref 8.9–10.3)
CHLORIDE: 107 mmol/L (ref 101–111)
CO2: 22 mmol/L (ref 22–32)
CREATININE: 0.7 mg/dL (ref 0.44–1.00)
GFR calc non Af Amer: >60 mL/min (ref >60)
GLUCOSE: 101 mg/dL — AB (ref 65–99)
Potassium: 3.2 mmol/L — ABNORMAL LOW (ref 3.5–5.1)
SODIUM: 138 mmol/L (ref 135–145)
Total Bilirubin: 0.7 mg/dL (ref 0.3–1.2)
Total Protein: 6.5 g/dL (ref 6.5–8.1)

## 2016-11-24 LAB — GLUCOSE, CAPILLARY
Glucose-Capillary: 84 mg/dL (ref 65–99)
Glucose-Capillary: 162 mg/dL — ABNORMAL HIGH (ref 65–99)
Glucose-Capillary: 259 mg/dL — ABNORMAL HIGH (ref 65–99)
Glucose-Capillary: 166 mg/dL — ABNORMAL HIGH (ref 65–99)

## 2016-11-24 LAB — PREALBUMIN: Prealbumin: 15.2 mg/dL — ABNORMAL LOW (ref 18–38)

## 2016-11-24 LAB — PROTIME-INR
INR: 1.12
PROTHROMBIN TIME: 14.3 s (ref 11.4–15.2)

## 2016-11-24 LAB — APTT: APTT: 75 s — AB (ref 24–36)

## 2016-11-24 LAB — HEPARIN LEVEL (UNFRACTIONATED)
HEPARIN UNFRACTIONATED: 0.33 [IU]/mL (ref 0.30–0.70)
Heparin Unfractionated: 0.17 [IU]/mL — ABNORMAL LOW (ref 0.30–0.70)

## 2016-11-24 LAB — TSH: TSH: 48.374 u[IU]/mL — ABNORMAL HIGH (ref 0.350–4.500)

## 2016-11-24 LAB — T4, FREE: Free T4: 0.51 ng/dL — ABNORMAL LOW (ref 0.61–1.12)

## 2016-11-24 MED ORDER — TRAMADOL HCL 50 MG PO TABS
50.0000 mg | ORAL_TABLET | Freq: Four times a day (QID) | ORAL | Status: DC | PRN
  Administered 2016-11-24 – 2016-11-25 (×2): 50 mg via ORAL
  Filled 2016-11-24 (×2): qty 1

## 2016-11-24 MED ORDER — LEVOTHYROXINE SODIUM 100 MCG PO TABS
100.0000 ug | ORAL_TABLET | Freq: Every day | ORAL | Status: DC
  Administered 2016-11-24 – 2016-11-25 (×2): 100 ug via ORAL
  Filled 2016-11-24 (×2): qty 1

## 2016-11-24 MED ORDER — POTASSIUM CHLORIDE CRYS ER 20 MEQ PO TBCR
40.0000 meq | EXTENDED_RELEASE_TABLET | Freq: Once | ORAL | Status: AC
  Administered 2016-11-24: 40 meq via ORAL
  Filled 2016-11-24: qty 2

## 2016-11-24 MED ORDER — OXYCODONE-ACETAMINOPHEN 5-325 MG PO TABS
1.0000 | ORAL_TABLET | Freq: Once | ORAL | Status: AC
  Administered 2016-11-24: 1 via ORAL
  Filled 2016-11-24: qty 1

## 2016-11-24 MED ORDER — ATORVASTATIN CALCIUM 80 MG PO TABS
80.0000 mg | ORAL_TABLET | Freq: Every day | ORAL | Status: DC
  Administered 2016-11-24 – 2016-11-25 (×2): 80 mg via ORAL
  Filled 2016-11-24 (×3): qty 1

## 2016-11-24 MED ORDER — METOPROLOL TARTRATE 25 MG PO TABS
25.0000 mg | ORAL_TABLET | Freq: Two times a day (BID) | ORAL | Status: DC
  Administered 2016-11-24 – 2016-11-25 (×3): 25 mg via ORAL
  Filled 2016-11-24 (×3): qty 1

## 2016-11-24 MED ORDER — ISOSORBIDE MONONITRATE ER 30 MG PO TB24
30.0000 mg | ORAL_TABLET | Freq: Every day | ORAL | Status: DC
  Administered 2016-11-24: 30 mg via ORAL
  Filled 2016-11-24: qty 1

## 2016-11-24 MED FILL — Lidocaine HCl Local Inj 2%: INTRAMUSCULAR | Qty: 10 | Status: AC

## 2016-11-24 NOTE — Progress Notes (Signed)
Inpatient Diabetes Program Recommendations  AACE/ADA: New Consensus Statement on Inpatient Glycemic Control (2015)  Target Ranges:  Prepandial:   less than 140 mg/dL      Peak postprandial:   less than 180 mg/dL (1-2 hours)      Critically ill patients:  140 - 180 mg/dL   Lab Results  Component Value Date   GLUCAP 162 (H) 11/24/2016   HGBA1C 9.4 (H) 11/24/2016    Inpatient Diabetes Program Recommendations:    Spoke with pt about A1C results with them 9.4 (average blood glucose 223 over the past 2-3 months) and explained what an A1C is, basic pathophysiology of DM Type 2, basic home care, basic diabetes diet nutrition principles, importance of checking CBGs and maintaining good CBG control to prevent long-term and short-term complications. Patient shared she has a continuous glucose monitor on from a recent start as outpatient. Discussed 10 minute delay in CBG meter check along with variability in results when compared to continuous glucose monitor. Noted patient has planned surgery. Will follow.  Thank you, [name] [name]. Suad Autrey, RN, MSN, CDE  Diabetes Coordinator Inpatient Glycemic Control Team Team Pager [phone] (8am-5pm) 11/24/2016 3:06 PM

## 2016-11-24 NOTE — Plan of Care (Signed)
Problem: Education: Goal: Knowledge of [facility] Education information/materials will improve Outcome: Progressing POC reviewed with pt.; pt. to have surgery sometime this week; education channel list given to pt. to watch videos #112- preparing for heart surgery, #113- recovering from heart surgery, and #106- women and heart disease. She stated she will watch them.

## 2016-11-24 NOTE — Progress Notes (Signed)
Offered Pt a bath. Pt stated she is waiting to see if she can get a Doctor order to get in the shower. Tech will check with RN regarding shower order.

## 2016-11-24 NOTE — Progress Notes (Signed)
ANTICOAGULATION CONSULT NOTE - Follow Up Consult  Pharmacy Consult for Heparin Indication: 3VCAD  Allergies  Allergen Reactions  . Shellfish Allergy Hives and Swelling    And hives    Patient Measurements: Height: 5' 1"  (154.9 cm) Weight: 217 lb 12.8 oz (98.8 kg) IBW/kg (Calculated) : 47.8 Heparin Dosing Weight:  74.1 kg  Vital Signs: Temp: 98 F (36.7 C) (10/24 0820) Temp Source: Oral (10/24 0820) BP: 117/70 (10/24 0820) Pulse Rate: 89 (10/24 0820)  Labs:  Recent Labs  11/22/16 1127 11/22/16 2206 11/23/16 0324 11/23/16 0559 11/23/16 1131 11/23/16 2350 11/24/16 0524 11/24/16 0902  HGB 12.0  --  10.3*  --   --   --  10.4*  --   HCT 37.3  --  32.0*  --   --   --  32.0*  --   PLT 288  --  300  --   --   --  290  --   APTT  --   --   --   --   --   --  75*  --   LABPROT  --   --  14.5  --   --   --  14.3  --   INR  --   --  1.14  --   --   --  1.12  --   HEPARINUNFRC  --   --   --  0.17*  --  0.17*  --  0.33  CREATININE 0.75  --  0.82  --   --   --  0.70  --   TROPONINI  --  4.84* 5.91*  --  5.59*  --   --   --     Estimated Creatinine Clearance: 94.6 mL/min (by C-G formula based on SCr of 0.7 mg/dL).    Assessment:  Anticoag: NSTEMI> 3VCAD. IV heparin. HL 0.33 in goal.  Hlgb 10.4. Plts 290 stable  TSH 48.3 (home Synthroid not resumed)  K+ 3.4>3.2 this AM   Goal of Therapy:  Heparin level 0.3-0.7 units/ml Monitor platelets by anticoagulation protocol: Yes   Plan:  Inc heparin to 1200 units/hr Tentative CABG 10/26 F/u restart of home meds F/u K+ replacement F/u to resume home Synthroid   Karuna Balducci S. Alford Highland, PharmD, BCPS Clinical Staff Pharmacist Pager [phone]  Eilene Ghazi Stillinger 11/24/2016,10:28 AM

## 2016-11-24 NOTE — Telephone Encounter (Signed)
Spoke with the [facility], questions regarding upcoming surgery answered to the best of my ability.

## 2016-11-24 NOTE — Progress Notes (Signed)
ANTICOAGULATION CONSULT NOTE - Follow Up Consult  Pharmacy Consult for heparin Indication: chest pain/ACS  Allergies  Allergen Reactions  . Shellfish Allergy Hives and Swelling    And hives    Patient Measurements: Height: 5' 1"  (154.9 cm) Weight: 237 lb 1.6 oz (107.5 kg) IBW/kg (Calculated) : 47.8 Heparin Dosing Weight: 71 kg  Vital Signs: Temp: 98.3 F (36.8 C) (10/23 2350) Temp Source: Oral (10/23 2350) BP: 144/88 (10/23 2350) Pulse Rate: 100 (10/23 2350)  Labs:  Recent Labs  11/22/16 1127 11/22/16 2206 11/23/16 0324 11/23/16 0559 11/23/16 1131 11/23/16 2350  HGB 12.0  --  10.3*  --   --   --   HCT 37.3  --  32.0*  --   --   --   PLT 288  --  300  --   --   --   LABPROT  --   --  14.5  --   --   --   INR  --   --  1.14  --   --   --   HEPARINUNFRC  --   --   --  0.17*  --  0.17*  CREATININE 0.75  --  0.82  --   --   --   TROPONINI  --  4.84* 5.91*  --  5.59*  --     Estimated Creatinine Clearance: 97 mL/min (by C-G formula based on SCr of 0.82 mg/dL).  Assessment: 55 yof presenting with CP, on heparin for ACS. Cath report showed diffusely diseased RCA, with total occlusion of distal RCA in addition to severe LAD and LCx stenosis. Given hx of ulcerative colitis and DAPT, plan to consult [facility]VS for revascularization options.  Tentative plan for CABG 10/26  Heparin level low at 0.17, no issues per RN   Goal of Therapy:  Heparin level 0.3-0.7 units/ml Monitor platelets by anticoagulation protocol: Yes   Plan:  -Inc heparin to 1200 units/hr -0900 HL -Tentative CABG 10/26  Narda Bonds, PharmD, [place] Clinical Pharmacist Phone: [phone]

## 2016-11-24 NOTE — Progress Notes (Addendum)
Progress Note  Patient Name: [name] [name] Date of Encounter: 11/24/2016 Primary cares to the [facility]  Primary Cardiologist: Glenetta Hew, MD  Subjective   She actually feels pretty good today. Intermittently has had some chest discomfort overnight, but stable. She is just upset because she is trying to figure out her insurance status. Apparently the case manager was unable to document her [facility] status.  Inpatient Medications    Scheduled Meds: . aspirin EC  81 mg Oral Daily  . atorvastatin  20 mg Oral q1800  . insulin aspart  0-15 Units Subcutaneous TID WC  . insulin glargine  45 Units Subcutaneous QHS  . isosorbide mononitrate  30 mg Oral Daily  . metoprolol tartrate  25 mg Oral BID  . pantoprazole  40 mg Oral Daily  . sodium chloride flush  3 mL Intravenous Q12H   Continuous Infusions: . sodium chloride 250 mL (11/23/16 1210)  . heparin 1,200 Units/hr (11/24/16 0100)   PRN Meds: sodium chloride, acetaminophen, ondansetron (ZOFRAN) IV, sodium chloride flush   Vital Signs    Vitals:   11/24/16 0400 11/24/16 0500 11/24/16 0820 11/24/16 1136  BP: 138/76 134/82 117/70 113/72  Pulse: 87 91 89 84  Resp: 18 (!) 21 20 18   Temp: 98.1 F (36.7 C)  98 F (36.7 C) 98 F (36.7 C)  TempSrc: Oral  Oral Oral  SpO2: 96% 97% 98% 99%  Weight: 217 lb 12.8 oz (98.8 kg)     Height:        Intake/Output Summary (Last 24 hours) at 11/24/16 1143 Last data filed at 11/24/16 1000  Gross per 24 hour  Intake              571 ml  Output                0 ml  Net              571 ml   Filed Weights   11/22/16 1600 11/23/16 1140 11/24/16 0400  Weight: 212 lb 8.4 oz (96.4 kg) 237 lb 1.6 oz (107.5 kg) 217 lb 12.8 oz (98.8 kg)    Telemetry    Sinus rhythm with rates of 80s to 90s. Occasional PVCs - Personally Reviewed  ECG    No new EKG - Personally Reviewed  Physical Exam   Physical Exam  Constitutional: She is oriented to person, place, and time. She appears well-developed and  well-nourished. No distress.  Moderately obese  HENT:  Head: Normocephalic and atraumatic.  Mouth/Throat: No oropharyngeal exudate.  Eyes: EOM are normal.  Neck: No hepatojugular reflux and no JVD present. Carotid bruit is not present.  Cardiovascular: Normal rate, regular rhythm and intact distal pulses.  Exam reveals distant heart sounds. Exam reveals no gallop, no friction rub and no decreased pulses.   No murmur heard. Pulmonary/Chest: Effort normal and breath sounds normal. No respiratory distress. She has no wheezes. She has no rales.  Abdominal: Soft. Bowel sounds are normal. She exhibits no distension. There is no tenderness.  Musculoskeletal: Normal range of motion. She exhibits no edema.  Neurological: She is alert and oriented to person, place, and time.  Skin: Skin is warm and dry. No rash noted. No erythema. No pallor.  Psychiatric: She has a normal mood and affect. Her behavior is normal. Judgment and thought content normal.  Nursing note and vitals reviewed.  Labs    Chemistry  Recent Labs Lab 11/22/16 1127 11/23/16 0324 11/24/16 0524  NA 134*  134* 138  K 4.1 3.4* 3.2*  CL 107 106 107  CO2 17* 20* 22  GLUCOSE 245* 190* 101*  BUN 10 9 7   CREATININE 0.75 0.82 0.70  CALCIUM 8.7* 7.9* 8.4*  PROT 7.1  --  6.5  ALBUMIN 3.8  --  3.1*  AST 28  --  56*  ALT 21  --  23  ALKPHOS 110  --  99  BILITOT 0.5  --  0.7  GFRNONAA >60 >60 >60  GFRAA >60 >60 >60  ANIONGAP 10 8 9      Hematology  Recent Labs Lab 11/22/16 1127 11/23/16 0324 11/24/16 0524  WBC 11.3* 10.6* 10.8*  RBC 4.11 3.54* 3.54*  HGB 12.0 10.3* 10.4*  HCT 37.3 32.0* 32.0*  MCV 90.8 90.4 90.4  MCH 29.2 29.1 29.4  MCHC 32.2 32.2 32.5  RDW 14.5 14.3 14.7  PLT 288 300 290    Cardiac Enzymes  Recent Labs Lab 11/22/16 2206 11/23/16 0324 11/23/16 1131  TROPONINI 4.84* 5.91* 5.59*     Recent Labs Lab 11/22/16 0950 11/22/16 1346  TROPIPOC 0.02 0.55*     BNPNo results for input(s): BNP,  PROBNP in the last 168 hours.   DDimer No results for input(s): DDIMER in the last 168 hours.   Radiology    No results found.  Cardiac Studies   LEFT HEART CATH AND CORONARY ANGIOGRAPHY: 11/24/2016   1. Diffusely diseased RCA with total occlusion of the distal RCA. This is the patient's culprit vessel for non-STEMI with contrast staining at the occlusion. There are faint left to right collaterals present. 2. Severe proximal LAD stenosis, diffuse segment 3. Severe proximal left circumflex stenosis 4. Mild segmental LV dysfunction with akinesis of the mid inferior wall and normal wall motion through the anterolateral and periapical walls.  Recommendation: We will review revascularization options. This is a diabetic patient with diffuse proximal coronary disease. At her young age, it would be reasonable to consider multivessel PCI. However, I am reluctant about obligating her to dual antiplatelet therapy for the next 12 months in the setting of ulcerative colitis. Will place a formal cardiac surgical consult for CABG and further discussion of best revascularization strategy. Will resume IV heparin after her TR band is out. Will hold on oral antiplatelet therapy other than aspirin in case she is ultimately treated with CABG.   Wall Motion           Patient Profile     46 y.o. female PMH of DM-2 on Insuling, Ulcerative Colitis, Graves Dz (with hypothyroidism), ? Vasculitis & endometriosis who presented on 10/22 with stuttering SSx of ACS/NSTEMI.   Cardiac cath on 10/23 revealed severe MV CAD -- best option is CABG  Assessment & Plan    Principal Problem:   NSTEMI (non-ST elevated myocardial infarction) [facility]) Active Problems:   Coronary artery disease involving native coronary artery of native heart with unstable angina pectoris ([place])   Hyperlipidemia due to type 2 diabetes mellitus ([place])   Essential hypertension   Diabetes mellitus type II, uncontrolled ([place])  Principal Problem:    NSTEMI (non-ST elevated myocardial infarction) ([place]) --  Coronary artery disease involving native coronary artery of native heart with unstable angina pectoris ([place]).  Still having intermittent chest pain. Was not restarted on IV nitroglycerin. I will start Imdur and low-dose metoprolol 25 mg twice a day.  Continue IV heparin  Appreciate consultation by Dr. Roxy Manns - currently scheduled for CABG on Friday  Unfortunately, she is trying to work  out her insurance issues to ensure that she will be able to stay here to have her procedure done. She indicates that she would not want to move anywhere else i.e. not to [facility] or to [facility]  Active Problems:    Hyperlipidemia due to type 2 diabetes mellitus ([place]) - was on 20 mg atorvastatin.  Will increase dose to 80.    Essential hypertension - was previously not on any medications. Currently has blood pressures in the 1 4150 range.   We will add metoprolol 25 mg twice a day.    Diabetes mellitus type II, uncontrolled ([place]) -   on Lantus plus sliding scale insulin. Relatively stable questionable control.   Metformin on hold    Hypothyroidism: TSH ~ 48.4 (will check Free T4 & T3 levels.  Restart home Levothyroxine    For questions or updates, please contact [place] Please consult [email or web address] for contact info under Cardiology/STEMI.      Signed, Glenetta Hew, MD  11/24/2016, 11:43 AM

## 2016-11-24 NOTE — Telephone Encounter (Signed)
New message    Gerald Stabs from [employer] is calling asking for a call back. He states that she has two children in the [place] who are trying to come home for her procedure and they need information regarding her medical condition.

## 2016-11-25 ENCOUNTER — Inpatient Hospital Stay (HOSPITAL_COMMUNITY): Payer: PRIVATE HEALTH INSURANCE

## 2016-11-25 DIAGNOSIS — Z01810 Encounter for preprocedural cardiovascular examination: Secondary | ICD-10-CM

## 2016-11-25 DIAGNOSIS — E039 Hypothyroidism, unspecified: Secondary | ICD-10-CM

## 2016-11-25 DIAGNOSIS — E119 Type 2 diabetes mellitus without complications: Secondary | ICD-10-CM

## 2016-11-25 LAB — URINALYSIS, ROUTINE W REFLEX MICROSCOPIC
BACTERIA UA: NONE SEEN
Bilirubin Urine: NEGATIVE
Glucose, UA: >=500 mg/dL — AB
Hgb urine dipstick: NEGATIVE
KETONES UR: 5 mg/dL — AB
Leukocytes, UA: NEGATIVE
NITRITE: NEGATIVE
PROTEIN: 30 mg/dL — AB
Specific Gravity, Urine: 1.031 — ABNORMAL HIGH (ref 1.005–1.030)
pH: 5 (ref 5.0–8.0)

## 2016-11-25 LAB — PULMONARY FUNCTION TEST
FEF 25-75 POST: 1.63 L/s
FEF 25-75 PRE: 1.7 L/s
FEF2575-%CHANGE-POST: -4 %
FEF2575-%PRED-POST: 68 %
FEF2575-%PRED-PRE: 72 %
FEV1-%Change-Post: -2 %
FEV1-%PRED-POST: 66 %
FEV1-%Pred-Pre: 68 %
FEV1-POST: 1.41 L
FEV1-Pre: 1.46 L
FEV1FVC-%CHANGE-POST: 1 %
FEV1FVC-%PRED-PRE: 101 %
FEV6-%CHANGE-POST: -4 %
FEV6-%Pred-Post: 65 %
FEV6-%Pred-Pre: 68 %
FEV6-Post: 1.66 L
FEV6-Pre: 1.74 L
FEV6FVC-%CHANGE-POST: 0 %
FEV6FVC-%Pred-Post: 103 %
FEV6FVC-%Pred-Pre: 102 %
FVC-%Change-Post: -4 %
FVC-%Pred-Post: 63 %
FVC-%Pred-Pre: 66 %
FVC-Post: 1.67 L
FVC-Pre: 1.75 L
POST FEV1/FVC RATIO: 85 %
PRE FEV1/FVC RATIO: 83 %
Post FEV6/FVC ratio: 100 %
Pre FEV6/FVC Ratio: 99 %

## 2016-11-25 LAB — GLUCOSE, CAPILLARY
GLUCOSE-CAPILLARY: 95 mg/dL (ref 65–99)
Glucose-Capillary: 237 mg/dL — ABNORMAL HIGH (ref 65–99)
Glucose-Capillary: 307 mg/dL — ABNORMAL HIGH (ref 65–99)
Glucose-Capillary: 280 mg/dL — ABNORMAL HIGH (ref 65–99)

## 2016-11-25 LAB — CBC
HEMATOCRIT: 31 % — AB (ref 36.0–46.0)
Hemoglobin: 9.9 g/dL — ABNORMAL LOW (ref 12.0–15.0)
MCH: 29.1 pg (ref 26.0–34.0)
MCHC: 31.9 g/dL (ref 30.0–36.0)
MCV: 91.2 fL (ref 78.0–100.0)
Platelets: 286 10*3/uL (ref 150–400)
RBC: 3.4 MIL/uL — ABNORMAL LOW (ref 3.87–5.11)
RDW: 14.8 % (ref 11.5–15.5)
WBC: 10.9 10*3/uL — ABNORMAL HIGH (ref 4.0–10.5)

## 2016-11-25 LAB — PROTIME-INR
INR: 1.04
PROTHROMBIN TIME: 13.5 s (ref 11.4–15.2)

## 2016-11-25 LAB — ABO/RH: ABO/RH(D): A POS

## 2016-11-25 LAB — SURGICAL PCR SCREEN
MRSA, PCR: NEGATIVE
Staphylococcus aureus: NEGATIVE

## 2016-11-25 LAB — APTT: aPTT: 82 seconds — ABNORMAL HIGH (ref 24–36)

## 2016-11-25 LAB — HEPARIN LEVEL (UNFRACTIONATED): HEPARIN UNFRACTIONATED: 0.59 [IU]/mL (ref 0.30–0.70)

## 2016-11-25 LAB — T3, FREE: T3 FREE: 1.7 pg/mL — AB (ref 2.0–4.4)

## 2016-11-25 MED ORDER — PAPAVERINE HCL 30 MG/ML IJ SOLN
INTRAMUSCULAR | Status: AC
  Administered 2016-11-26: 500 mL
  Filled 2016-11-25: qty 2.5

## 2016-11-25 MED ORDER — DEXTROSE 5 % IV SOLN
750.0000 mg | INTRAVENOUS | Status: DC
  Filled 2016-11-25: qty 750

## 2016-11-25 MED ORDER — CHLORHEXIDINE GLUCONATE 4 % EX LIQD
60.0000 mL | Freq: Once | CUTANEOUS | Status: AC
  Administered 2016-11-26: 4 via TOPICAL

## 2016-11-25 MED ORDER — INSULIN ASPART 100 UNIT/ML ~~LOC~~ SOLN
3.0000 [IU] | Freq: Three times a day (TID) | SUBCUTANEOUS | Status: DC
  Administered 2016-11-25: 3 [IU] via SUBCUTANEOUS

## 2016-11-25 MED ORDER — SODIUM CHLORIDE 0.9 % IV SOLN
30.0000 ug/min | INTRAVENOUS | Status: DC
  Filled 2016-11-25: qty 2

## 2016-11-25 MED ORDER — CHLORHEXIDINE GLUCONATE 0.12 % MT SOLN
15.0000 mL | Freq: Once | OROMUCOSAL | Status: AC
  Administered 2016-11-26: 15 mL via OROMUCOSAL

## 2016-11-25 MED ORDER — METOPROLOL TARTRATE 12.5 MG HALF TABLET
12.5000 mg | ORAL_TABLET | Freq: Once | ORAL | Status: AC
  Administered 2016-11-26: 12.5 mg via ORAL
  Filled 2016-11-25: qty 1

## 2016-11-25 MED ORDER — DEXAMETHASONE SODIUM PHOSPHATE 10 MG/ML IJ SOLN
10.0000 mg | Freq: Once | INTRAMUSCULAR | Status: AC
  Administered 2016-11-25: 10 mg via INTRAVENOUS
  Filled 2016-11-25: qty 1

## 2016-11-25 MED ORDER — VANCOMYCIN HCL 10 G IV SOLR
1500.0000 mg | INTRAVENOUS | Status: DC
  Filled 2016-11-25: qty 1500

## 2016-11-25 MED ORDER — DEXTROSE 5 % IV SOLN
1.5000 g | INTRAVENOUS | Status: DC
  Filled 2016-11-25 (×2): qty 1.5

## 2016-11-25 MED ORDER — DOPAMINE-DEXTROSE 3.2-5 MG/ML-% IV SOLN
0.0000 ug/kg/min | INTRAVENOUS | Status: DC
  Filled 2016-11-25: qty 250

## 2016-11-25 MED ORDER — METOCLOPRAMIDE HCL 5 MG/ML IJ SOLN
5.0000 mg | Freq: Once | INTRAMUSCULAR | Status: AC
  Administered 2016-11-25: 5 mg via INTRAVENOUS
  Filled 2016-11-25: qty 2

## 2016-11-25 MED ORDER — NITROGLYCERIN IN D5W 200-5 MCG/ML-% IV SOLN
2.0000 ug/min | INTRAVENOUS | Status: AC
  Administered 2016-11-26: 5 ug/min via INTRAVENOUS
  Filled 2016-11-25: qty 250

## 2016-11-25 MED ORDER — KENNESTONE BLOOD CARDIOPLEGIA (KBC) MANNITOL SYRINGE (20%, 32ML)
32.0000 mL | INTRAVENOUS | Status: DC
  Filled 2016-11-25: qty 1

## 2016-11-25 MED ORDER — TRANEXAMIC ACID (OHS) BOLUS VIA INFUSION
15.0000 mg/kg | INTRAVENOUS | Status: AC
  Administered 2016-11-26: 1498.5 mg via INTRAVENOUS
  Filled 2016-11-25: qty 1499

## 2016-11-25 MED ORDER — KENNESTONE BLOOD CARDIOPLEGIA VIAL
13.0000 mL | Status: DC
  Filled 2016-11-25: qty 1

## 2016-11-25 MED ORDER — DEXMEDETOMIDINE HCL IN NACL 400 MCG/100ML IV SOLN
0.1000 ug/kg/h | INTRAVENOUS | Status: DC
  Filled 2016-11-25: qty 100

## 2016-11-25 MED ORDER — CHLORHEXIDINE GLUCONATE 4 % EX LIQD
60.0000 mL | Freq: Once | CUTANEOUS | Status: AC
  Administered 2016-11-25: 4 via TOPICAL
  Filled 2016-11-25: qty 60

## 2016-11-25 MED ORDER — POTASSIUM CHLORIDE CRYS ER 20 MEQ PO TBCR
40.0000 meq | EXTENDED_RELEASE_TABLET | Freq: Every day | ORAL | Status: DC
  Administered 2016-11-25: 40 meq via ORAL
  Filled 2016-11-25: qty 2

## 2016-11-25 MED ORDER — SODIUM CHLORIDE 0.9 % IV SOLN
INTRAVENOUS | Status: DC
  Filled 2016-11-25: qty 1

## 2016-11-25 MED ORDER — SODIUM CHLORIDE 0.9 % IV SOLN
INTRAVENOUS | Status: DC
  Filled 2016-11-25: qty 30

## 2016-11-25 MED ORDER — POTASSIUM CHLORIDE 2 MEQ/ML IV SOLN
80.0000 meq | INTRAVENOUS | Status: DC
  Filled 2016-11-25: qty 40

## 2016-11-25 MED ORDER — ALBUTEROL SULFATE (2.5 MG/3ML) 0.083% IN NEBU
2.5000 mg | INHALATION_SOLUTION | Freq: Once | RESPIRATORY_TRACT | Status: AC
  Administered 2016-11-25: 2.5 mg via RESPIRATORY_TRACT

## 2016-11-25 MED ORDER — TRANEXAMIC ACID 1000 MG/10ML IV SOLN
1.5000 mg/kg/h | INTRAVENOUS | Status: AC
  Administered 2016-11-26: 1.5 mg/kg/h via INTRAVENOUS
  Filled 2016-11-25: qty 25

## 2016-11-25 MED ORDER — KENNESTONE BLOOD CARDIOPLEGIA VIAL
13.0000 mL | Freq: Once | Status: DC
  Filled 2016-11-25: qty 1

## 2016-11-25 MED ORDER — TRANEXAMIC ACID (OHS) PUMP PRIME SOLUTION
2.0000 mg/kg | INTRAVENOUS | Status: DC
  Filled 2016-11-25: qty 2

## 2016-11-25 MED ORDER — TEMAZEPAM 15 MG PO CAPS
15.0000 mg | ORAL_CAPSULE | Freq: Once | ORAL | Status: AC | PRN
  Administered 2016-11-26: 15 mg via ORAL
  Filled 2016-11-25: qty 1

## 2016-11-25 MED ORDER — MAGNESIUM SULFATE 50 % IJ SOLN
40.0000 meq | INTRAMUSCULAR | Status: DC
  Filled 2016-11-25 (×2): qty 9.85

## 2016-11-25 MED ORDER — EPINEPHRINE PF 1 MG/ML IJ SOLN
0.0000 ug/min | INTRAVENOUS | Status: DC
  Filled 2016-11-25: qty 4

## 2016-11-25 MED ORDER — BISACODYL 5 MG PO TBEC: 5.0000 mg | DELAYED_RELEASE_TABLET | Freq: Once | ORAL | Status: DC

## 2016-11-25 MED ORDER — VANCOMYCIN HCL 1000 MG IV SOLR
INTRAVENOUS | Status: AC
  Administered 2016-11-26: 1000 mL
  Filled 2016-11-25: qty 1000

## 2016-11-25 NOTE — Progress Notes (Signed)
      [address]       [place] [address]             [phone]     CARDIOTHORACIC SURGERY PROGRESS NOTE  2 Days Post-Op  S/P Procedure(s) (LRB): LEFT HEART CATH AND CORONARY ANGIOGRAPHY (N/A)  Subjective: No complaints.  Some chest pain early this morning, none since.  Some headaches assoc with NTG  Objective: Vital signs in last 24 hours: Temp:  [97.8 F (36.6 C)-99 F (37.2 C)] 98 F (36.7 C) (10/25 1250) Pulse Rate:  [83-90] 83 (10/25 1250) Cardiac Rhythm: Normal sinus rhythm (10/25 0808) Resp:  [14-23] 20 (10/25 0525) BP: (90-116)/(53-76) 116/76 (10/25 1250) SpO2:  [99 %-100 %] 99 % (10/25 1250) Weight:  [220 lb 4.8 oz (99.9 kg)] 220 lb 4.8 oz (99.9 kg) (10/25 0525)  Physical Exam:  Rhythm:   sinus  Breath sounds: clear  Heart sounds:  RRR  Incisions:  n/a  Abdomen:  soft  Extremities:  warm   Intake/Output from previous day: 10/24 0701 - 10/25 0700 In: 1032.5 [P.O.:600; I.V.:432.5] Out: -  Intake/Output this shift: No intake/output data recorded.  Lab Results:  Recent Labs  11/24/16 0524 11/25/16 0424  WBC 10.8* 10.9*  HGB 10.4* 9.9*  HCT 32.0* 31.0*  PLT 290 286   BMET:  Recent Labs  11/23/16 0324 11/24/16 0524  NA 134* 138  K 3.4* 3.2*  CL 106 107  CO2 20* 22  GLUCOSE 190* 101*  BUN 9 7  CREATININE 0.82 0.70  CALCIUM 7.9* 8.4*    CBG (last 3)   Recent Labs  11/24/16 2027 11/25/16 0721 11/25/16 1113  GLUCAP 166* 95 237*   PT/INR:   Recent Labs  11/24/16 0524  LABPROT 14.3  INR 1.12    CXR:  CHEST  2 VIEW  COMPARISON:  08/07/2006  FINDINGS: Cardiomegaly with vascular congestion. Improving perihilar opacities. Mild residual bibasilar opacities, likely atelectasis. No effusions.  IMPRESSION: Improving perihilar opacities, likely resolving edema.  Bibasilar atelectasis.   Electronically Signed   By: Rolm Baptise M.D.   On: 11/22/2016 10:19  Assessment/Plan: S/P Procedure(s)  (LRB): LEFT HEART CATH AND CORONARY ANGIOGRAPHY (N/A)  For CABG in am tomorrow.  I have again reviewed the indications, risks and potential benefits of surgery with [name] [name].  Expectations for her postoperative recovery were discussed.  All questions answered.  I spent in excess of 15 minutes during the conduct of this hospital encounter and >50% of this time involved direct face-to-face encounter with the patient for counseling and/or coordination of their care.   Rexene Alberts, MD 11/25/2016 3:34 PM

## 2016-11-25 NOTE — Progress Notes (Signed)
Pt complaining of 2/10 chest pain and 10/10 headache. BP soft 35O-25P systolic, unable to receive SL nitro - spoke with Mendel Ryder, cards NP - PO metop and imdur held due to BP and severe headache. Orders received to treat headache. O2 applied at 4L - CP resolved. Orders carried out for treatment of headache. Will continue to monitor closely.

## 2016-11-25 NOTE — Progress Notes (Signed)
ANTICOAGULATION CONSULT NOTE - Follow Up Consult  Pharmacy Consult for Heparin Indication: eVCAD  Allergies  Allergen Reactions  . Shellfish Allergy Hives and Swelling    And hives    Patient Measurements: Height: 5' 1"  (154.9 cm) Weight: 220 lb 4.8 oz (99.9 kg) IBW/kg (Calculated) : 47.8 Heparin Dosing Weight:  74.1 kg  Vital Signs: Temp: 98 F (36.7 C) (10/25 1250) Temp Source: Oral (10/25 1250) BP: 116/76 (10/25 1250) Pulse Rate: 83 (10/25 1250)  Labs:  Recent Labs  11/22/16 2206  11/23/16 0324  11/23/16 1131 11/23/16 2350 11/24/16 0524 11/24/16 0902 11/25/16 0424 11/25/16 1113  HGB  --   < > 10.3*  --   --   --  10.4*  --  9.9*  --   HCT  --   --  32.0*  --   --   --  32.0*  --  31.0*  --   PLT  --   --  300  --   --   --  290  --  286  --   APTT  --   --   --   --   --   --  75*  --   --   --   LABPROT  --   --  14.5  --   --   --  14.3  --   --   --   INR  --   --  1.14  --   --   --  1.12  --   --   --   HEPARINUNFRC  --   --   --   < >  --  0.17*  --  0.33  --  0.59  CREATININE  --   --  0.82  --   --   --  0.70  --   --   --   TROPONINI 4.84*  --  5.91*  --  5.59*  --   --   --   --   --   < > = values in this interval not displayed.  Estimated Creatinine Clearance: 95.2 mL/min (by C-G formula based on SCr of 0.7 mg/dL).   Assessment:  Anticoag: NSTEMI> 3VCAD. IV heparin. HL 0.59 in goal. Hlgb 10.4>9.9. Plts 286 stable  Goal of Therapy:  Heparin level 0.3-0.7 units/ml Monitor platelets by anticoagulation protocol: Yes   Plan:  Heparin to 1200 units/hr Tentative CABG 10/26 F/u restart of home meds F/u repeat K+ level  Tighe Gitto S. Alford Highland, PharmD, BCPS Clinical Staff Pharmacist Pager [phone]  Eilene Ghazi Stillinger 11/25/2016,1:36 PM

## 2016-11-25 NOTE — Progress Notes (Signed)
Inpatient Diabetes Program Recommendations  AACE/ADA: New Consensus Statement on Inpatient Glycemic Control (2015)  Target Ranges:  Prepandial:   less than 140 mg/dL      Peak postprandial:   less than 180 mg/dL (1-2 hours)      Critically ill patients:  140 - 180 mg/dL   Lab Results  Component Value Date   GLUCAP 237 (H) 11/25/2016   HGBA1C 9.4 (H) 11/24/2016    Review of Glycemic Control  Post-prandial blood sugars elevated. Needs meal time insulin.  Inpatient Diabetes Program Recommendations:    Add Novolog 4 units tidwc for meal coverage insulin if pt eats > 50% meal. Add HS correction.  Thank you. Lorenda Peck, RD, LDN, CDE Inpatient Diabetes Coordinator [phone]

## 2016-11-25 NOTE — Progress Notes (Signed)
Progress Note  Patient Name: [name] [name] Date of Encounter: 11/25/2016  Primary Cardiologist: Ellyn Hack  Subjective   Had a headache this morning, and some chest pain. Improved with medications.   Inpatient Medications    Scheduled Meds: . aspirin EC  81 mg Oral Daily  . atorvastatin  80 mg Oral q1800  . insulin aspart  0-15 Units Subcutaneous TID WC  . insulin glargine  45 Units Subcutaneous QHS  . isosorbide mononitrate  30 mg Oral Daily  . levothyroxine  100 mcg Oral QAC breakfast  . metoprolol tartrate  25 mg Oral BID  . pantoprazole  40 mg Oral Daily  . sodium chloride flush  3 mL Intravenous Q12H   Continuous Infusions: . sodium chloride 250 mL (11/23/16 1210)  . heparin 1,200 Units/hr (11/24/16 1634)   PRN Meds: sodium chloride, acetaminophen, ondansetron (ZOFRAN) IV, sodium chloride flush, traMADol   Vital Signs    Vitals:   11/25/16 0033 11/25/16 0525 11/25/16 0723 11/25/16 0829  BP: (!) 95/53 92/61 (!) 90/58   Pulse: 90 83    Resp: 14 20    Temp: 99 F (37.2 C) 98.4 F (36.9 C)  98.4 F (36.9 C)  TempSrc: Oral Oral  Oral  SpO2: 100% 100%  100%  Weight:  220 lb 4.8 oz (99.9 kg)    Height:        Intake/Output Summary (Last 24 hours) at 11/25/16 1231 Last data filed at 11/24/16 1800  Gross per 24 hour  Intake           672.51 ml  Output                0 ml  Net           672.51 ml   Filed Weights   11/23/16 1140 11/24/16 0400 11/25/16 0525  Weight: 237 lb 1.6 oz (107.5 kg) 217 lb 12.8 oz (98.8 kg) 220 lb 4.8 oz (99.9 kg)    Telemetry    SR - Personally Reviewed  Physical Exam   General: Well developed, well nourished, female appearing in no acute distress. Head: Normocephalic, atraumatic.  Neck: Supple without bruits, JVD. Lungs:  Resp regular and unlabored, CTA. Heart: RRR, S1, S2, no S3, S4, or murmur; no rub. Abdomen: Soft, non-tender, non-distended with normoactive bowel sounds. No hepatomegaly. No rebound/guarding. No obvious  abdominal masses. Extremities: No clubbing, cyanosis, edema. Distal pedal pulses are 2+ bilaterally. Neuro: Alert and oriented X 3. Moves all extremities spontaneously. Psych: Normal affect.  Labs    Chemistry Recent Labs Lab 11/22/16 1127 11/23/16 0324 11/24/16 0524  NA 134* 134* 138  K 4.1 3.4* 3.2*  CL 107 106 107  CO2 17* 20* 22  GLUCOSE 245* 190* 101*  BUN 10 9 7   CREATININE 0.75 0.82 0.70  CALCIUM 8.7* 7.9* 8.4*  PROT 7.1  --  6.5  ALBUMIN 3.8  --  3.1*  AST 28  --  56*  ALT 21  --  23  ALKPHOS 110  --  99  BILITOT 0.5  --  0.7  GFRNONAA >60 >60 >60  GFRAA >60 >60 >60  ANIONGAP 10 8 9      Hematology Recent Labs Lab 11/23/16 0324 11/24/16 0524 11/25/16 0424  WBC 10.6* 10.8* 10.9*  RBC 3.54* 3.54* 3.40*  HGB 10.3* 10.4* 9.9*  HCT 32.0* 32.0* 31.0*  MCV 90.4 90.4 91.2  MCH 29.1 29.4 29.1  MCHC 32.2 32.5 31.9  RDW 14.3 14.7 14.8  PLT  300 290 286    Cardiac Enzymes Recent Labs Lab 11/22/16 2206 11/23/16 0324 11/23/16 1131  TROPONINI 4.84* 5.91* 5.59*    Recent Labs Lab 11/22/16 0950 11/22/16 1346  TROPIPOC 0.02 0.55*     BNPNo results for input(s): BNP, PROBNP in the last 168 hours.   DDimer No results for input(s): DDIMER in the last 168 hours.    Radiology    No results found.  Cardiac Studies   Cath: 11/26/16  Conclusion   1. Diffusely diseased RCA with total occlusion of the distal RCA. This is the patient's culprit vessel for non-STEMI with contrast staining at the occlusion. There are faint left to right collaterals present. 2. Severe proximal LAD stenosis, diffuse segment 3. Severe proximal left circumflex stenosis 4. Mild segmental LV dysfunction with akinesis of the mid inferior wall and normal wall motion through the anterolateral and periapical walls.  Recommendation: We will review revascularization options. This is a diabetic patient with diffuse proximal coronary disease. At her young age, it would be reasonable to  consider multivessel PCI. However, I am reluctant about obligating her to dual antiplatelet therapy for the next 12 months in the setting of ulcerative colitis. Will place a formal cardiac surgical consult for CABG and further discussion of best revascularization strategy. Will resume IV heparin after her TR band is out. Will hold on oral antiplatelet therapy other than aspirin in case she is ultimately treated with CABG.   Patient Profile     46 y.o. female PMH of of DM-2 on Insuling, Ulcerative Colitis, Graves Dz (with hypothyroidism), ? Vasculitis & endometriosis who presented on 10/22 with stuttering SSx of ACS/NSTEMI. Cardiac cath on 10/23 revealed severe MV CAD -- best option is CABG  Assessment & Plan    1. NSTEMI: Presented with chest pain. Trop peaked at 5.91. Cath noted above. TCTS consulted and planned for surgery tomorrow. Did have some chest pain this morning. Now resolved.  -- remains on IV heparin, BB, ASA, statin  2. HTN: Blood pressures soft this morning. BB held as a result, improved. Held parameters placed.    3. HL: statin increased from 38m to 829mdaily  4. IDDM: CBGs variable this admission. Seen by Diabetes Coordinator.   5. Migraines: Bad headache this morning. Given meds, now improved. Will stop Imdur.   6. Hypokalemia: add K+ today -- BMET in the am   Signed, LiReino BellisNP  11/25/2016, 12:31 PM  Pager # [phone] I saw and evaluated the patient is at noon along with LiReino BellisNP. We reviewed the chart together and discussed her case. I agree with her findings, examination, and recommendations as noted above as per our discussion.  Her blood pressure looks stable. Hold parameters for medications. Headache is improved off of Imdur. No longer having any anginal symptoms.  Where currently tentatively scheduled for CABG tomorrow. She is supposed be having her preop tests done today.  Stable glycemic control. Restarted her Synthroid - TSH is  quite high, but free T4 and T3 are just mildly reduced. She could probably tolerate increased dose of Synthroid, however we will hold off on doing this until postop.  She seems to be handling the concept of going to bypass surgery relatively well. Is on stable regimen at this time.   For questions or updates, please contact [place] consult [email or web address] for contact info under Cardiology/STEMI. Daytime calls, contact the Day Call APP (6a-8a) or assigned team (Teams A-D) provider (7:30a - 5p).  All other daytime calls (7:30-5p), contact the Card Master @ [phone].   Nighttime calls, contact the assigned APP (5p-8p) or MD (6:30p-8p). Overnight calls (8p-6a), contact the on call Fellow @ [phone].

## 2016-11-25 NOTE — Progress Notes (Signed)
Pre-CABG testing has been completed. Preliminary results can be found in chart review -> CV Proc  11/25/16 5:22 PM [name] [name] RVT

## 2016-11-26 ENCOUNTER — Encounter (HOSPITAL_COMMUNITY): Payer: Self-pay | Admitting: Anesthesiology

## 2016-11-26 ENCOUNTER — Inpatient Hospital Stay (HOSPITAL_COMMUNITY): Payer: PRIVATE HEALTH INSURANCE

## 2016-11-26 ENCOUNTER — Inpatient Hospital Stay (HOSPITAL_COMMUNITY): Payer: PRIVATE HEALTH INSURANCE | Admitting: Anesthesiology

## 2016-11-26 ENCOUNTER — Inpatient Hospital Stay (HOSPITAL_COMMUNITY)
Admission: EM | Disposition: A | Discharge status: Home / Self Care | Payer: Self-pay | Source: Home / Self Care | Attending: Thoracic Surgery (Cardiothoracic Vascular Surgery)

## 2016-11-26 DIAGNOSIS — Z951 Presence of aortocoronary bypass graft: Secondary | ICD-10-CM

## 2016-11-26 DIAGNOSIS — I25110 Atherosclerotic heart disease of native coronary artery with unstable angina pectoris: Secondary | ICD-10-CM

## 2016-11-26 HISTORY — DX: Presence of aortocoronary bypass graft: Z95.1

## 2016-11-26 HISTORY — PX: TEE WITHOUT CARDIOVERSION: SHX5443

## 2016-11-26 HISTORY — PX: CORONARY ARTERY BYPASS GRAFT: SHX141

## 2016-11-26 LAB — POCT I-STAT, CHEM 8
BUN: 11 mg/dL (ref 6–20)
BUN: 8 mg/dL (ref 6–20)
BUN: 9 mg/dL (ref 6–20)
BUN: 9 mg/dL (ref 6–20)
BUN: 9 mg/dL (ref 6–20)
BUN: 7 mg/dL (ref 6–20)
CALCIUM ION: 1.25 mmol/L (ref 1.15–1.40)
CHLORIDE: 106 mmol/L (ref 101–111)
CHLORIDE: 102 mmol/L (ref 101–111)
CHLORIDE: 108 mmol/L (ref 101–111)
CREATININE: 0.4 mg/dL — AB (ref 0.44–1.00)
CREATININE: 0.6 mg/dL (ref 0.44–1.00)
Calcium, Ion: 0.97 mmol/L — ABNORMAL LOW (ref 1.15–1.40)
Calcium, Ion: 1.24 mmol/L (ref 1.15–1.40)
Calcium, Ion: 1.14 mmol/L — ABNORMAL LOW (ref 1.15–1.40)
Calcium, Ion: 1.13 mmol/L — ABNORMAL LOW (ref 1.15–1.40)
Calcium, Ion: 1.12 mmol/L — ABNORMAL LOW (ref 1.15–1.40)
Chloride: 105 mmol/L (ref 101–111)
Chloride: 105 mmol/L (ref 101–111)
Chloride: 112 mmol/L — ABNORMAL HIGH (ref 101–111)
Creatinine, Ser: 0.5 mg/dL (ref 0.44–1.00)
Creatinine, Ser: 0.5 mg/dL (ref 0.44–1.00)
Creatinine, Ser: 0.5 mg/dL (ref 0.44–1.00)
Creatinine, Ser: 0.6 mg/dL (ref 0.44–1.00)
GLUCOSE: 143 mg/dL — AB (ref 65–99)
Glucose, Bld: 211 mg/dL — ABNORMAL HIGH (ref 65–99)
Glucose, Bld: 165 mg/dL — ABNORMAL HIGH (ref 65–99)
Glucose, Bld: 161 mg/dL — ABNORMAL HIGH (ref 65–99)
Glucose, Bld: 183 mg/dL — ABNORMAL HIGH (ref 65–99)
Glucose, Bld: 113 mg/dL — ABNORMAL HIGH (ref 65–99)
HCT: 24 % — ABNORMAL LOW (ref 36.0–46.0)
HCT: 23 % — ABNORMAL LOW (ref 36.0–46.0)
HEMATOCRIT: 31 % — AB (ref 36.0–46.0)
HEMATOCRIT: 23 % — AB (ref 36.0–46.0)
HEMATOCRIT: 28 % — AB (ref 36.0–46.0)
HEMATOCRIT: 27 % — AB (ref 36.0–46.0)
HEMOGLOBIN: 9.2 g/dL — AB (ref 12.0–15.0)
Hemoglobin: 10.5 g/dL — ABNORMAL LOW (ref 12.0–15.0)
Hemoglobin: 7.8 g/dL — ABNORMAL LOW (ref 12.0–15.0)
Hemoglobin: 9.5 g/dL — ABNORMAL LOW (ref 12.0–15.0)
Hemoglobin: 8.2 g/dL — ABNORMAL LOW (ref 12.0–15.0)
Hemoglobin: 7.8 g/dL — ABNORMAL LOW (ref 12.0–15.0)
POTASSIUM: 3.8 mmol/L (ref 3.5–5.1)
POTASSIUM: 3.8 mmol/L (ref 3.5–5.1)
POTASSIUM: 4 mmol/L (ref 3.5–5.1)
Potassium: 4.6 mmol/L (ref 3.5–5.1)
Potassium: 4 mmol/L (ref 3.5–5.1)
Potassium: 4.6 mmol/L (ref 3.5–5.1)
SODIUM: 138 mmol/L (ref 135–145)
SODIUM: 140 mmol/L (ref 135–145)
SODIUM: 144 mmol/L (ref 135–145)
Sodium: 143 mmol/L (ref 135–145)
Sodium: 138 mmol/L (ref 135–145)
Sodium: 138 mmol/L (ref 135–145)
TCO2: 25 mmol/L (ref 22–32)
TCO2: 21 mmol/L — ABNORMAL LOW (ref 22–32)
TCO2: 30 mmol/L (ref 22–32)
TCO2: 27 mmol/L (ref 22–32)
TCO2: 25 mmol/L (ref 22–32)
TCO2: 22 mmol/L (ref 22–32)

## 2016-11-26 LAB — BASIC METABOLIC PANEL
Anion gap: 10 (ref 5–15)
BUN: 12 mg/dL (ref 6–20)
CHLORIDE: 104 mmol/L (ref 101–111)
CO2: 21 mmol/L — ABNORMAL LOW (ref 22–32)
Calcium: 9.3 mg/dL (ref 8.9–10.3)
Creatinine, Ser: 0.81 mg/dL (ref 0.44–1.00)
GFR calc Af Amer: >60 mL/min (ref >60)
GFR calc non Af Amer: >60 mL/min (ref >60)
Glucose, Bld: 236 mg/dL — ABNORMAL HIGH (ref 65–99)
POTASSIUM: 3.9 mmol/L (ref 3.5–5.1)
SODIUM: 135 mmol/L (ref 135–145)

## 2016-11-26 LAB — POCT I-STAT 4, (NA,K, GLUC, HGB,HCT)
Glucose, Bld: 149 mg/dL — ABNORMAL HIGH (ref 65–99)
HEMATOCRIT: 26 % — AB (ref 36.0–46.0)
Hemoglobin: 8.8 g/dL — ABNORMAL LOW (ref 12.0–15.0)
POTASSIUM: 3.6 mmol/L (ref 3.5–5.1)
SODIUM: 144 mmol/L (ref 135–145)

## 2016-11-26 LAB — PREPARE RBC (CROSSMATCH)

## 2016-11-26 LAB — POCT I-STAT 3, ART BLOOD GAS (G3+)
ACID-BASE DEFICIT: 3 mmol/L — AB (ref 0.0–2.0)
ACID-BASE EXCESS: 3 mmol/L — AB (ref 0.0–2.0)
Acid-Base Excess: 2 mmol/L (ref 0.0–2.0)
BICARBONATE: 26.2 mmol/L (ref 20.0–28.0)
Bicarbonate: 24.5 mmol/L (ref 20.0–28.0)
Bicarbonate: 22.5 mmol/L (ref 20.0–28.0)
O2 SAT: 99 %
O2 Saturation: 100 %
O2 Saturation: 100 %
PO2 ART: 456 mmHg — AB (ref 83.0–108.0)
PO2 ART: 137 mmHg — AB (ref 83.0–108.0)
TCO2: 27 mmol/L (ref 22–32)
TCO2: 25 mmol/L (ref 22–32)
TCO2: 24 mmol/L (ref 22–32)
pCO2 arterial: 31.5 mmHg — ABNORMAL LOW (ref 32.0–48.0)
pCO2 arterial: 27.4 mmHg — ABNORMAL LOW (ref 32.0–48.0)
pCO2 arterial: 36.8 mmHg (ref 32.0–48.0)
pH, Arterial: 7.528 — ABNORMAL HIGH (ref 7.350–7.450)
pH, Arterial: 7.56 — ABNORMAL HIGH (ref 7.350–7.450)
pH, Arterial: 7.384 (ref 7.350–7.450)
pO2, Arterial: 266 mmHg — ABNORMAL HIGH (ref 83.0–108.0)

## 2016-11-26 LAB — COOXEMETRY PANEL
Carboxyhemoglobin: 1.4 % (ref 0.5–1.5)
Methemoglobin: 1 % (ref 0.0–1.5)
O2 Saturation: 60.3 %
Total hemoglobin: 9.4 g/dL — ABNORMAL LOW (ref 12.0–16.0)

## 2016-11-26 LAB — PROTIME-INR
INR: 1.45
PROTHROMBIN TIME: 17.5 s — AB (ref 11.4–15.2)

## 2016-11-26 LAB — CBC
HCT: 32.5 % — ABNORMAL LOW (ref 36.0–46.0)
HCT: 24.4 % — ABNORMAL LOW (ref 36.0–46.0)
HCT: 29.1 % — ABNORMAL LOW (ref 36.0–46.0)
HEMOGLOBIN: 10.4 g/dL — AB (ref 12.0–15.0)
HEMOGLOBIN: 8 g/dL — AB (ref 12.0–15.0)
Hemoglobin: 9.5 g/dL — ABNORMAL LOW (ref 12.0–15.0)
MCH: 28.8 pg (ref 26.0–34.0)
MCH: 29.3 pg (ref 26.0–34.0)
MCH: 29.1 pg (ref 26.0–34.0)
MCHC: 32 g/dL (ref 30.0–36.0)
MCHC: 32.8 g/dL (ref 30.0–36.0)
MCHC: 32.6 g/dL (ref 30.0–36.0)
MCV: 90 fL (ref 78.0–100.0)
MCV: 89.4 fL (ref 78.0–100.0)
MCV: 89 fL (ref 78.0–100.0)
Platelets: 331 10*3/uL (ref 150–400)
Platelets: 174 10*3/uL (ref 150–400)
Platelets: 153 10*3/uL (ref 150–400)
RBC: 3.61 MIL/uL — ABNORMAL LOW (ref 3.87–5.11)
RBC: 2.73 MIL/uL — AB (ref 3.87–5.11)
RBC: 3.27 MIL/uL — ABNORMAL LOW (ref 3.87–5.11)
RDW: 14.6 % (ref 11.5–15.5)
RDW: 14.3 % (ref 11.5–15.5)
RDW: 14.4 % (ref 11.5–15.5)
WBC: 15.3 10*3/uL — ABNORMAL HIGH (ref 4.0–10.5)
WBC: 15 10*3/uL — AB (ref 4.0–10.5)
WBC: 10.8 10*3/uL — AB (ref 4.0–10.5)

## 2016-11-26 LAB — HEMOGLOBIN AND HEMATOCRIT, BLOOD
HCT: 21.7 % — ABNORMAL LOW (ref 36.0–46.0)
Hemoglobin: 7.3 g/dL — ABNORMAL LOW (ref 12.0–15.0)

## 2016-11-26 LAB — GLUCOSE, CAPILLARY
GLUCOSE-CAPILLARY: 216 mg/dL — AB (ref 65–99)
GLUCOSE-CAPILLARY: 120 mg/dL — AB (ref 65–99)
GLUCOSE-CAPILLARY: 111 mg/dL — AB (ref 65–99)
GLUCOSE-CAPILLARY: 115 mg/dL — AB (ref 65–99)
GLUCOSE-CAPILLARY: 104 mg/dL — AB (ref 65–99)
GLUCOSE-CAPILLARY: 162 mg/dL — AB (ref 65–99)
Glucose-Capillary: 92 mg/dL (ref 65–99)
Glucose-Capillary: 147 mg/dL — ABNORMAL HIGH (ref 65–99)
Glucose-Capillary: 109 mg/dL — ABNORMAL HIGH (ref 65–99)
Glucose-Capillary: 112 mg/dL — ABNORMAL HIGH (ref 65–99)
Glucose-Capillary: 124 mg/dL — ABNORMAL HIGH (ref 65–99)

## 2016-11-26 LAB — APTT: APTT: 32 s (ref 24–36)

## 2016-11-26 LAB — PLATELET COUNT: Platelets: 211 10*3/uL (ref 150–400)

## 2016-11-26 LAB — CREATININE, SERUM
Creatinine, Ser: 0.65 mg/dL (ref 0.44–1.00)
GFR calc Af Amer: >60 mL/min (ref >60)
GFR calc non Af Amer: >60 mL/min (ref >60)

## 2016-11-26 LAB — MAGNESIUM: Magnesium: 3.3 mg/dL — ABNORMAL HIGH (ref 1.7–2.4)

## 2016-11-26 SURGERY — CORONARY ARTERY BYPASS GRAFTING (CABG)
Anesthesia: General | Site: Chest

## 2016-11-26 MED ORDER — MILRINONE LACTATE IN DEXTROSE 20-5 MG/100ML-% IV SOLN
0.3000 ug/kg/min | INTRAVENOUS | Status: AC
  Administered 2016-11-26: 0.3 ug/kg/min via INTRAVENOUS
  Administered 2016-11-27 (×2): 0.4 ug/kg/min via INTRAVENOUS
  Filled 2016-11-26 (×2): qty 100

## 2016-11-26 MED ORDER — TRAMADOL HCL 50 MG PO TABS
50.0000 mg | ORAL_TABLET | ORAL | Status: DC | PRN
  Administered 2016-11-27 – 2016-12-02 (×4): 100 mg via ORAL
  Filled 2016-11-26 (×4): qty 2

## 2016-11-26 MED ORDER — VANCOMYCIN HCL IN DEXTROSE 1-5 GM/200ML-% IV SOLN
1000.0000 mg | Freq: Once | INTRAVENOUS | Status: AC
  Administered 2016-11-26: 1000 mg via INTRAVENOUS
  Filled 2016-11-26: qty 200

## 2016-11-26 MED ORDER — ORAL CARE MOUTH RINSE: 15.0000 mL | OROMUCOSAL | Status: DC

## 2016-11-26 MED ORDER — ALBUMIN HUMAN 5 % IV SOLN
INTRAVENOUS | Status: DC | PRN
  Administered 2016-11-26 (×2): via INTRAVENOUS

## 2016-11-26 MED ORDER — SODIUM CHLORIDE 0.9 % IJ SOLN
OROMUCOSAL | Status: DC | PRN
  Administered 2016-11-26 (×3): 4 mL via TOPICAL

## 2016-11-26 MED ORDER — ACETAMINOPHEN 160 MG/5ML PO SOLN: 650.0000 mg | Freq: Once | ORAL | Status: AC

## 2016-11-26 MED ORDER — LEVOTHYROXINE SODIUM 100 MCG PO TABS
100.0000 ug | ORAL_TABLET | Freq: Every day | ORAL | Status: DC
  Administered 2016-11-27 – 2016-12-02 (×6): 100 ug via ORAL
  Filled 2016-11-26 (×6): qty 1

## 2016-11-26 MED ORDER — HEPARIN SODIUM (PORCINE) 1000 UNIT/ML IJ SOLN
INTRAMUSCULAR | Status: DC | PRN
  Administered 2016-11-26: 26000 [IU] via INTRAVENOUS
  Administered 2016-11-26: 3000 [IU] via INTRAVENOUS

## 2016-11-26 MED ORDER — 0.9 % SODIUM CHLORIDE (POUR BTL) OPTIME
TOPICAL | Status: DC | PRN
  Administered 2016-11-26: 5000 mL

## 2016-11-26 MED ORDER — LACTATED RINGERS IV SOLN: INTRAVENOUS | Status: DC

## 2016-11-26 MED ORDER — SODIUM CHLORIDE 0.9% FLUSH
3.0000 mL | Freq: Two times a day (BID) | INTRAVENOUS | Status: DC
  Administered 2016-11-27 – 2016-12-01 (×6): 3 mL via INTRAVENOUS

## 2016-11-26 MED ORDER — LACTATED RINGERS IV SOLN
500.0000 mL | Freq: Once | INTRAVENOUS | Status: AC | PRN
  Administered 2016-11-26: 14:00:00 via INTRAVENOUS

## 2016-11-26 MED ORDER — ACETAMINOPHEN 650 MG RE SUPP
650.0000 mg | Freq: Once | RECTAL | Status: AC
  Administered 2016-11-26: 650 mg via RECTAL

## 2016-11-26 MED ORDER — PHENYLEPHRINE 40 MCG/ML (10ML) SYRINGE FOR IV PUSH (FOR BLOOD PRESSURE SUPPORT)
PREFILLED_SYRINGE | INTRAVENOUS | Status: AC
  Filled 2016-11-26: qty 20

## 2016-11-26 MED ORDER — CHLORHEXIDINE GLUCONATE 0.12 % MT SOLN
15.0000 mL | OROMUCOSAL | Status: AC
  Administered 2016-11-26: 15 mL via OROMUCOSAL

## 2016-11-26 MED ORDER — DEXMEDETOMIDINE HCL 200 MCG/2ML IV SOLN
0.0000 ug/kg/h | INTRAVENOUS | Status: DC
  Filled 2016-11-26: qty 2

## 2016-11-26 MED ORDER — FENTANYL CITRATE (PF) 250 MCG/5ML IJ SOLN
INTRAMUSCULAR | Status: AC
  Filled 2016-11-26: qty 20

## 2016-11-26 MED ORDER — VANCOMYCIN HCL 1000 MG IV SOLR
INTRAVENOUS | Status: DC | PRN
  Administered 2016-11-26: 1500 mg via INTRAVENOUS

## 2016-11-26 MED ORDER — SODIUM CHLORIDE 0.9 % IV SOLN
INTRAVENOUS | Status: DC
  Administered 2016-11-26 (×2): via INTRAVENOUS

## 2016-11-26 MED ORDER — POTASSIUM CHLORIDE 10 MEQ/50ML IV SOLN
10.0000 meq | INTRAVENOUS | Status: AC
  Administered 2016-11-26 (×3): 10 meq via INTRAVENOUS

## 2016-11-26 MED ORDER — DEXMEDETOMIDINE HCL IN NACL 200 MCG/50ML IV SOLN
INTRAVENOUS | Status: DC | PRN
Start: 2016-11-26 — End: 2016-11-26
  Administered 2016-11-26: .3 ug/kg/h via INTRAVENOUS

## 2016-11-26 MED ORDER — LIDOCAINE HCL (CARDIAC) 20 MG/ML IV SOLN
INTRAVENOUS | Status: DC | PRN
Start: 2016-11-26 — End: 2016-11-26
  Administered 2016-11-26: 80 mg via INTRAVENOUS

## 2016-11-26 MED ORDER — PANTOPRAZOLE SODIUM 40 MG PO TBEC
40.0000 mg | DELAYED_RELEASE_TABLET | Freq: Every day | ORAL | Status: DC
  Administered 2016-11-28: 40 mg via ORAL
  Filled 2016-11-26: qty 1

## 2016-11-26 MED ORDER — ASPIRIN 81 MG PO CHEW: 324.0000 mg | CHEWABLE_TABLET | Freq: Every day | ORAL | Status: DC

## 2016-11-26 MED ORDER — PROTAMINE SULFATE 10 MG/ML IV SOLN
INTRAVENOUS | Status: DC | PRN
  Administered 2016-11-26: 230 mg via INTRAVENOUS
  Administered 2016-11-26: 20 mg via INTRAVENOUS

## 2016-11-26 MED ORDER — FAMOTIDINE IN NACL 20-0.9 MG/50ML-% IV SOLN
20.0000 mg | Freq: Two times a day (BID) | INTRAVENOUS | Status: AC
  Administered 2016-11-26 (×2): 20 mg via INTRAVENOUS
  Filled 2016-11-26: qty 50

## 2016-11-26 MED ORDER — PROPOFOL 10 MG/ML IV BOLUS
INTRAVENOUS | Status: AC
  Filled 2016-11-26: qty 40

## 2016-11-26 MED ORDER — ACETAMINOPHEN 160 MG/5ML PO SOLN: 1000.0000 mg | Freq: Four times a day (QID) | ORAL | Status: DC

## 2016-11-26 MED ORDER — BISACODYL 10 MG RE SUPP: 10.0000 mg | Freq: Every day | RECTAL | Status: DC

## 2016-11-26 MED ORDER — ONDANSETRON HCL 4 MG/2ML IJ SOLN
4.0000 mg | Freq: Four times a day (QID) | INTRAMUSCULAR | Status: DC | PRN
Start: 2016-11-26 — End: 2016-12-02
  Administered 2016-11-27 – 2016-11-28 (×2): 4 mg via INTRAVENOUS
  Filled 2016-11-26 (×2): qty 2

## 2016-11-26 MED ORDER — LACTATED RINGERS IV SOLN
INTRAVENOUS | Status: DC | PRN
  Administered 2016-11-26: 07:00:00 via INTRAVENOUS

## 2016-11-26 MED ORDER — METOPROLOL TARTRATE 25 MG/10 ML ORAL SUSPENSION: 12.5000 mg | Freq: Two times a day (BID) | ORAL | Status: DC

## 2016-11-26 MED ORDER — ALBUMIN HUMAN 5 % IV SOLN
250.0000 mL | INTRAVENOUS | Status: AC | PRN
  Administered 2016-11-26 (×4): 250 mL via INTRAVENOUS
  Filled 2016-11-26 (×2): qty 250

## 2016-11-26 MED ORDER — SODIUM CHLORIDE 0.9% FLUSH: 3.0000 mL | INTRAVENOUS | Status: DC | PRN

## 2016-11-26 MED ORDER — MIDAZOLAM HCL 2 MG/2ML IJ SOLN
2.0000 mg | INTRAMUSCULAR | Status: DC | PRN
  Filled 2016-11-26: qty 2

## 2016-11-26 MED ORDER — DOCUSATE SODIUM 100 MG PO CAPS
200.0000 mg | ORAL_CAPSULE | Freq: Every day | ORAL | Status: DC
  Administered 2016-11-27 – 2016-11-29 (×3): 200 mg via ORAL
  Filled 2016-11-26 (×5): qty 2

## 2016-11-26 MED ORDER — METOPROLOL TARTRATE 5 MG/5ML IV SOLN
2.5000 mg | INTRAVENOUS | Status: DC | PRN
  Administered 2016-11-26: 2.5 mg via INTRAVENOUS

## 2016-11-26 MED ORDER — MIDAZOLAM HCL 10 MG/2ML IJ SOLN
INTRAMUSCULAR | Status: AC
  Filled 2016-11-26: qty 2

## 2016-11-26 MED ORDER — FENTANYL CITRATE (PF) 250 MCG/5ML IJ SOLN
INTRAMUSCULAR | Status: AC
  Filled 2016-11-26: qty 5

## 2016-11-26 MED ORDER — SODIUM CHLORIDE 0.9 % IV SOLN: INTRAVENOUS | Status: DC

## 2016-11-26 MED ORDER — METOPROLOL TARTRATE 12.5 MG HALF TABLET
12.5000 mg | ORAL_TABLET | Freq: Two times a day (BID) | ORAL | Status: DC
  Administered 2016-11-27 – 2016-11-28 (×2): 12.5 mg via ORAL
  Filled 2016-11-26 (×2): qty 1

## 2016-11-26 MED ORDER — ONDANSETRON HCL 4 MG/2ML IJ SOLN
INTRAMUSCULAR | Status: AC
  Filled 2016-11-26: qty 2

## 2016-11-26 MED ORDER — ASPIRIN EC 325 MG PO TBEC
325.0000 mg | DELAYED_RELEASE_TABLET | Freq: Every day | ORAL | Status: DC
  Administered 2016-11-27 – 2016-11-28 (×2): 325 mg via ORAL
  Filled 2016-11-26 (×2): qty 1

## 2016-11-26 MED ORDER — MAGNESIUM SULFATE 4 GM/100ML IV SOLN
4.0000 g | Freq: Once | INTRAVENOUS | Status: AC
  Administered 2016-11-26: 4 g via INTRAVENOUS
  Filled 2016-11-26: qty 100

## 2016-11-26 MED ORDER — CHLORHEXIDINE GLUCONATE 0.12% ORAL RINSE (MEDLINE KIT)
15.0000 mL | Freq: Two times a day (BID) | OROMUCOSAL | Status: DC
  Administered 2016-11-26: 15 mL via OROMUCOSAL

## 2016-11-26 MED ORDER — INSULIN REGULAR BOLUS VIA INFUSION
0.0000 [IU] | Freq: Three times a day (TID) | INTRAVENOUS | Status: DC
  Filled 2016-11-26: qty 10

## 2016-11-26 MED ORDER — SODIUM CHLORIDE 0.45 % IV SOLN
INTRAVENOUS | Status: DC | PRN
  Administered 2016-11-26: 14:00:00 via INTRAVENOUS

## 2016-11-26 MED ORDER — SODIUM CHLORIDE 0.9 % IV SOLN
INTRAVENOUS | Status: AC
  Administered 2016-11-26: 14:00:00 via INTRAVENOUS

## 2016-11-26 MED ORDER — SODIUM CHLORIDE 0.9 % IV SOLN: 250.0000 mL | INTRAVENOUS | Status: DC

## 2016-11-26 MED ORDER — ATORVASTATIN CALCIUM 80 MG PO TABS
80.0000 mg | ORAL_TABLET | Freq: Every day | ORAL | Status: DC
  Administered 2016-11-27 – 2016-11-30 (×3): 80 mg via ORAL
  Filled 2016-11-26 (×4): qty 1

## 2016-11-26 MED ORDER — MORPHINE SULFATE (PF) 2 MG/ML IV SOLN: 1.0000 mg | INTRAVENOUS | Status: DC | PRN

## 2016-11-26 MED ORDER — SODIUM CHLORIDE 0.9 % IV SOLN
0.0000 ug/min | INTRAVENOUS | Status: DC
  Administered 2016-11-26: 10 ug/min via INTRAVENOUS
  Administered 2016-11-27: 40 ug/min via INTRAVENOUS
  Filled 2016-11-26: qty 2

## 2016-11-26 MED ORDER — SODIUM CHLORIDE 0.9 % IJ SOLN
INTRAMUSCULAR | Status: AC
  Filled 2016-11-26: qty 10

## 2016-11-26 MED ORDER — ALBUMIN HUMAN 5 % IV SOLN
12.5000 g | Freq: Once | INTRAVENOUS | Status: AC
  Administered 2016-11-26: 12.5 g via INTRAVENOUS

## 2016-11-26 MED ORDER — OXYCODONE HCL 5 MG PO TABS
5.0000 mg | ORAL_TABLET | ORAL | Status: DC | PRN
  Administered 2016-11-27 (×3): 10 mg via ORAL
  Administered 2016-12-01: 5 mg via ORAL
  Filled 2016-11-26 (×4): qty 2

## 2016-11-26 MED ORDER — LACTATED RINGERS IV SOLN
INTRAVENOUS | Status: DC
  Administered 2016-11-26 – 2016-11-28 (×3): via INTRAVENOUS

## 2016-11-26 MED ORDER — NITROGLYCERIN IN D5W 200-5 MCG/ML-% IV SOLN: 0.0000 ug/min | INTRAVENOUS | Status: DC

## 2016-11-26 MED ORDER — FENTANYL CITRATE (PF) 250 MCG/5ML IJ SOLN
INTRAMUSCULAR | Status: AC
  Filled 2016-11-26: qty 10

## 2016-11-26 MED ORDER — SODIUM CHLORIDE 0.9 % IV SOLN
INTRAVENOUS | Status: DC | PRN
  Administered 2016-11-26: 1 [IU]/h via INTRAVENOUS

## 2016-11-26 MED ORDER — ACETAMINOPHEN 500 MG PO TABS
1000.0000 mg | ORAL_TABLET | Freq: Four times a day (QID) | ORAL | Status: AC
  Administered 2016-11-27 – 2016-11-30 (×8): 1000 mg via ORAL
  Filled 2016-11-26 (×12): qty 2

## 2016-11-26 MED ORDER — DEXTROSE 5 % IV SOLN
1.5000 g | Freq: Two times a day (BID) | INTRAVENOUS | Status: AC
  Administered 2016-11-26 – 2016-11-28 (×4): 1.5 g via INTRAVENOUS
  Filled 2016-11-26 (×4): qty 1.5

## 2016-11-26 MED ORDER — ROCURONIUM BROMIDE 10 MG/ML (PF) SYRINGE
PREFILLED_SYRINGE | INTRAVENOUS | Status: AC
  Filled 2016-11-26: qty 5

## 2016-11-26 MED ORDER — LIDOCAINE 2% (20 MG/ML) 5 ML SYRINGE
INTRAMUSCULAR | Status: AC
  Filled 2016-11-26: qty 5

## 2016-11-26 MED ORDER — SODIUM CHLORIDE 0.9 % IV SOLN
Freq: Once | INTRAVENOUS | Status: AC
  Administered 2016-11-26: 14:00:00 via INTRAVENOUS

## 2016-11-26 MED ORDER — PROPOFOL 10 MG/ML IV BOLUS
INTRAVENOUS | Status: DC | PRN
  Administered 2016-11-26: 20 mg via INTRAVENOUS
  Administered 2016-11-26: 100 mg via INTRAVENOUS

## 2016-11-26 MED ORDER — ROCURONIUM BROMIDE 100 MG/10ML IV SOLN
INTRAVENOUS | Status: DC | PRN
  Administered 2016-11-26 (×3): 50 mg via INTRAVENOUS

## 2016-11-26 MED ORDER — MORPHINE SULFATE (PF) 4 MG/ML IV SOLN
1.0000 mg | INTRAVENOUS | Status: DC | PRN
  Administered 2016-11-27 – 2016-11-28 (×7): 2 mg via INTRAVENOUS
  Filled 2016-11-26 (×6): qty 1

## 2016-11-26 MED ORDER — BISACODYL 5 MG PO TBEC
10.0000 mg | DELAYED_RELEASE_TABLET | Freq: Every day | ORAL | Status: DC
  Administered 2016-11-27 – 2016-11-29 (×3): 10 mg via ORAL
  Filled 2016-11-26 (×5): qty 2

## 2016-11-26 MED ORDER — MIDAZOLAM HCL 5 MG/5ML IJ SOLN
INTRAMUSCULAR | Status: DC | PRN
  Administered 2016-11-26: 2 mg via INTRAVENOUS
  Administered 2016-11-26: 3 mg via INTRAVENOUS
  Administered 2016-11-26: 2 mg via INTRAVENOUS

## 2016-11-26 MED ORDER — DEXMEDETOMIDINE HCL IN NACL 400 MCG/100ML IV SOLN
0.0000 ug/kg/h | INTRAVENOUS | Status: DC
  Administered 2016-11-26: 0.4 ug/kg/h via INTRAVENOUS
  Administered 2016-11-26: 0.5 ug/kg/h via INTRAVENOUS
  Filled 2016-11-26 (×2): qty 100

## 2016-11-26 MED ORDER — DEXTROSE 5 % IV SOLN
INTRAVENOUS | Status: DC | PRN
  Administered 2016-11-26: .75 g via INTRAVENOUS

## 2016-11-26 MED ORDER — MORPHINE SULFATE (PF) 4 MG/ML IV SOLN
1.0000 mg | INTRAVENOUS | Status: AC | PRN
  Administered 2016-11-26: 4 mg via INTRAVENOUS
  Administered 2016-11-27: 2 mg via INTRAVENOUS
  Filled 2016-11-26 (×3): qty 1

## 2016-11-26 MED ORDER — FENTANYL CITRATE (PF) 250 MCG/5ML IJ SOLN
INTRAMUSCULAR | Status: DC | PRN
  Administered 2016-11-26 (×4): 250 ug via INTRAVENOUS
  Administered 2016-11-26: 200 ug via INTRAVENOUS
  Administered 2016-11-26 (×2): 250 ug via INTRAVENOUS
  Administered 2016-11-26: 50 ug via INTRAVENOUS

## 2016-11-26 MED FILL — Heparin Sodium (Porcine) Inj 1000 Unit/ML: INTRAMUSCULAR | Qty: 30 | Status: AC

## 2016-11-26 MED FILL — Sodium Chloride IV Soln 0.9%: INTRAVENOUS | Qty: 250 | Status: AC

## 2016-11-26 MED FILL — Magnesium Sulfate Inj 50%: INTRAMUSCULAR | Qty: 10 | Status: AC

## 2016-11-26 MED FILL — Mannitol IV Soln 20%: INTRAVENOUS | Qty: 1000 | Status: AC

## 2016-11-26 MED FILL — Phenylephrine HCl Inj 10 MG/ML: INTRAMUSCULAR | Qty: 2 | Status: AC

## 2016-11-26 MED FILL — Potassium Chloride Inj 2 mEq/ML: INTRAVENOUS | Qty: 40 | Status: AC

## 2016-11-26 MED FILL — Cefuroxime Sodium For IV Soln 1.5 GM: INTRAVENOUS | Qty: 1.5 | Status: AC

## 2016-11-26 MED FILL — Lidocaine HCl IV Inj 20 MG/ML: INTRAVENOUS | Qty: 25 | Status: AC

## 2016-11-26 MED FILL — Electrolyte-R (PH 7.4) Solution: INTRAVENOUS | Qty: 3000 | Status: AC

## 2016-11-26 MED FILL — Sodium Chloride IV Soln 0.9%: INTRAVENOUS | Qty: 2000 | Status: AC

## 2016-11-26 MED FILL — Dexmedetomidine HCl in NaCl 0.9% IV Soln 400 MCG/100ML: INTRAVENOUS | Qty: 100 | Status: AC

## 2016-11-26 SURGICAL SUPPLY — 106 items
BAG DECANTER FOR FLEXI CONT (MISCELLANEOUS) ×6 IMPLANT
BANDAGE ACE 4X5 VEL STRL LF (GAUZE/BANDAGES/DRESSINGS) ×3 IMPLANT
BANDAGE ACE 6X5 VEL STRL LF (GAUZE/BANDAGES/DRESSINGS) ×3 IMPLANT
BASKET HEART (ORDER IN 25'S) (MISCELLANEOUS) ×1
BASKET HEART (ORDER IN 25S) (MISCELLANEOUS) ×2 IMPLANT
BLADE CLIPPER SURG (BLADE) IMPLANT
BLADE STERNUM SYSTEM 6 (BLADE) ×3 IMPLANT
BNDG GAUZE ELAST 4 BULKY (GAUZE/BANDAGES/DRESSINGS) ×3 IMPLANT
CANISTER SUCT 3000ML PPV (MISCELLANEOUS) ×3 IMPLANT
CANNULA EZ GLIDE AORTIC 21FR (CANNULA) ×5 IMPLANT
CATH CPB KIT OWEN (MISCELLANEOUS) ×3 IMPLANT
CATH THORACIC 36FR (CATHETERS) ×3 IMPLANT
CLIP RETRACTION 3.0MM CORONARY (MISCELLANEOUS) ×1 IMPLANT
CLIP VESOCCLUDE MED 24/CT (CLIP) ×2 IMPLANT
CLIP VESOCCLUDE SM WIDE 24/CT (CLIP) ×3 IMPLANT
CRADLE DONUT ADULT HEAD (MISCELLANEOUS) ×3 IMPLANT
DRAIN CHANNEL 32F RND 10.7 FF (WOUND CARE) ×6 IMPLANT
DRAPE CARDIOVASCULAR INCISE (DRAPES) ×3
DRAPE INCISE IOBAN 66X45 STRL (DRAPES) ×3 IMPLANT
DRAPE SLUSH/WARMER DISC (DRAPES) ×3 IMPLANT
DRAPE SRG 135X102X78XABS (DRAPES) ×2 IMPLANT
DRSG AQUACEL AG ADV 3.5X14 (GAUZE/BANDAGES/DRESSINGS) ×1 IMPLANT
DRSG COVADERM 4X14 (GAUZE/BANDAGES/DRESSINGS) ×2 IMPLANT
ELECT BLADE 4.0 EZ CLEAN MEGAD (MISCELLANEOUS) ×3
ELECT REM PT RETURN 9FT ADLT (ELECTROSURGICAL) ×6
ELECTRODE BLDE 4.0 EZ CLN MEGD (MISCELLANEOUS) ×2 IMPLANT
ELECTRODE REM PT RTRN 9FT ADLT (ELECTROSURGICAL) ×4 IMPLANT
FELT TEFLON 1X6 (MISCELLANEOUS) ×5 IMPLANT
GAUZE SPONGE 4X4 12PLY STRL (GAUZE/BANDAGES/DRESSINGS) ×6 IMPLANT
GLOVE ORTHO TXT STRL SZ7.5 (GLOVE) ×7 IMPLANT
GOWN STRL REUS W/ TWL LRG LVL3 (GOWN DISPOSABLE) ×8 IMPLANT
GOWN STRL REUS W/TWL LRG LVL3 (GOWN DISPOSABLE) ×24
HEMOSTAT POWDER SURGIFOAM 1G (HEMOSTASIS) ×9 IMPLANT
INSERT FOGARTY XLG (MISCELLANEOUS) ×3 IMPLANT
KIT BASIN OR (CUSTOM PROCEDURE TRAY) ×3 IMPLANT
KIT ROOM TURNOVER OR (KITS) ×3 IMPLANT
KIT SUCTION CATH 14FR (SUCTIONS) ×9 IMPLANT
KIT VASOVIEW HEMOPRO VH 3000 (KITS) ×3 IMPLANT
LEAD PACING MYOCARDI (MISCELLANEOUS) ×3 IMPLANT
MARKER GRAFT CORONARY BYPASS (MISCELLANEOUS) ×9 IMPLANT
NS IRRIG 1000ML POUR BTL (IV SOLUTION) ×15 IMPLANT
PACK OPEN HEART (CUSTOM PROCEDURE TRAY) ×3 IMPLANT
PAD ARMBOARD 7.5X6 YLW CONV (MISCELLANEOUS) ×6 IMPLANT
PAD ELECT DEFIB RADIOL ZOLL (MISCELLANEOUS) ×3 IMPLANT
PENCIL BUTTON HOLSTER BLD 10FT (ELECTRODE) ×3 IMPLANT
PUNCH AORTIC ROT 4.0MM RCL 40 (MISCELLANEOUS) ×1 IMPLANT
PUNCH AORTIC ROTATE 4.0MM (MISCELLANEOUS) IMPLANT
PUNCH AORTIC ROTATE 4.5MM 8IN (MISCELLANEOUS) IMPLANT
PUNCH AORTIC ROTATE 5MM 8IN (MISCELLANEOUS) IMPLANT
SET CARDIOPLEGIA MPS 5001102 (MISCELLANEOUS) ×1 IMPLANT
SOLUTION ANTI FOG 6CC (MISCELLANEOUS) IMPLANT
SPONGE LAP 18X18 X RAY DECT (DISPOSABLE) IMPLANT
SPONGE LAP 4X18 X RAY DECT (DISPOSABLE) IMPLANT
SUT BONE WAX W31G (SUTURE) ×3 IMPLANT
SUT ETHIBOND 2 0 SH (SUTURE) ×9
SUT ETHIBOND 2 0 SH 36X2 (SUTURE) IMPLANT
SUT ETHIBOND X763 2 0 SH 1 (SUTURE) ×6 IMPLANT
SUT MNCRL AB 3-0 PS2 18 (SUTURE) ×6 IMPLANT
SUT MNCRL AB 4-0 PS2 18 (SUTURE) IMPLANT
SUT PDS AB 1 CTX 36 (SUTURE) ×6 IMPLANT
SUT PROLENE 2 0 SH DA (SUTURE) IMPLANT
SUT PROLENE 3 0 SH DA (SUTURE) ×3 IMPLANT
SUT PROLENE 3 0 SH1 36 (SUTURE) IMPLANT
SUT PROLENE 4 0 RB 1 (SUTURE) ×3
SUT PROLENE 4 0 SH DA (SUTURE) IMPLANT
SUT PROLENE 4-0 RB1 .5 CRCL 36 (SUTURE) IMPLANT
SUT PROLENE 5 0 C 1 36 (SUTURE) ×2 IMPLANT
SUT PROLENE 6 0 C 1 30 (SUTURE) ×4 IMPLANT
SUT PROLENE 7.0 RB 3 (SUTURE) ×14 IMPLANT
SUT PROLENE 8 0 BV175 6 (SUTURE) ×6 IMPLANT
SUT PROLENE BLUE 7 0 (SUTURE) ×4 IMPLANT
SUT PROLENE POLY MONO (SUTURE) IMPLANT
SUT SILK  1 MH (SUTURE) ×4
SUT SILK 1 MH (SUTURE) ×2 IMPLANT
SUT SILK 1 TIES 10X30 (SUTURE) ×1 IMPLANT
SUT SILK 2 0 SH CR/8 (SUTURE) ×2 IMPLANT
SUT SILK 2 0 TIES 10X30 (SUTURE) ×1 IMPLANT
SUT SILK 2 0 TIES 17X18 (SUTURE) ×3
SUT SILK 2-0 18XBRD TIE BLK (SUTURE) IMPLANT
SUT SILK 3 0 SH CR/8 (SUTURE) ×1 IMPLANT
SUT SILK 4 0 TIE 10X30 (SUTURE) ×2 IMPLANT
SUT STEEL 6MS V (SUTURE) IMPLANT
SUT STEEL STERNAL CCS#1 18IN (SUTURE) IMPLANT
SUT STEEL SZ 6 DBL 3X14 BALL (SUTURE) IMPLANT
SUT TEM PAC WIRE 2 0 SH (SUTURE) ×4 IMPLANT
SUT VIC AB 1 CTX 18 (SUTURE) ×2 IMPLANT
SUT VIC AB 1 CTX 36 (SUTURE)
SUT VIC AB 1 CTX36XBRD ANBCTR (SUTURE) IMPLANT
SUT VIC AB 2-0 CT1 27 (SUTURE) ×3
SUT VIC AB 2-0 CT1 TAPERPNT 27 (SUTURE) IMPLANT
SUT VIC AB 2-0 CTX 27 (SUTURE) ×2 IMPLANT
SUT VIC AB 3-0 SH 27 (SUTURE)
SUT VIC AB 3-0 SH 27X BRD (SUTURE) IMPLANT
SUT VIC AB 3-0 X1 27 (SUTURE) ×1 IMPLANT
SUT VICRYL 4-0 PS2 18IN ABS (SUTURE) IMPLANT
SUTURE E-PAK OPEN HEART (SUTURE) ×2 IMPLANT
SYSTEM SAHARA CHEST DRAIN ATS (WOUND CARE) ×3 IMPLANT
TOWEL GREEN STERILE (TOWEL DISPOSABLE) ×9 IMPLANT
TOWEL GREEN STERILE FF (TOWEL DISPOSABLE) ×5 IMPLANT
TOWEL OR 17X24 6PK STRL BLUE (TOWEL DISPOSABLE) ×4 IMPLANT
TOWEL OR 17X26 10 PK STRL BLUE (TOWEL DISPOSABLE) ×4 IMPLANT
TRAY FOLEY SILVER 16FR TEMP (SET/KITS/TRAYS/PACK) ×3 IMPLANT
TUBE SUCTION CARDIAC 10FR (CANNULA) ×1 IMPLANT
TUBING INSUFFLATION (TUBING) ×3 IMPLANT
UNDERPAD 30X30 (UNDERPADS AND DIAPERS) ×3 IMPLANT
WATER STERILE IRR 1000ML POUR (IV SOLUTION) ×6 IMPLANT

## 2016-11-26 NOTE — Progress Notes (Addendum)
Patient came back from open heart surgery and placed on vent at 1310. No apparent complications, patient vitals are stable, RN at bedside, RT will continue to monitor and wean per rapid protocol as can.

## 2016-11-26 NOTE — OR Nursing (Signed)
First call to SICU charge nurse at 1200.

## 2016-11-26 NOTE — Progress Notes (Signed)
Discussed pt's lower CO/CI with Dr. Servando Snare. He said to go ahead and begin rapid wean protocol regardless. No further changes or orders at this time. All other values in parameters at this time.  Rapid Wean Protocol started at 23:13 by respiratory therapist. Will continue to monitor closely.

## 2016-11-26 NOTE — Progress Notes (Signed)
Report given to Anesthesiology. Beta blocker given, no cp overnight, last BG as of 05:50 is 216. Will go down with pt.

## 2016-11-26 NOTE — Op Note (Signed)
CARDIOTHORACIC SURGERY OPERATIVE NOTE  Date of Procedure: 11/26/2016  Preoperative Diagnosis:   Severe 3-vessel Coronary Artery Disease  S/P Acute Non-ST Segment Elevation Myocardial Infarction  Postoperative Diagnosis: Same  Procedure:    Coronary Artery Bypass Grafting x 4   Left Internal Mammary Artery to Distal Left Anterior Descending Coronary Artery  Saphenous Vein Graft to Right Posterolateral Branch Coronary Artery  Saphenous Vein Graft to Obtuse Marginal Branch of Left Circumflex Coronary Artery  Sapheonous Vein Graft to First Diagonal Branch Coronary Artery  Endoscopic Vein Harvest from Right Thigh and Lower Leg  Surgeon: Valentina Gu. Roxy Manns, MD  Assistant: John Giovanni, PA-C  Anesthesia: Rica Koyanagi, MD  Operative Findings:  Normal left ventricular systolic function  Good quality left internal mammary artery conduit  Good quality saphenous vein conduit  Good quality target vessels for grafting with exception of terminal branches of RCA    BRIEF CLINICAL NOTE AND INDICATIONS FOR SURGERY  Patient is a 46 year old obese African-American female with no previous history of coronary artery disease but risk factors notable for history of insulin-dependent type 2 diabetes mellitus,hyperlipidemia, and chronic immunosuppression who has been referred for surgical consultation to discuss treatment options for management of severe three-vessel coronary artery disease status post acute non-ST segment elevation myocardial infarction.  Patient developed sudden onset dull substernal chest pain associated with nausea and vomiting and radiation to the arms yesterday morning. Symptoms became severe and persisted ultimately prompting the patient presented to the emergency department where 12-lead EKG reveals sinus rhythm without acute ST segment elevation. Troponin levels were elevated.  The patient was admitted to the hospital and started on intravenous heparin and nitroglycerin.  Chest pain has improved. The patient underwent diagnostic cardiac catheterization earlier today by Dr. Burt Knack and was found to have severe three-vessel coronary artery disease with preserved left ventricular systolic function. Cardiothoracic surgical consultation was requested. The patient has been seen in consultation and counseled at length regarding the indications, risks and potential benefits of surgery.  All questions have been answered, and the patient provides full informed consent for the operation as described.    DETAILS OF THE OPERATIVE PROCEDURE  Preparation:  The patient is brought to the operating room on the above mentioned date and central monitoring was established by the anesthesia team including placement of Swan-Ganz catheter and radial arterial line. The patient is placed in the supine position on the operating table.  Intravenous antibiotics are administered. General endotracheal anesthesia is induced uneventfully. A Foley catheter is placed.  Baseline transesophageal echocardiogram was performed.  Findings were notable for normal LV systolic function.  The patient's chest, abdomen, both groins, and both lower extremities are prepared and draped in a sterile manner. A time out procedure is performed.   Surgical Approach and Conduit Harvest:  A median sternotomy incision was performed and the left internal mammary artery is dissected from the chest wall and prepared for bypass grafting. The left internal mammary artery is notably good quality conduit. Simultaneously, the greater saphenous vein is obtained from the patient's right thigh and leg using endoscopic vein harvest technique. The saphenous vein is notably good quality conduit. After removal of the saphenous vein, the small surgical incisions in the lower extremity are closed with absorbable suture. Following systemic heparinization, the left internal mammary artery was transected distally noted to have excellent  flow.   Extracorporeal Cardiopulmonary Bypass and Myocardial Protection:  The pericardium is opened. The ascending aorta is normal in appearance. The ascending aorta and the right  atrium are cannulated for cardiopulmonary bypass.  Adequate heparinization is verified.     The entire pre-bypass portion of the operation was notable for stable hemodynamics.  Cardiopulmonary bypass was begun and the surface of the heart is inspected. Distal target vessels are selected for coronary artery bypass grafting. A cardioplegia cannula is placed in the ascending aorta.  A temperature probe was placed in the interventricular septum.  The patient is allowed to cool passively to [facility] systemic temperature.  The aortic cross clamp is applied and cold blood cardioplegia is delivered initially in an antegrade fashion through the aortic root.   Iced saline slush is applied for topical hypothermia.  The initial cardioplegic arrest is rapid with early diastolic arrest.  Repeat doses of cardioplegia are administered intermittently throughout the entire cross clamp portion of the operation through the aortic root and through subsequently placed vein grafts in order to maintain completely flat electrocardiogram and septal myocardial temperature below 15C.  Myocardial protection was felt to be excellent.  Coronary Artery Bypass Grafting:   The posterolateral branch of the right coronary artery was grafted using a reversed saphenous vein graft in an end-to-side fashion.  At the site of distal anastomosis the target vessel was fair quality and measured approximately 1.4 mm in diameter.  This was the largest and best quality target vessel off of the distal right coronary artery.  The obtuse marginal branch of the left circumflex coronary artery was grafted using a reversed saphenous vein graft in an end-to-side fashion.  At the site of distal anastomosis the target vessel was good quality and measured approximately 2.0 mm in  diameter.  The first diagonal branch of the left anterior descending coronary artery was grafted using a reversed saphenous vein graft in an end-to-side fashion.  At the site of distal anastomosis the target vessel was good quality and measured approximately 1.8 mm in diameter.  The distal left anterior coronary artery was grafted with the left internal mammary artery in an end-to-side fashion.  At the site of distal anastomosis the target vessel was good quality and measured approximately 2.2 mm in diameter.  All proximal vein graft anastomoses were placed directly to the ascending aorta prior to removal of the aortic cross clamp.  The septal myocardial temperature rose rapidly after reperfusion of the left internal mammary artery graft.  The aortic cross clamp was removed after a total cross clamp time of 79 minutes.   Procedure Completion:  All proximal and distal coronary anastomoses were inspected for hemostasis and appropriate graft orientation. Epicardial pacing wires are fixed to the right ventricular outflow tract and to the right atrial appendage. The patient is rewarmed to 37C temperature. The patient is weaned and disconnected from cardiopulmonary bypass.  The patient's rhythm at separation from bypass was sinus.  The patient was weaned from cardiopulmonary bypass without any inotropic support. Total cardiopulmonary bypass time for the operation was 93 minutes.  Followup transesophageal echocardiogram performed after separation from bypass revealed no changes from the preoperative exam.  The aortic and venous cannula were removed uneventfully. Protamine was administered to reverse the anticoagulation. The mediastinum and pleural space were inspected for hemostasis and irrigated with saline solution. The mediastinum and the left pleural space were drained using 3 chest tubes placed through separate stab incisions inferiorly.  The soft tissues anterior to the aorta were reapproximated  loosely. The sternum is closed with double strength sternal wire. The soft tissues anterior to the sternum were closed in multiple layers and the  skin is closed with a running subcuticular skin closure.  The post-bypass portion of the operation was notable for stable rhythm and hemodynamics.  No blood products were administered during the operation.   Disposition:  The patient tolerated the procedure well and is transported to the surgical intensive care in stable condition. There are no intraoperative complications. All sponge instrument and needle counts are verified correct at completion of the operation.    Valentina Gu. Roxy Manns MD 11/26/2016 12:40 PM

## 2016-11-26 NOTE — Progress Notes (Signed)
Patient ID: [name] [name], female   DOB: 09-11-1970, 46 y.o.   MRN: [id number] EVENING ROUNDS NOTE :     [address]       [place] [address]             [phone]                 Day of Surgery Procedure(s) (LRB): CORONARY ARTERY BYPASS GRAFTING (CABG), ON PUMP, TIMES FOUR, USING LEFT INTERNAL MAMMARY ARTERY AND ENDOSCOPICALLY HARVESTED RIGHT GREATER SAPHENOUS VEIN (N/A) TRANSESOPHAGEAL ECHOCARDIOGRAM (TEE) (N/A)  Total Length of Stay:  LOS: 4 days  BP (!) 93/59   Pulse (!) 104   Temp (!) 97 F (36.1 C)   Resp 17   Ht 5' 1"  (1.549 m)   Wt 216 lb 4.8 oz (98.1 kg)   SpO2 100%   BMI 40.87 kg/m   .Intake/Output      10/26 0701 - 10/27 0700   I.V. (mL/kg) 3276 (33.4)   Blood 780   IV Piggyback 1450   Total Intake(mL/kg) 5506 (56.1)   Urine (mL/kg/hr) 2490 (2.1)   Chest Tube 250   Total Output 2740   Net +2766         . sodium chloride 20 mL/hr at 11/26/16 1330  . sodium chloride 100 mL/hr at 11/26/16 1331  . [START ON 11/27/2016] sodium chloride    . sodium chloride 20 mL/hr at 11/26/16 1730  . cefUROXime (ZINACEF)  IV    . dexmedetomidine (PRECEDEX) IV infusion 0.5 mcg/kg/hr (11/26/16 1440)  . famotidine (PEPCID) IV 20 mg (11/26/16 1328)  . insulin (NOVOLIN-R) infusion 2 Units/hr (11/26/16 1700)  . lactated ringers    . lactated ringers 20 mL/hr at 11/26/16 1341  . milrinone 0.3 mcg/kg/min (11/26/16 1730)  . nitroGLYCERIN Stopped (11/26/16 1730)  . phenylephrine (NEO-SYNEPHRINE) Adult infusion Stopped (11/26/16 1540)  . vancomycin       Lab Results  Component Value Date   WBC 10.8 (H) 11/26/2016   HGB 9.5 (L) 11/26/2016   HCT 29.1 (L) 11/26/2016   PLT 153 11/26/2016   GLUCOSE 149 (H) 11/26/2016   CHOL 132 11/23/2016   TRIG 103 11/23/2016   HDL 38 (L) 11/23/2016   LDLCALC 73 11/23/2016   ALT 23 11/24/2016   AST 56 (H) 11/24/2016   NA 144 11/26/2016   K 3.6 11/26/2016   CL 105 11/26/2016   CREATININE 0.65 11/26/2016   BUN 9  11/26/2016   CO2 21 (L) 11/26/2016   TSH 48.374 (H) 11/24/2016   INR 1.45 11/26/2016   HGBA1C 9.4 (H) 11/24/2016   Not bleeding , CI improved on milrinone and volume On synthroid now and on admission but TSH Industry MD  Beeper [phone] Office [phone] 11/26/2016 7:16 PM

## 2016-11-26 NOTE — Anesthesia Postprocedure Evaluation (Signed)
Anesthesia Post Note  Patient: [name] [name]  Procedure(s) Performed: CORONARY ARTERY BYPASS GRAFTING (CABG), ON PUMP, TIMES FOUR, USING LEFT INTERNAL MAMMARY ARTERY AND ENDOSCOPICALLY HARVESTED RIGHT GREATER SAPHENOUS VEIN (N/A Chest) TRANSESOPHAGEAL ECHOCARDIOGRAM (TEE) (N/A )     Patient location during evaluation: SICU Anesthesia Type: General Level of consciousness: sedated and patient remains intubated per anesthesia plan Pain management: pain level controlled Vital Signs Assessment: post-procedure vital signs reviewed and stable Respiratory status: patient remains intubated per anesthesia plan and respiratory function stable Cardiovascular status: stable Postop Assessment: no apparent nausea or vomiting Anesthetic complications: no    Last Vitals:  Vitals:   11/26/16 1310 11/26/16 1330  BP: 122/85   Pulse: 93 94  Resp: 16 13  Temp:  (!) 35.2 C  SpO2: 97% 100%    Last Pain:  Vitals:   11/26/16 0838  TempSrc: Oral  PainSc:                  [name] [name],[name] [name]

## 2016-11-26 NOTE — Anesthesia Procedure Notes (Signed)
Arterial Line Insertion Start/End10/26/2018 7:05 AM, 11/26/2016 7:15 AM Performed by: Kerby Less, CRNA  Patient location: Pre-op. Preanesthetic checklist: patient identified, IV checked, site marked, risks and benefits discussed, surgical consent, monitors and equipment checked, pre-op evaluation and timeout performed Lidocaine 1% used for infiltration radial was placed Catheter size: 20 G Hand hygiene performed , maximum sterile barriers used  and Seldinger technique used Allen's test indicative of satisfactory collateral circulation Attempts: 1 Procedure performed without using ultrasound guided technique. Following insertion, dressing applied and Biopatch. Post procedure assessment: normal  Patient tolerated the procedure well with no immediate complications.

## 2016-11-26 NOTE — Anesthesia Procedure Notes (Addendum)
Central Venous Catheter Insertion Performed by: Rica Koyanagi, anesthesiologist Start/End10/26/2018 7:00 AM, 11/26/2016 7:16 AM Patient location: Pre-op. Preanesthetic checklist: patient identified, IV checked, site marked, risks and benefits discussed, surgical consent, monitors and equipment checked, pre-op evaluation, timeout performed and anesthesia consent Position: Trendelenburg Lidocaine 1% used for infiltration and patient sedated Hand hygiene performed  and maximum sterile barriers used  Catheter size: 8.5 Fr PA cath was placed.MAC introducer Swan type:thermodilution Procedure performed without using ultrasound guided technique. Ultrasound Notes:anatomy identified, needle tip was noted to be adjacent to the nerve/plexus identified, no ultrasound evidence of intravascular and/or intraneural injection and image(s) printed for medical record Attempts: 1 Following insertion, line sutured, dressing applied and Biopatch. Post procedure assessment: blood return through all ports  Patient tolerated the procedure well with no immediate complications.

## 2016-11-26 NOTE — Progress Notes (Signed)
Pt flipped to CPAP/PS by respiratory therapist per protocol. Will continue to monitor and draw ABG at 23:53.

## 2016-11-26 NOTE — Anesthesia Procedure Notes (Signed)
Procedure Name: Intubation Date/Time: 11/26/2016 7:51 AM Performed by: Rebekah Chesterfield L Pre-anesthesia Checklist: Patient identified, Emergency Drugs available, Suction available and Patient being monitored Patient Re-evaluated:Patient Re-evaluated prior to induction Oxygen Delivery Method: Circle System Utilized Preoxygenation: Pre-oxygenation with 100% oxygen Induction Type: IV induction Ventilation: Mask ventilation with difficulty and Oral airway inserted - appropriate to patient size Laryngoscope Size: Mac and 3 Grade View: Grade II Tube type: Oral Tube size: 7.5 mm Number of attempts: 1 Airway Equipment and Method: Stylet and Oral airway Placement Confirmation: ETT inserted through vocal cords under direct vision,  positive ETCO2 and breath sounds checked- equal and bilateral Secured at: 20 cm Tube secured with: Tape Dental Injury: Teeth and Oropharynx as per pre-operative assessment

## 2016-11-26 NOTE — Progress Notes (Signed)
PT Cancellation Note  Patient Details Name: [name] [name] MRN: [id number] DOB: 1970-05-17   Cancelled Treatment:    Reason Eval/Treat Not Completed: Patient at procedure or test/unavailable. Pt currently off unit for surgery. Will continue to follow and initiate PT eval when pt is medically ready.    Thelma Comp 11/26/2016, 7:19 AM   Rolinda Roan, PT, DPT Acute Rehabilitation Services Pager: [phone]

## 2016-11-26 NOTE — OR Nursing (Signed)
Twenty minute call to SICU charge nurse at 1229. Spoke to [employer]. Cath Lab also notified of timing.

## 2016-11-26 NOTE — Transfer of Care (Signed)
Immediate Anesthesia Transfer of Care Note  Patient: [name] [name]  Procedure(s) Performed: CORONARY ARTERY BYPASS GRAFTING (CABG), ON PUMP, TIMES FOUR, USING LEFT INTERNAL MAMMARY ARTERY AND ENDOSCOPICALLY HARVESTED RIGHT GREATER SAPHENOUS VEIN (N/A Chest) TRANSESOPHAGEAL ECHOCARDIOGRAM (TEE) (N/A )  Patient Location: SICU  Anesthesia Type:General  Level of Consciousness: sedated, unresponsive and Patient remains intubated per anesthesia plan  Airway & Oxygen Therapy: Patient remains intubated per anesthesia plan and Patient placed on Ventilator (see vital sign flow sheet for setting)  Post-op Assessment: Report given to RN, Post -op Vital signs reviewed and stable and Patient moving all extremities  Post vital signs: Reviewed and stable  Last Vitals:  Vitals:   11/26/16 0838 11/26/16 1310  BP: 126/68 122/85  Pulse: (!) 52 93  Resp:  16  Temp: 36.7 C   SpO2: 100% 97%    Last Pain:  Vitals:   11/26/16 0838  TempSrc: Oral  PainSc:       Patients Stated Pain Goal: 0 (91/63/84 6659)  Complications: No apparent anesthesia complications

## 2016-11-26 NOTE — Care Management Note (Addendum)
Case Management Note  Patient Details  Name: [name] [name] MRN: [id number] Date of Birth: February 09, 1970  Subjective/Objective:  From home , post op CABG, conts on vent.  10/30 Wheeler, BSN- from home, POD 3 CABG, she states her youngest daughter will be with her til Nov 4th and the oldest daughter will be with her til Nov 11th , then her other family members will help assist her there after.  She states she is for poss dc tomorrow, she states she goes to the [facility] in [place], and she gets her medications from the [facility] in [place] her PCP in [place] is [employer].  Will need to fax DC summary to her PCP at the [facility] at discharge.                    Action/Plan: NCM will follow progression.  Expected Discharge Date:                  Expected Discharge Plan:     In-House Referral:     Discharge planning Services  CM Consult  Post Acute Care Choice:    Choice offered to:     DME Arranged:    DME Agency:     HH Arranged:    HH Agency:     Status of Service:  In process, will continue to follow  If discussed at Long Length of Stay Meetings, dates discussed:    Additional Comments:  Zenon Mayo, RN 11/26/2016, 4:28 PM

## 2016-11-26 NOTE — Brief Op Note (Addendum)
11/22/2016 - 11/26/2016  12:35 PM  PATIENT:  [name] [name]  46 y.o. female  PRE-OPERATIVE DIAGNOSIS:  CAD  POST-OPERATIVE DIAGNOSIS:  CAD  PROCEDURE:  Procedure(s) with comments: CORONARY ARTERY BYPASS GRAFTING (CABG), ON PUMP, TIMES FOUR, USING LEFT INTERNAL MAMMARY ARTERY AND ENDOSCOPICALLY HARVESTED RIGHT GREATER SAPHENOUS VEIN (N/A) - LIMA-LAD, SVG-OM, SVG-PL, SVG-DIAG TRANSESOPHAGEAL ECHOCARDIOGRAM (TEE) (N/A) LIMA-LAD SVG-OM SVG-PL SVG-DIAG  SURGEON:  Surgeon(s) and Role:    * Rexene Alberts, MD - Primary  PHYSICIAN ASSISTANT: WAYNE GOLD PA-C  ANESTHESIA:   general  EBL:     BLOOD ADMINISTERED:none  DRAINS: 2 CHEST TUBES   LOCAL MEDICATIONS USED:  NONE  SPECIMEN:  No Specimen  DISPOSITION OF SPECIMEN:  N/A  COUNTS:  YES  TOURNIQUET:  * No tourniquets in log *  DICTATION: .Dragon Dictation  PLAN OF CARE: Admit to inpatient   PATIENT DISPOSITION:  ICU - intubated and hemodynamically stable.   Delay start of Pharmacological VTE agent (>24hrs) due to surgical blood loss or risk of bleeding: yes  Rexene Alberts, MD 11/26/2016 12:35 PM

## 2016-11-26 NOTE — Anesthesia Preprocedure Evaluation (Addendum)
Anesthesia Evaluation  Patient identified by MRN, date of birth, ID band  Reviewed: Allergy & Precautions, NPO status , Patient's Chart, lab work & pertinent test results  History of Anesthesia Complications Negative for: history of anesthetic complications  Airway Mallampati: III  TM Distance: <3 FB Neck ROM: Full    Dental no notable dental hx.    Pulmonary sleep apnea ,    breath sounds clear to auscultation       Cardiovascular hypertension, + angina with exertion + CAD and + Past MI   Rhythm:Regular Rate:Normal     Neuro/Psych  Neuromuscular disease    GI/Hepatic PUD, GERD  ,  Endo/Other  diabetesHyperthyroidism   Renal/GU      Musculoskeletal  (+) Arthritis , Fibromyalgia -  Abdominal (+) + obese,   Peds  Hematology  (+) anemia ,   Anesthesia Other Findings   Reproductive/Obstetrics                            Anesthesia Physical Anesthesia Plan  ASA: III  Anesthesia Plan: General   Post-op Pain Management:    Induction: Intravenous  PONV Risk Score and Plan: 4 or greater and Ondansetron, Dexamethasone, Midazolam, Scopolamine patch - Pre-op, Propofol infusion and Treatment may vary due to age or medical condition  Airway Management Planned: Oral ETT  Additional Equipment: Arterial line, PA Cath, TEE and Ultrasound Guidance Line Placement  Intra-op Plan:   Post-operative Plan: Post-operative intubation/ventilation  Informed Consent: I have reviewed the patients History and Physical, chart, labs and discussed the procedure including the risks, benefits and alternatives for the proposed anesthesia with the patient or authorized representative who has indicated his/her understanding and acceptance.   Dental advisory given  Plan Discussed with: CRNA  Anesthesia Plan Comments:        Anesthesia Quick Evaluation

## 2016-11-26 NOTE — Plan of Care (Signed)
Problem: Pain Management Goal: Pain control Patient will demonstrate personal actions to control pain.  Outcome: Progressing Pt denied cp or other pain this shift. Pt was anxious about upcomming CABG and inquired about sleep aid. PRN medication given per orders. Will continue to monitor and assess.

## 2016-11-27 ENCOUNTER — Inpatient Hospital Stay (HOSPITAL_COMMUNITY): Payer: PRIVATE HEALTH INSURANCE

## 2016-11-27 DIAGNOSIS — Z951 Presence of aortocoronary bypass graft: Secondary | ICD-10-CM

## 2016-11-27 LAB — CBC
HEMATOCRIT: 29.9 % — AB (ref 36.0–46.0)
HEMATOCRIT: 31.1 % — AB (ref 36.0–46.0)
Hemoglobin: 9.7 g/dL — ABNORMAL LOW (ref 12.0–15.0)
Hemoglobin: 10 g/dL — ABNORMAL LOW (ref 12.0–15.0)
MCH: 29 pg (ref 26.0–34.0)
MCH: 29.1 pg (ref 26.0–34.0)
MCHC: 32.4 g/dL (ref 30.0–36.0)
MCHC: 32.2 g/dL (ref 30.0–36.0)
MCV: 89.3 fL (ref 78.0–100.0)
MCV: 90.4 fL (ref 78.0–100.0)
PLATELETS: 203 10*3/uL (ref 150–400)
Platelets: 201 10*3/uL (ref 150–400)
RBC: 3.35 MIL/uL — ABNORMAL LOW (ref 3.87–5.11)
RBC: 3.44 MIL/uL — ABNORMAL LOW (ref 3.87–5.11)
RDW: 15.3 % (ref 11.5–15.5)
RDW: 15.7 % — AB (ref 11.5–15.5)
WBC: 9.3 10*3/uL (ref 4.0–10.5)
WBC: 12.2 10*3/uL — ABNORMAL HIGH (ref 4.0–10.5)

## 2016-11-27 LAB — GLUCOSE, CAPILLARY
GLUCOSE-CAPILLARY: 89 mg/dL (ref 65–99)
GLUCOSE-CAPILLARY: 96 mg/dL (ref 65–99)
GLUCOSE-CAPILLARY: 101 mg/dL — AB (ref 65–99)
GLUCOSE-CAPILLARY: 118 mg/dL — AB (ref 65–99)
GLUCOSE-CAPILLARY: 133 mg/dL — AB (ref 65–99)
Glucose-Capillary: 108 mg/dL — ABNORMAL HIGH (ref 65–99)
Glucose-Capillary: 112 mg/dL — ABNORMAL HIGH (ref 65–99)
Glucose-Capillary: 120 mg/dL — ABNORMAL HIGH (ref 65–99)
Glucose-Capillary: 122 mg/dL — ABNORMAL HIGH (ref 65–99)
Glucose-Capillary: 126 mg/dL — ABNORMAL HIGH (ref 65–99)
Glucose-Capillary: 141 mg/dL — ABNORMAL HIGH (ref 65–99)
Glucose-Capillary: 143 mg/dL — ABNORMAL HIGH (ref 65–99)
Glucose-Capillary: 89 mg/dL (ref 65–99)

## 2016-11-27 LAB — POCT I-STAT 3, ART BLOOD GAS (G3+)
Acid-base deficit: 4 mmol/L — ABNORMAL HIGH (ref 0.0–2.0)
Acid-base deficit: 5 mmol/L — ABNORMAL HIGH (ref 0.0–2.0)
Bicarbonate: 20.4 mmol/L (ref 20.0–28.0)
Bicarbonate: 21.1 mmol/L (ref 20.0–28.0)
O2 SAT: 98 %
O2 Saturation: 98 %
PCO2 ART: 39.2 mmHg (ref 32.0–48.0)
PCO2 ART: 40.4 mmHg (ref 32.0–48.0)
PH ART: 7.312 — AB (ref 7.350–7.450)
PH ART: 7.341 — AB (ref 7.350–7.450)
PO2 ART: 108 mmHg (ref 83.0–108.0)
PO2 ART: 116 mmHg — AB (ref 83.0–108.0)
Patient temperature: 37.3
Patient temperature: 37.3
TCO2: 22 mmol/L (ref 22–32)
TCO2: 22 mmol/L (ref 22–32)

## 2016-11-27 LAB — BASIC METABOLIC PANEL
ANION GAP: 6 (ref 5–15)
BUN: 6 mg/dL (ref 6–20)
CALCIUM: 7.4 mg/dL — AB (ref 8.9–10.3)
CO2: 20 mmol/L — AB (ref 22–32)
CREATININE: 0.63 mg/dL (ref 0.44–1.00)
Chloride: 111 mmol/L (ref 101–111)
GLUCOSE: 107 mg/dL — AB (ref 65–99)
Potassium: 4.1 mmol/L (ref 3.5–5.1)
Sodium: 137 mmol/L (ref 135–145)

## 2016-11-27 LAB — CREATININE, SERUM
Creatinine, Ser: 0.71 mg/dL (ref 0.44–1.00)
GFR calc Af Amer: >60 mL/min (ref >60)

## 2016-11-27 LAB — COOXEMETRY PANEL
Carboxyhemoglobin: 0.9 % (ref 0.5–1.5)
Carboxyhemoglobin: 1.6 % — ABNORMAL HIGH (ref 0.5–1.5)
Methemoglobin: 1.3 % (ref 0.0–1.5)
Methemoglobin: 0.9 % (ref 0.0–1.5)
O2 Saturation: 61.9 %
O2 Saturation: 60 %
Total hemoglobin: 8.8 g/dL — ABNORMAL LOW (ref 12.0–16.0)
Total hemoglobin: 10 g/dL — ABNORMAL LOW (ref 12.0–16.0)

## 2016-11-27 LAB — MAGNESIUM
MAGNESIUM: 2.5 mg/dL — AB (ref 1.7–2.4)
Magnesium: 2.8 mg/dL — ABNORMAL HIGH (ref 1.7–2.4)

## 2016-11-27 MED ORDER — ENOXAPARIN SODIUM 40 MG/0.4ML ~~LOC~~ SOLN
40.0000 mg | Freq: Every day | SUBCUTANEOUS | Status: DC
  Administered 2016-11-27 – 2016-12-01 (×5): 40 mg via SUBCUTANEOUS
  Filled 2016-11-27 (×5): qty 0.4

## 2016-11-27 MED ORDER — FUROSEMIDE 10 MG/ML IJ SOLN
40.0000 mg | Freq: Once | INTRAMUSCULAR | Status: AC
  Administered 2016-11-27: 40 mg via INTRAVENOUS

## 2016-11-27 MED ORDER — ORAL CARE MOUTH RINSE
15.0000 mL | Freq: Two times a day (BID) | OROMUCOSAL | Status: DC
  Administered 2016-11-27 – 2016-12-01 (×6): 15 mL via OROMUCOSAL

## 2016-11-27 MED ORDER — INSULIN DETEMIR 100 UNIT/ML ~~LOC~~ SOLN
10.0000 [IU] | Freq: Every day | SUBCUTANEOUS | Status: DC
  Administered 2016-11-27 – 2016-11-30 (×4): 10 [IU] via SUBCUTANEOUS
  Filled 2016-11-27 (×5): qty 0.1

## 2016-11-27 MED ORDER — INSULIN ASPART 100 UNIT/ML ~~LOC~~ SOLN
0.0000 [IU] | SUBCUTANEOUS | Status: DC
  Administered 2016-11-27: 2 [IU] via SUBCUTANEOUS
  Administered 2016-11-28: 4 [IU] via SUBCUTANEOUS
  Administered 2016-11-28 (×4): 2 [IU] via SUBCUTANEOUS

## 2016-11-27 NOTE — Progress Notes (Signed)
NIF -20, VC .9L

## 2016-11-27 NOTE — Procedures (Signed)
Extubation Procedure Note  Patient Details:   Name: LAVELL RIDINGS DOB: [birth year] MRN: [id number]   Airway Documentation:     Evaluation  O2 sats: stable throughout Complications: No apparent complications Patient did tolerate procedure well. Bilateral Breath Sounds: Clear, Diminished   Yes   Patient extubated to [facility] without any complications. Patient is able to speak and has an adequate cough.  Blanchie Serve 11/27/2016, 12:06 AM

## 2016-11-27 NOTE — Progress Notes (Signed)
TCTS DAILY ICU PROGRESS NOTE                   [address]            [place] [address]          [phone]   1 Day Post-Op Procedure(s) (LRB): CORONARY ARTERY BYPASS GRAFTING (CABG), ON PUMP, TIMES FOUR, USING LEFT INTERNAL MAMMARY ARTERY AND ENDOSCOPICALLY HARVESTED RIGHT GREATER SAPHENOUS VEIN (N/A) TRANSESOPHAGEAL ECHOCARDIOGRAM (TEE) (N/A)  Total Length of Stay:  LOS: 5 days   Subjective: Patient neurologically intact, somewhat somnolent medication, says she cannot take a deep breath    objective: Vital signs in last 24 hours: Temp:  [94.5 F (34.7 C)-99.1 F (37.3 C)] 98.8 F (37.1 C) (10/27 0830) Pulse Rate:  [88-117] 102 (10/27 0830) Cardiac Rhythm: Sinus tachycardia (10/27 0800) Resp:  [12-37] 25 (10/27 0830) BP: (93-122)/(59-86) 98/77 (10/27 0000) SpO2:  [93 %-100 %] 97 % (10/27 0830) Arterial Line BP: (76-133)/(49-89) 103/58 (10/27 0830) FiO2 (%):  [40 %-50 %] 40 % (10/27 0000) Weight:  [233 lb 0.4 oz (105.7 kg)] 233 lb 0.4 oz (105.7 kg) (10/27 0500)  Filed Weights   11/25/16 0525 11/26/16 0527 11/27/16 0500  Weight: 220 lb 4.8 oz (99.9 kg) 216 lb 4.8 oz (98.1 kg) 233 lb 0.4 oz (105.7 kg)    Weight change: 16 lb 11.6 oz (7.587 kg)   Hemodynamic parameters for last 24 hours: PAP: (13-56)/(8-40) 30/15 CO:  [2.3 L/min-3.9 L/min] 3.9 L/min CI:  [1.2 L/min/m2-2 L/min/m2] 2 L/min/m2  Intake/Output from previous day: 10/26 0701 - 10/27 0700 In: 8039.6 [P.O.:50; I.V.:5209.6; Blood:780; IV Piggyback:2000] Out: 3800 [Urine:3120; Chest Tube:680]  Intake/Output this shift: Total I/O In: 61.8 [I.V.:61.8] Out: -   Current Meds: Scheduled Meds: . acetaminophen  1,000 mg Oral Q6H   Or  . acetaminophen (TYLENOL) oral liquid 160 mg/5 mL  1,000 mg Per Tube Q6H  . aspirin EC  325 mg Oral Daily   Or  . aspirin  324 mg Per Tube Daily  . atorvastatin  80 mg Oral q1800  . bisacodyl  10 mg Oral Daily   Or  . bisacodyl  10 mg Rectal Daily  .  docusate sodium  200 mg Oral Daily  . insulin regular  0-10 Units Intravenous TID WC  . levothyroxine  100 mcg Oral QAC breakfast  . mouth rinse  15 mL Mouth Rinse BID  . metoprolol tartrate  12.5 mg Oral BID   Or  . metoprolol tartrate  12.5 mg Per Tube BID  . pantoprazole  40 mg Oral Daily  . sodium chloride flush  3 mL Intravenous Q12H   Continuous Infusions: . sodium chloride 20 mL/hr at 11/27/16 0700  . sodium chloride    . sodium chloride 20 mL/hr at 11/27/16 0700  . cefUROXime (ZINACEF)  IV Stopped (11/26/16 2041)  . dexmedetomidine (PRECEDEX) IV infusion Stopped (11/27/16 0500)  . insulin (NOVOLIN-R) infusion 0.7 Units/hr (11/27/16 0700)  . lactated ringers    . lactated ringers 20 mL/hr at 11/27/16 0700  . milrinone 0.4 mcg/kg/min (11/27/16 0844)  . nitroGLYCERIN Stopped (11/26/16 1900)  . phenylephrine (NEO-SYNEPHRINE) Adult infusion 40 mcg/min (11/27/16 0845)   PRN Meds:.sodium chloride, metoprolol tartrate, midazolam, morphine injection, ondansetron (ZOFRAN) IV, oxyCODONE, sodium chloride flush, traMADol  General appearance: alert and cooperative Neurologic: intact Heart: regular rate and rhythm, S1, S2 normal, no murmur, click, rub or gallop Lungs: diminished breath sounds bibasilar Abdomen: soft, non-tender; bowel sounds  normal; no masses,  no organomegaly Extremities: extremities normal, atraumatic, no cyanosis or edema and Homans sign is negative, no sign of DVT Wound: Neuro intact  Lab Results: CBC: Recent Labs  11/26/16 1814 11/26/16 1825 11/27/16 0356  WBC 10.8*  --  9.3  HGB 9.5* 9.2* 9.7*  HCT 29.1* 27.0* 29.9*  PLT 153  --  203   BMET:  Recent Labs  11/26/16 0416  11/26/16 1825 11/27/16 0356  NA 135  < > 144 137  K 3.9  < > 4.6 4.1  CL 104  < > 112* 111  CO2 21*  --   --  20*  GLUCOSE 236*  < > 113* 107*  BUN 12  < > 7 6  CREATININE 0.81  < > 0.60 0.63  CALCIUM 9.3  --   --  7.4*  < > = values in this interval not displayed.   CMET: Lab Results  Component Value Date   WBC 9.3 11/27/2016   HGB 9.7 (L) 11/27/2016   HCT 29.9 (L) 11/27/2016   PLT 203 11/27/2016   GLUCOSE 107 (H) 11/27/2016   CHOL 132 11/23/2016   TRIG 103 11/23/2016   HDL 38 (L) 11/23/2016   LDLCALC 73 11/23/2016   ALT 23 11/24/2016   AST 56 (H) 11/24/2016   NA 137 11/27/2016   K 4.1 11/27/2016   CL 111 11/27/2016   CREATININE 0.63 11/27/2016   BUN 6 11/27/2016   CO2 20 (L) 11/27/2016   TSH 48.374 (H) 11/24/2016   INR 1.45 11/26/2016   HGBA1C 9.4 (H) 11/24/2016      PT/INR:  Recent Labs  11/26/16 1321  LABPROT 17.5*  INR 1.45   Radiology: Dg Chest Port 1 View  Result Date: 11/27/2016 CLINICAL DATA:  Atelectasis. EXAM: PORTABLE CHEST 1 VIEW COMPARISON:  11/26/2016 FINDINGS: Endotracheal tube and enteric tube have been removed. Right jugular Swan-Ganz catheter remains in place and terminates over the main pulmonary artery. Mediastinal and left chest tubes remain in place. The cardiomediastinal silhouette is unchanged. Lung volumes are diminished, more so than on the prior study, and there is mildly increased pulmonary vascular congestion with increased opacity in both lung bases and suspected small bilateral pleural effusions. No pneumothorax is identified. IMPRESSION: 1. Interval extubation. 2. Low lung volumes with increased pulmonary vascular congestion, worsening bibasilar aeration, and small pleural effusions. Electronically Signed   By: Logan Bores M.D.   On: 11/27/2016 07:29   Dg Chest Port 1 View  Result Date: 11/26/2016 CLINICAL DATA:  Status post CABG. EXAM: PORTABLE CHEST 1 VIEW COMPARISON:  11/22/2016. FINDINGS: Enlarged cardiomediastinal silhouette. Endotracheal tube 2.7 cm above carina. Swan-Ganz catheter tip main pulmonary artery. Chest tube and mediastinal tube good position. Orogastric tube good position. Mild vascular congestion. No pneumothorax. IMPRESSION: Satisfactory postoperative appearance. Electronically  Signed   By: Staci Righter M.D.   On: 11/26/2016 14:34     Assessment/Plan: S/P Procedure(s) (LRB): CORONARY ARTERY BYPASS GRAFTING (CABG), ON PUMP, TIMES FOUR, USING LEFT INTERNAL MAMMARY ARTERY AND ENDOSCOPICALLY HARVESTED RIGHT GREATER SAPHENOUS VEIN (N/A) TRANSESOPHAGEAL ECHOCARDIOGRAM (TEE) (N/A) Mobilize Diuresis d/c tubes/lines See progression orders Index of 2.0 addition, will wean as tolerated Expected Acute  Blood - loss Anemia- continue to monitor  TSH elevated to 45 preoperatively Creatinine stable, urine output good  [name] 11/27/2016 8:58 AM Patient ID: [name] [name], female   DOB: 1970/04/22, 46 y.o.   MRN: [id number]

## 2016-11-27 NOTE — Progress Notes (Signed)
Patient ID: [name] [name], female   DOB: 1970-10-20, 46 y.o.   MRN: [id number] EVENING ROUNDS NOTE :     [address]       [place] [address]             [phone]                 1 Day Post-Op Procedure(s) (LRB): CORONARY ARTERY BYPASS GRAFTING (CABG), ON PUMP, TIMES FOUR, USING LEFT INTERNAL MAMMARY ARTERY AND ENDOSCOPICALLY HARVESTED RIGHT GREATER SAPHENOUS VEIN (N/A) TRANSESOPHAGEAL ECHOCARDIOGRAM (TEE) (N/A)  Total Length of Stay:  LOS: 5 days  BP 98/77 (BP Location: Right Arm)   Pulse (!) 109   Temp 97.6 F (36.4 C) (Oral)   Resp (!) 30   Ht 5' 1"  (1.549 m)   Wt 233 lb 0.4 oz (105.7 kg)   SpO2 98%   BMI 44.03 kg/m   .Intake/Output      10/26 0701 - 10/27 0700 10/27 0701 - 10/28 0700   P.O. 50    I.V. (mL/kg) 5209.6 (49.3) 711.3 (6.7)   Blood 780    IV Piggyback 2000 50   Total Intake(mL/kg) 8039.6 (76.1) 761.3 (7.2)   Urine (mL/kg/hr) 3120 (1.2) 175 (0.1)   Chest Tube 680    Total Output 3800 175   Net +4239.6 +586.3          . sodium chloride Stopped (11/27/16 1400)  . sodium chloride    . sodium chloride Stopped (11/27/16 1400)  . cefUROXime (ZINACEF)  IV Stopped (11/27/16 0930)  . dexmedetomidine (PRECEDEX) IV infusion Stopped (11/27/16 0500)  . insulin (NOVOLIN-R) infusion Stopped (11/27/16 1300)  . lactated ringers    . lactated ringers 20 mL/hr at 11/27/16 0700  . nitroGLYCERIN Stopped (11/26/16 1900)  . phenylephrine (NEO-SYNEPHRINE) Adult infusion 15 mcg/min (11/27/16 1400)     Lab Results  Component Value Date   WBC 12.2 (H) 11/27/2016   HGB 10.0 (L) 11/27/2016   HCT 31.1 (L) 11/27/2016   PLT 201 11/27/2016   GLUCOSE 107 (H) 11/27/2016   CHOL 132 11/23/2016   TRIG 103 11/23/2016   HDL 38 (L) 11/23/2016   LDLCALC 73 11/23/2016   ALT 23 11/24/2016   AST 56 (H) 11/24/2016   NA 137 11/27/2016   K 4.1 11/27/2016   CL 111 11/27/2016   CREATININE 0.71 11/27/2016   BUN 6 11/27/2016   CO2 20 (L) 11/27/2016   TSH 48.374  (H) 11/24/2016   INR 1.45 11/26/2016   HGBA1C 9.4 (H) 11/24/2016   More alert tonight Weaning milrinone off Diuresis  [name] MD  [facility] [phone] Office [phone] 11/27/2016 7:00 PM

## 2016-11-28 ENCOUNTER — Inpatient Hospital Stay (HOSPITAL_COMMUNITY): Payer: PRIVATE HEALTH INSURANCE

## 2016-11-28 LAB — COOXEMETRY PANEL
Carboxyhemoglobin: 1.5 % (ref 0.5–1.5)
Methemoglobin: 0.8 % (ref 0.0–1.5)
O2 Saturation: 62.3 %
Total hemoglobin: 10.2 g/dL — ABNORMAL LOW (ref 12.0–16.0)

## 2016-11-28 LAB — BASIC METABOLIC PANEL
Anion gap: 7 (ref 5–15)
BUN: 7 mg/dL (ref 6–20)
CO2: 24 mmol/L (ref 22–32)
Calcium: 7.5 mg/dL — ABNORMAL LOW (ref 8.9–10.3)
Chloride: 105 mmol/L (ref 101–111)
Creatinine, Ser: 0.7 mg/dL (ref 0.44–1.00)
GFR calc Af Amer: >60 mL/min (ref >60)
GFR calc non Af Amer: >60 mL/min (ref >60)
Glucose, Bld: 98 mg/dL (ref 65–99)
Potassium: 3.8 mmol/L (ref 3.5–5.1)
Sodium: 136 mmol/L (ref 135–145)

## 2016-11-28 LAB — POCT I-STAT, CHEM 8
BUN: 8 mg/dL (ref 6–20)
CALCIUM ION: 1.06 mmol/L — AB (ref 1.15–1.40)
CHLORIDE: 107 mmol/L (ref 101–111)
CREATININE: 0.5 mg/dL (ref 0.44–1.00)
Glucose, Bld: 167 mg/dL — ABNORMAL HIGH (ref 65–99)
HCT: 32 % — ABNORMAL LOW (ref 36.0–46.0)
Hemoglobin: 10.9 g/dL — ABNORMAL LOW (ref 12.0–15.0)
Potassium: 3.8 mmol/L (ref 3.5–5.1)
SODIUM: 139 mmol/L (ref 135–145)
TCO2: 20 mmol/L — AB (ref 22–32)

## 2016-11-28 LAB — GLUCOSE, CAPILLARY
GLUCOSE-CAPILLARY: 131 mg/dL — AB (ref 65–99)
GLUCOSE-CAPILLARY: 134 mg/dL — AB (ref 65–99)
GLUCOSE-CAPILLARY: 77 mg/dL (ref 65–99)
Glucose-Capillary: 148 mg/dL — ABNORMAL HIGH (ref 65–99)
Glucose-Capillary: 95 mg/dL (ref 65–99)
Glucose-Capillary: 134 mg/dL — ABNORMAL HIGH (ref 65–99)
Glucose-Capillary: 178 mg/dL — ABNORMAL HIGH (ref 65–99)
Glucose-Capillary: 132 mg/dL — ABNORMAL HIGH (ref 65–99)

## 2016-11-28 LAB — CBC
HCT: 30.2 % — ABNORMAL LOW (ref 36.0–46.0)
Hemoglobin: 9.9 g/dL — ABNORMAL LOW (ref 12.0–15.0)
MCH: 29.8 pg (ref 26.0–34.0)
MCHC: 32.8 g/dL (ref 30.0–36.0)
MCV: 91 fL (ref 78.0–100.0)
Platelets: 208 10*3/uL (ref 150–400)
RBC: 3.32 MIL/uL — ABNORMAL LOW (ref 3.87–5.11)
RDW: 15.7 % — ABNORMAL HIGH (ref 11.5–15.5)
WBC: 12.2 10*3/uL — ABNORMAL HIGH (ref 4.0–10.5)

## 2016-11-28 MED ORDER — FUROSEMIDE 10 MG/ML IJ SOLN
40.0000 mg | Freq: Two times a day (BID) | INTRAMUSCULAR | Status: AC
  Administered 2016-11-28 (×2): 40 mg via INTRAVENOUS
  Filled 2016-11-28 (×2): qty 4

## 2016-11-28 MED ORDER — OMEPRAZOLE 20 MG PO CPDR
40.0000 mg | DELAYED_RELEASE_CAPSULE | Freq: Every day | ORAL | Status: DC
  Administered 2016-11-28: 40 mg via ORAL
  Filled 2016-11-28 (×2): qty 2

## 2016-11-28 NOTE — Progress Notes (Signed)
Patient ID: [name] [name], female   DOB: 1970-06-01, 46 y.o.   MRN: [id number] TCTS DAILY ICU PROGRESS NOTE                   [address]            ,[place] [address]          [phone]   2 Days Post-Op Procedure(s) (LRB): CORONARY ARTERY BYPASS GRAFTING (CABG), ON PUMP, TIMES FOUR, USING LEFT INTERNAL MAMMARY ARTERY AND ENDOSCOPICALLY HARVESTED RIGHT GREATER SAPHENOUS VEIN (N/A) TRANSESOPHAGEAL ECHOCARDIOGRAM (TEE) (N/A)  Total Length of Stay:  LOS: 6 days   Subjective: Patient feels some better today, up in chair, did walk a short distance this morning but very slow in getting mobilized  Objective: Vital signs in last 24 hours: Temp:  [97.6 F (36.4 C)-99.5 F (37.5 C)] 98.1 F (36.7 C) (10/28 0755) Pulse Rate:  [94-112] 98 (10/28 0500) Cardiac Rhythm: Normal sinus rhythm (10/28 0400) Resp:  [0-35] 22 (10/28 0600) BP: (99-119)/(68-79) 110/72 (10/28 0500) SpO2:  [93 %-99 %] 96 % (10/28 0500) Arterial Line BP: (100-131)/(57-68) 124/62 (10/27 1745) Weight:  [231 lb 11.3 oz (105.1 kg)] 231 lb 11.3 oz (105.1 kg) (10/28 0500)  Filed Weights   11/26/16 0527 11/27/16 0500 11/28/16 0500  Weight: 216 lb 4.8 oz (98.1 kg) 233 lb 0.4 oz (105.7 kg) 231 lb 11.3 oz (105.1 kg)    Weight change: -1 lb 5.2 oz (-0.6 kg)   Wt Readings from Last 3 Encounters:  11/28/16 231 lb 11.3 oz (105.1 kg)  12/24/15 212 lb (96.2 kg)  12/12/15 212 lb (96.2 kg)     Hemodynamic parameters for last 24 hours: PAP: (29-39)/(14-23) 34/17  Intake/Output from previous day: 10/27 0701 - 10/28 0700 In: 1517.3 [P.O.:360; I.V.:1057.3; IV Piggyback:100] Out: 7902 [Urine:1275; Chest Tube:220]  Intake/Output this shift: No intake/output data recorded.  Current Meds: Scheduled Meds: . acetaminophen  1,000 mg Oral Q6H   Or  . acetaminophen (TYLENOL) oral liquid 160 mg/5 mL  1,000 mg Per Tube Q6H  . aspirin EC  325 mg Oral Daily   Or  . aspirin  324 mg Per Tube Daily  .  atorvastatin  80 mg Oral q1800  . bisacodyl  10 mg Oral Daily   Or  . bisacodyl  10 mg Rectal Daily  . docusate sodium  200 mg Oral Daily  . enoxaparin (LOVENOX) injection  40 mg Subcutaneous QHS  . insulin aspart  0-24 Units Subcutaneous Q4H  . insulin detemir  10 Units Subcutaneous Daily  . insulin regular  0-10 Units Intravenous TID WC  . levothyroxine  100 mcg Oral QAC breakfast  . mouth rinse  15 mL Mouth Rinse BID  . metoprolol tartrate  12.5 mg Oral BID   Or  . metoprolol tartrate  12.5 mg Per Tube BID  . pantoprazole  40 mg Oral Daily  . sodium chloride flush  3 mL Intravenous Q12H   Continuous Infusions: . sodium chloride Stopped (11/27/16 1400)  . sodium chloride    . sodium chloride Stopped (11/27/16 1400)  . cefUROXime (ZINACEF)  IV 1.5 g (11/28/16 0805)  . dexmedetomidine (PRECEDEX) IV infusion Stopped (11/27/16 0500)  . insulin (NOVOLIN-R) infusion Stopped (11/27/16 1300)  . lactated ringers    . lactated ringers 20 mL/hr at 11/28/16 0809  . nitroGLYCERIN Stopped (11/26/16 1900)  . phenylephrine (NEO-SYNEPHRINE) Adult infusion Stopped (11/27/16 1700)   PRN Meds:.sodium chloride, metoprolol tartrate, midazolam, morphine injection,  ondansetron (ZOFRAN) IV, oxyCODONE, sodium chloride flush, traMADol  General appearance: alert and cooperative Neurologic: intact Heart: regular rate and rhythm, S1, S2 normal, no murmur, click, rub or gallop Lungs: diminished breath sounds bibasilar Abdomen: soft, non-tender; bowel sounds normal; no masses,  no organomegaly Extremities: edema Lower extremity edema bilaterally Wound: Sternum intact  Lab Results: CBC: Recent Labs  11/27/16 1729 11/28/16 0424  WBC 12.2* 12.2*  HGB 10.0* 9.9*  HCT 31.1* 30.2*  PLT 201 208   BMET:  Recent Labs  11/27/16 0356 11/27/16 1729 11/28/16 0424  NA 137  --  136  K 4.1  --  3.8  CL 111  --  105  CO2 20*  --  24  GLUCOSE 107*  --  98  BUN 6  --  7  CREATININE 0.63 0.71 0.70    CALCIUM 7.4*  --  7.5*    CMET: Lab Results  Component Value Date   WBC 12.2 (H) 11/28/2016   HGB 9.9 (L) 11/28/2016   HCT 30.2 (L) 11/28/2016   PLT 208 11/28/2016   GLUCOSE 98 11/28/2016   CHOL 132 11/23/2016   TRIG 103 11/23/2016   HDL 38 (L) 11/23/2016   LDLCALC 73 11/23/2016   ALT 23 11/24/2016   AST 56 (H) 11/24/2016   NA 136 11/28/2016   K 3.8 11/28/2016   CL 105 11/28/2016   CREATININE 0.70 11/28/2016   BUN 7 11/28/2016   CO2 24 11/28/2016   TSH 48.374 (H) 11/24/2016   INR 1.45 11/26/2016   HGBA1C 9.4 (H) 11/24/2016      PT/INR:  Recent Labs  11/26/16 1321  LABPROT 17.5*  INR 1.45   Radiology: Dg Chest Port 1 View  Result Date: 11/28/2016 CLINICAL DATA:  Postop check heart EXAM: PORTABLE CHEST 1 VIEW COMPARISON:  11/27/2016 FINDINGS: Swan-Ganz catheter has been removed and right IJ sheath remains in place. A mediastinal drain and the lower left-sided chest tube has been removed. The upper chest tube remains in place. Heart is enlarged. There are bibasilar opacities bilateral pleural effusions, stable in appearance. No pneumothorax. Pulmonary vascular congestion persists but aeration appears slightly improved at the apices. IMPRESSION: 1. Removal of Swan-Ganz catheter, mediastinal drain, and lower left chest tube. 2. Slightly improved aeration at the apices. Persistent opacity at the bases. Electronically Signed   By: Nolon Nations M.D.   On: 11/28/2016 07:18     Assessment/Plan: S/P Procedure(s) (LRB): CORONARY ARTERY BYPASS GRAFTING (CABG), ON PUMP, TIMES FOUR, USING LEFT INTERNAL MAMMARY ARTERY AND ENDOSCOPICALLY HARVESTED RIGHT GREATER SAPHENOUS VEIN (N/A) TRANSESOPHAGEAL ECHOCARDIOGRAM (TEE) (N/A) Mobilize Diuresis Diabetes control  On Lovenox for DVT prophylaxis Weight still up, chest x-ray with bilateral pleural effusions-continue to diurese as tolerated Creatinine stable Expected Acute  Blood - loss Anemia- continue to monitor  DC remaining  chest tube Patient has a hard IV stick, will leave central line in 1 more day  [name] [name] 11/28/2016 8:32 AM

## 2016-11-28 NOTE — Progress Notes (Addendum)
patient ID: CASILDA PICKERILL, female   DOB: 05/16/1970, 46 y.o.   MRN: [id number] EVENING ROUNDS NOTE :     [address]       [place] [address]             [phone]                 2 Days Post-Op Procedure(s) (LRB): CORONARY ARTERY BYPASS GRAFTING (CABG), ON PUMP, TIMES FOUR, USING LEFT INTERNAL MAMMARY ARTERY AND ENDOSCOPICALLY HARVESTED RIGHT GREATER SAPHENOUS VEIN (N/A) TRANSESOPHAGEAL ECHOCARDIOGRAM (TEE) (N/A)  Total Length of Stay:  LOS: 6 days  BP 113/68   Pulse 98   Temp 98.4 F (36.9 C) (Oral)   Resp (!) 22   Ht 5' 1"  (1.549 m)   Wt 231 lb 11.3 oz (105.1 kg)   SpO2 96%   BMI 43.78 kg/m   .Intake/Output      10/28 0701 - 10/29 0700   I.V. (mL/kg) 240 (2.3)   Total Intake(mL/kg) 240 (2.3)   Urine (mL/kg/hr) 1375 (1.1)   Chest Tube 160   Total Output 1535   Net -1295         . sodium chloride    . lactated ringers    . lactated ringers 20 mL/hr at 11/28/16 0809     Lab Results  Component Value Date   WBC 12.2 (H) 11/28/2016   HGB 9.9 (L) 11/28/2016   HCT 30.2 (L) 11/28/2016   PLT 208 11/28/2016   GLUCOSE 98 11/28/2016   CHOL 132 11/23/2016   TRIG 103 11/23/2016   HDL 38 (L) 11/23/2016   LDLCALC 73 11/23/2016   ALT 23 11/24/2016   AST 56 (H) 11/24/2016   NA 136 11/28/2016   K 3.8 11/28/2016   CL 105 11/28/2016   CREATININE 0.70 11/28/2016   BUN 7 11/28/2016   CO2 24 11/28/2016   TSH 48.374 (H) 11/24/2016   INR 1.45 11/26/2016   HGBA1C 9.4 (H) 11/24/2016   Feels better this evening has been up ambulating All drips are off Put out 1600 mL urine output this early  today , dose of lasix now then foley out later  [name] MD  [place] Office [phone] 11/28/2016 7:22 PM

## 2016-11-29 ENCOUNTER — Encounter (HOSPITAL_COMMUNITY): Payer: Self-pay | Admitting: Thoracic Surgery (Cardiothoracic Vascular Surgery)

## 2016-11-29 ENCOUNTER — Inpatient Hospital Stay (HOSPITAL_COMMUNITY): Payer: PRIVATE HEALTH INSURANCE

## 2016-11-29 LAB — BASIC METABOLIC PANEL
Anion gap: 8 (ref 5–15)
BUN: 7 mg/dL (ref 6–20)
CO2: 26 mmol/L (ref 22–32)
Calcium: 7.7 mg/dL — ABNORMAL LOW (ref 8.9–10.3)
Chloride: 101 mmol/L (ref 101–111)
Creatinine, Ser: 0.72 mg/dL (ref 0.44–1.00)
GFR calc Af Amer: >60 mL/min (ref >60)
GFR calc non Af Amer: >60 mL/min (ref >60)
Glucose, Bld: 88 mg/dL (ref 65–99)
Potassium: 3.4 mmol/L — ABNORMAL LOW (ref 3.5–5.1)
Sodium: 135 mmol/L (ref 135–145)

## 2016-11-29 LAB — BPAM RBC
BLOOD PRODUCT EXPIRATION DATE: 201811092359
BLOOD PRODUCT EXPIRATION DATE: 201811112359
Blood Product Expiration Date: 201811092359
Blood Product Expiration Date: 201811112359
ISSUE DATE / TIME: 201810260802
ISSUE DATE / TIME: 201810261420
ISSUE DATE / TIME: 201810260802
ISSUE DATE / TIME: 201810261544
UNIT TYPE AND RH: 6200
UNIT TYPE AND RH: 6200
Unit Type and Rh: 6200
Unit Type and Rh: 6200

## 2016-11-29 LAB — TYPE AND SCREEN
ABO/RH(D): A POS
ANTIBODY SCREEN: NEGATIVE
UNIT DIVISION: 0
Unit division: 0
Unit division: 0
Unit division: 0

## 2016-11-29 LAB — COOXEMETRY PANEL
Carboxyhemoglobin: 1.7 % — ABNORMAL HIGH (ref 0.5–1.5)
Methemoglobin: 0.9 % (ref 0.0–1.5)
O2 Saturation: 76.8 %
Total hemoglobin: 9.8 g/dL — ABNORMAL LOW (ref 12.0–16.0)

## 2016-11-29 LAB — CBC
HCT: 30.1 % — ABNORMAL LOW (ref 36.0–46.0)
Hemoglobin: 9.9 g/dL — ABNORMAL LOW (ref 12.0–15.0)
MCH: 30.2 pg (ref 26.0–34.0)
MCHC: 32.9 g/dL (ref 30.0–36.0)
MCV: 91.8 fL (ref 78.0–100.0)
Platelets: 218 10*3/uL (ref 150–400)
RBC: 3.28 MIL/uL — ABNORMAL LOW (ref 3.87–5.11)
RDW: 15.7 % — ABNORMAL HIGH (ref 11.5–15.5)
WBC: 11.6 10*3/uL — ABNORMAL HIGH (ref 4.0–10.5)

## 2016-11-29 LAB — GLUCOSE, CAPILLARY
GLUCOSE-CAPILLARY: 70 mg/dL (ref 65–99)
GLUCOSE-CAPILLARY: 153 mg/dL — AB (ref 65–99)
GLUCOSE-CAPILLARY: 127 mg/dL — AB (ref 65–99)
GLUCOSE-CAPILLARY: 175 mg/dL — AB (ref 65–99)
Glucose-Capillary: 129 mg/dL — ABNORMAL HIGH (ref 65–99)
Glucose-Capillary: 178 mg/dL — ABNORMAL HIGH (ref 65–99)

## 2016-11-29 MED ORDER — FUROSEMIDE 40 MG PO TABS
40.0000 mg | ORAL_TABLET | Freq: Two times a day (BID) | ORAL | Status: DC
  Administered 2016-11-29 – 2016-11-30 (×3): 40 mg via ORAL
  Filled 2016-11-29 (×6): qty 1

## 2016-11-29 MED ORDER — MOVING RIGHT ALONG BOOK
Freq: Once | Status: AC
  Administered 2016-11-29: 08:00:00
  Filled 2016-11-29: qty 1

## 2016-11-29 MED ORDER — SUMATRIPTAN SUCCINATE 50 MG PO TABS
50.0000 mg | ORAL_TABLET | Freq: Two times a day (BID) | ORAL | Status: DC | PRN
  Filled 2016-11-29: qty 2

## 2016-11-29 MED ORDER — POTASSIUM CHLORIDE CRYS ER 20 MEQ PO TBCR
20.0000 meq | EXTENDED_RELEASE_TABLET | Freq: Two times a day (BID) | ORAL | Status: DC
  Administered 2016-11-30 – 2016-12-02 (×5): 20 meq via ORAL
  Filled 2016-11-29 (×5): qty 1

## 2016-11-29 MED ORDER — FOLIC ACID 1 MG PO TABS
1.0000 mg | ORAL_TABLET | Freq: Every day | ORAL | Status: DC
  Administered 2016-11-29 – 2016-12-02 (×4): 1 mg via ORAL
  Filled 2016-11-29 (×4): qty 1

## 2016-11-29 MED ORDER — METOPROLOL TARTRATE 25 MG PO TABS
25.0000 mg | ORAL_TABLET | Freq: Two times a day (BID) | ORAL | Status: DC
  Administered 2016-11-29 – 2016-12-02 (×7): 25 mg via ORAL
  Filled 2016-11-29 (×7): qty 1

## 2016-11-29 MED ORDER — SODIUM CHLORIDE 0.9% FLUSH
3.0000 mL | Freq: Two times a day (BID) | INTRAVENOUS | Status: DC
  Administered 2016-11-29 – 2016-12-01 (×4): 3 mL via INTRAVENOUS

## 2016-11-29 MED ORDER — POTASSIUM CHLORIDE 10 MEQ/50ML IV SOLN
10.0000 meq | INTRAVENOUS | Status: DC
  Administered 2016-11-29: 10 meq via INTRAVENOUS
  Filled 2016-11-29: qty 50

## 2016-11-29 MED ORDER — SODIUM CHLORIDE 0.9% FLUSH: 3.0000 mL | INTRAVENOUS | Status: DC | PRN

## 2016-11-29 MED ORDER — PANTOPRAZOLE SODIUM 40 MG PO TBEC
80.0000 mg | DELAYED_RELEASE_TABLET | Freq: Every day | ORAL | Status: DC
  Administered 2016-11-29 – 2016-12-01 (×3): 80 mg via ORAL
  Filled 2016-11-29 (×3): qty 2

## 2016-11-29 MED ORDER — POTASSIUM CHLORIDE 10 MEQ/50ML IV SOLN
10.0000 meq | INTRAVENOUS | Status: AC
  Administered 2016-11-29 (×4): 10 meq via INTRAVENOUS
  Filled 2016-11-29 (×2): qty 50

## 2016-11-29 MED ORDER — ASPIRIN EC 81 MG PO TBEC
81.0000 mg | DELAYED_RELEASE_TABLET | Freq: Every day | ORAL | Status: DC
  Administered 2016-11-29 – 2016-12-02 (×4): 81 mg via ORAL
  Filled 2016-11-29 (×4): qty 1

## 2016-11-29 MED ORDER — POLYSACCHARIDE IRON COMPLEX 150 MG PO CAPS
150.0000 mg | ORAL_CAPSULE | Freq: Every day | ORAL | Status: DC
  Administered 2016-11-30 – 2016-12-02 (×3): 150 mg via ORAL
  Filled 2016-11-29 (×3): qty 1

## 2016-11-29 MED ORDER — CLOPIDOGREL BISULFATE 75 MG PO TABS
75.0000 mg | ORAL_TABLET | Freq: Every day | ORAL | Status: DC
  Administered 2016-11-29 – 2016-12-02 (×4): 75 mg via ORAL
  Filled 2016-11-29 (×4): qty 1

## 2016-11-29 MED ORDER — SODIUM CHLORIDE 0.9 % IV SOLN: 250.0000 mL | INTRAVENOUS | Status: DC | PRN

## 2016-11-29 MED ORDER — POTASSIUM CHLORIDE 10 MEQ/50ML IV SOLN
10.0000 meq | INTRAVENOUS | Status: DC
  Administered 2016-11-29: 10 meq via INTRAVENOUS
  Filled 2016-11-29 (×3): qty 50

## 2016-11-29 MED ORDER — INSULIN ASPART 100 UNIT/ML ~~LOC~~ SOLN
0.0000 [IU] | Freq: Three times a day (TID) | SUBCUTANEOUS | Status: DC
  Administered 2016-11-29: 4 [IU] via SUBCUTANEOUS
  Administered 2016-11-29 (×3): 2 [IU] via SUBCUTANEOUS
  Administered 2016-11-30: 8 [IU] via SUBCUTANEOUS
  Administered 2016-11-30: 4 [IU] via SUBCUTANEOUS
  Administered 2016-11-30: 2 [IU] via SUBCUTANEOUS
  Administered 2016-11-30 – 2016-12-01 (×2): 8 [IU] via SUBCUTANEOUS
  Administered 2016-12-01 (×2): 12 [IU] via SUBCUTANEOUS
  Administered 2016-12-02: 8 [IU] via SUBCUTANEOUS

## 2016-11-29 NOTE — Progress Notes (Signed)
TCTS BRIEF SICU PROGRESS NOTE  3 Days Post-Op  S/P Procedure(s) (LRB): CORONARY ARTERY BYPASS GRAFTING (CABG), ON PUMP, TIMES FOUR, USING LEFT INTERNAL MAMMARY ARTERY AND ENDOSCOPICALLY HARVESTED RIGHT GREATER SAPHENOUS VEIN (N/A) TRANSESOPHAGEAL ECHOCARDIOGRAM (TEE) (N/A)   Stable day  Plan: Awaiting bed for transfer  Rexene Alberts, MD 11/29/2016 6:20 PM

## 2016-11-29 NOTE — Progress Notes (Signed)
Progress Note  Patient Name: VENIE MONTESINOS Date of Encounter: 11/29/2016  Primary Cardiologist: Ellyn Hack  Subjective   Does not feel very well but no specific complaints today.  No shortness of breath.  Her daughter from [place] is at the bedside.  Inpatient Medications    Scheduled Meds: . acetaminophen  1,000 mg Oral Q6H  . aspirin EC  81 mg Oral Daily  . atorvastatin  80 mg Oral q1800  . bisacodyl  10 mg Oral Daily   Or  . bisacodyl  10 mg Rectal Daily  . clopidogrel  75 mg Oral Daily  . docusate sodium  200 mg Oral Daily  . enoxaparin (LOVENOX) injection  40 mg Subcutaneous QHS  . folic acid  1 mg Oral Daily  . furosemide  40 mg Oral BID  . insulin aspart  0-24 Units Subcutaneous TID AC & HS  . insulin detemir  10 Units Subcutaneous Daily  . [START ON 11/30/2016] iron polysaccharides  150 mg Oral Daily  . levothyroxine  100 mcg Oral QAC breakfast  . mouth rinse  15 mL Mouth Rinse BID  . metoprolol tartrate  25 mg Oral BID  . pantoprazole  80 mg Oral QHS  . [START ON 11/30/2016] potassium chloride  20 mEq Oral BID  . sodium chloride flush  3 mL Intravenous Q12H  . sodium chloride flush  3 mL Intravenous Q12H   Continuous Infusions: . sodium chloride    . sodium chloride    . lactated ringers    . lactated ringers 20 mL/hr at 11/28/16 1900   PRN Meds: sodium chloride, metoprolol tartrate, morphine injection, ondansetron (ZOFRAN) IV, oxyCODONE, sodium chloride flush, sodium chloride flush, SUMAtriptan, traMADol   Vital Signs    Vitals:   11/29/16 1000 11/29/16 1200 11/29/16 1202 11/29/16 1300  BP: 97/80 92/67 92/67  95/64  Pulse: (!) 101 94 95 92  Resp: 20 (!) 23 (!) 24 (!) 26  Temp:   98.4 F (36.9 C)   TempSrc:   Oral   SpO2: 100% 99% 100% 95%  Weight:      Height:        Intake/Output Summary (Last 24 hours) at 11/29/16 1409 Last data filed at 11/29/16 1220  Gross per 24 hour  Intake              660 ml  Output             2375 ml  Net             -1715 ml   Filed Weights   11/27/16 0500 11/28/16 0500 11/29/16 0436  Weight: 233 lb 0.4 oz (105.7 kg) 231 lb 11.3 oz (105.1 kg) 226 lb 1.6 oz (102.6 kg)    Telemetry    Sinus rhythm without arrhythmia- Personally Reviewed   Physical Exam  Alert, oriented woman, in no distress GEN: No acute distress.   Neck: No JVD Cardiac: RRR, 2/6 systolic ejection murmur at the right upper sternal border Respiratory: Clear to auscultation bilaterally. GI: Soft, nontender, non-distended  MS:  Diffuse bilateral 1+ edema; No deformity. Neuro:  Nonfocal  Psych: Normal affect   Labs    Chemistry Recent Labs Lab 11/24/16 0524  11/27/16 0356 11/27/16 1729 11/27/16 1851 11/28/16 0424 11/29/16 0407  NA 138  < > 137  --  139 136 135  K 3.2*  < > 4.1  --  3.8 3.8 3.4*  CL 107  < > 111  --  107 105  101  CO2 22  < > 20*  --   --  24 26  GLUCOSE 101*  < > 107*  --  167* 98 88  BUN 7  < > 6  --  8 7 7   CREATININE 0.70  < > 0.63 0.71 0.50 0.70 0.72  CALCIUM 8.4*  < > 7.4*  --   --  7.5* 7.7*  PROT 6.5  --   --   --   --   --   --   ALBUMIN 3.1*  --   --   --   --   --   --   AST 56*  --   --   --   --   --   --   ALT 23  --   --   --   --   --   --   ALKPHOS 99  --   --   --   --   --   --   BILITOT 0.7  --   --   --   --   --   --   GFRNONAA >60  < > >60 >60  --  >60 >60  GFRAA >60  < > >60 >60  --  >60 >60  ANIONGAP 9  < > 6  --   --  7 8  < > = values in this interval not displayed.   Hematology Recent Labs Lab 11/27/16 1729 11/27/16 1851 11/28/16 0424 11/29/16 0407  WBC 12.2*  --  12.2* 11.6*  RBC 3.44*  --  3.32* 3.28*  HGB 10.0* 10.9* 9.9* 9.9*  HCT 31.1* 32.0* 30.2* 30.1*  MCV 90.4  --  91.0 91.8  MCH 29.1  --  29.8 30.2  MCHC 32.2  --  32.8 32.9  RDW 15.7*  --  15.7* 15.7*  PLT 201  --  208 218    Cardiac Enzymes Recent Labs Lab 11/22/16 2206 11/23/16 0324 11/23/16 1131  TROPONINI 4.84* 5.91* 5.59*   No results for input(s): TROPIPOC in the last 168 hours.    BNPNo results for input(s): BNP, PROBNP in the last 168 hours.   DDimer No results for input(s): DDIMER in the last 168 hours.   Radiology    Dg Chest Port 1 View  Result Date: 11/29/2016 CLINICAL DATA:  Chest soreness after CABG.  Pleural effusions. EXAM: PORTABLE CHEST 1 VIEW COMPARISON:  11/28/2016 and 11/27/2016 FINDINGS: Chest tubes and Swan-Ganz catheter have been removed. Sheath remains in the SVC. Heart size and pulmonary vascularity are normal. There are small bilateral pleural effusions with slight bibasilar atelectasis. No pneumothorax. IMPRESSION: 1. No pneumothorax after chest tube removal. 2. Small bilateral pleural effusions and bibasilar atelectasis, slightly improved since the prior study. Electronically Signed   By: Lorriane Shire M.D.   On: 11/29/2016 07:34   Dg Chest Port 1 View  Result Date: 11/28/2016 CLINICAL DATA:  Postop check heart EXAM: PORTABLE CHEST 1 VIEW COMPARISON:  11/27/2016 FINDINGS: Swan-Ganz catheter has been removed and right IJ sheath remains in place. A mediastinal drain and the lower left-sided chest tube has been removed. The upper chest tube remains in place. Heart is enlarged. There are bibasilar opacities bilateral pleural effusions, stable in appearance. No pneumothorax. Pulmonary vascular congestion persists but aeration appears slightly improved at the apices. IMPRESSION: 1. Removal of Swan-Ganz catheter, mediastinal drain, and lower left chest tube. 2. Slightly improved aeration at the apices. Persistent opacity at the bases. Electronically Signed  By: Nolon Nations M.D.   On: 11/28/2016 07:18    Cardiac Studies   Cardiac catheterization 11/23/2016: Conclusion   1. Diffusely diseased RCA with total occlusion of the distal RCA. This is the patient's culprit vessel for non-STEMI with contrast staining at the occlusion. There are faint left to right collaterals present. 2. Severe proximal LAD stenosis, diffuse segment 3. Severe proximal  left circumflex stenosis 4. Mild segmental LV dysfunction with akinesis of the mid inferior wall and normal wall motion through the anterolateral and periapical walls.  Recommendation: We will review revascularization options. This is a diabetic patient with diffuse proximal coronary disease. At her young age, it would be reasonable to consider multivessel PCI. However, I am reluctant about obligating her to dual antiplatelet therapy for the next 12 months in the setting of ulcerative colitis. Will place a formal cardiac surgical consult for CABG and further discussion of best revascularization strategy. Will resume IV heparin after her TR band is out. Will hold on oral antiplatelet therapy other than aspirin in case she is ultimately treated with CABG.     Patient Profile     46 y.o. 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  Non-STEMI: RCA was the patient's culprit vessel with inferior akinesis on ventriculography.  Severe multivessel disease now status post surgical revascularization.  Continue dual antiplatelet therapy with aspirin and clopidogrel.  Aggressive treatment of diabetes and hyperlipidemia.  2.  Type 2 diabetes: Management per Dr. Roxy Manns.  3.  Hyperlipidemia: Continue high intensity statin drug.  The patient is on a good medical regimen that includes aspirin, clopidogrel, high intensity statin drug,.  Her blood pressure is too soft for an ACE/ARB but this could be considered as an outpatient after discharge.  She seems to be progressing very well after surgery, maintaining sinus rhythm.  Will arrange outpatient cardiology follow-up 2 weeks after hospital discharge with Dr. Ellyn Hack or his PA/NP.  For questions or updates, please contact [facility] Please consult [email or web address] for contact info under Cardiology/STEMI.      Deatra James, MD  11/29/2016, 2:09 PM

## 2016-11-29 NOTE — Progress Notes (Signed)
11/29/2016 4:47 PM EPW d/c per order and per protocol. Ends intact. Pt. Tolerated well. Advised BR x 1 HR. Call bell within reach. VS collected per protocol. Will continue to monitor patient.  Lister Brizzi, Arville Lime

## 2016-11-29 NOTE — Progress Notes (Signed)
[address]       ,[place] [address]             [phone]        CARDIOTHORACIC SURGERY PROGRESS NOTE   R3 Days Post-Op Procedure(s) (LRB): CORONARY ARTERY BYPASS GRAFTING (CABG), ON PUMP, TIMES FOUR, USING LEFT INTERNAL MAMMARY ARTERY AND ENDOSCOPICALLY HARVESTED RIGHT GREATER SAPHENOUS VEIN (N/A) TRANSESOPHAGEAL ECHOCARDIOGRAM (TEE) (N/A)  Subjective: No specific complaints.  Minimal pain.  No SOB.  Poor appetite but no abdominal pain.  Objective: Vital signs: BP Readings from Last 1 Encounters:  11/29/16 104/64   Pulse Readings from Last 1 Encounters:  11/29/16 (!) 102   Resp Readings from Last 1 Encounters:  11/29/16 13   Temp Readings from Last 1 Encounters:  11/29/16 99 F (37.2 C) (Oral)    Hemodynamics:   Mixed venous co-ox 77%   Physical Exam:  Rhythm:   sinus  Breath sounds: Diminished at bases  Heart sounds:  RRR  Incisions:  Dressing dry, intact  Abdomen:  Soft, non-distended, non-tender  Extremities:  Warm, well-perfused    Intake/Output from previous day: 10/28 0701 - 10/29 0700 In: 450 [I.V.:450] Out: 3075 [Urine:2915; Chest Tube:160] Intake/Output this shift: No intake/output data recorded.  Lab Results:  CBC: Recent Labs  11/28/16 0424 11/29/16 0407  WBC 12.2* 11.6*  HGB 9.9* 9.9*  HCT 30.2* 30.1*  PLT 208 218    BMET:  Recent Labs  11/28/16 0424 11/29/16 0407  NA 136 135  K 3.8 3.4*  CL 105 101  CO2 24 26  GLUCOSE 98 88  BUN 7 7  CREATININE 0.70 0.72  CALCIUM 7.5* 7.7*     PT/INR:   Recent Labs  11/26/16 1321  LABPROT 17.5*  INR 1.45    CBG (last 3)   Recent Labs  11/28/16 2012 11/28/16 2347 11/29/16 0419  GLUCAP 132* 77 70    ABG    Component Value Date/Time   PHART 7.312 (L) 11/27/2016 0104   PCO2ART 40.4 11/27/2016 0104   PO2ART 116.0 (H) 11/27/2016 0104   HCO3 20.4 11/27/2016 0104   TCO2 20 (L) 11/27/2016 1851   ACIDBASEDEF 5.0 (H) 11/27/2016 0104   O2SAT 76.8  11/29/2016 0410    CXR: PORTABLE CHEST 1 VIEW  COMPARISON:  11/28/2016 and 11/27/2016  FINDINGS: Chest tubes and Swan-Ganz catheter have been removed. Sheath remains in the SVC.  Heart size and pulmonary vascularity are normal.  There are small bilateral pleural effusions with slight bibasilar atelectasis. No pneumothorax.  IMPRESSION: 1. No pneumothorax after chest tube removal. 2. Small bilateral pleural effusions and bibasilar atelectasis, slightly improved since the prior study.   Electronically Signed   By: Lorriane Shire M.D.   On: 11/29/2016 07:34  Assessment/Plan: S/P Procedure(s) (LRB): CORONARY ARTERY BYPASS GRAFTING (CABG), ON PUMP, TIMES FOUR, USING LEFT INTERNAL MAMMARY ARTERY AND ENDOSCOPICALLY HARVESTED RIGHT GREATER SAPHENOUS VEIN (N/A) TRANSESOPHAGEAL ECHOCARDIOGRAM (TEE) (N/A)  Overall stable POD3 Maintaining NSR - sinus tach w/ stable BP, mixed venous co-ox high Breathing comfortably w/ O2 sats 99% on 2 L/min Expected post op acute blood loss anemia, mild, stable Expected post op atelectasis, mild Expected post op volume excess, weight reportedly 10 lbs > preop Type II diabetes mellitus, excellent glycemic control   Mobilize  Diuresis  DAPT x6 months  Statin  Increase beta blocker  Change CBG's and SSI to ac/hs  Continue to hold metformin until po intake improves  Transfer 4E   Braulio Conte  Keturah Barre, MD 11/29/2016 7:50 AM

## 2016-11-30 ENCOUNTER — Inpatient Hospital Stay (HOSPITAL_COMMUNITY): Payer: PRIVATE HEALTH INSURANCE

## 2016-11-30 LAB — BASIC METABOLIC PANEL
Anion gap: 8 (ref 5–15)
BUN: 8 mg/dL (ref 6–20)
CO2: 22 mmol/L (ref 22–32)
Calcium: 8.2 mg/dL — ABNORMAL LOW (ref 8.9–10.3)
Chloride: 106 mmol/L (ref 101–111)
Creatinine, Ser: 0.71 mg/dL (ref 0.44–1.00)
GFR calc Af Amer: >60 mL/min (ref >60)
GFR calc non Af Amer: >60 mL/min (ref >60)
Glucose, Bld: 126 mg/dL — ABNORMAL HIGH (ref 65–99)
Potassium: 4 mmol/L (ref 3.5–5.1)
Sodium: 136 mmol/L (ref 135–145)

## 2016-11-30 LAB — CBC
HCT: 27.9 % — ABNORMAL LOW (ref 36.0–46.0)
Hemoglobin: 9.2 g/dL — ABNORMAL LOW (ref 12.0–15.0)
MCH: 29.9 pg (ref 26.0–34.0)
MCHC: 33 g/dL (ref 30.0–36.0)
MCV: 90.6 fL (ref 78.0–100.0)
Platelets: 282 10*3/uL (ref 150–400)
RBC: 3.08 MIL/uL — ABNORMAL LOW (ref 3.87–5.11)
RDW: 15 % (ref 11.5–15.5)
WBC: 9.6 10*3/uL (ref 4.0–10.5)

## 2016-11-30 LAB — GLUCOSE, CAPILLARY
GLUCOSE-CAPILLARY: 179 mg/dL — AB (ref 65–99)
GLUCOSE-CAPILLARY: 216 mg/dL — AB (ref 65–99)
Glucose-Capillary: 247 mg/dL — ABNORMAL HIGH (ref 65–99)
Glucose-Capillary: 146 mg/dL — ABNORMAL HIGH (ref 65–99)

## 2016-11-30 NOTE — Care Management Note (Signed)
Case Management Note  Patient Details  Name: [name] [name] MRN: [id number] Date of Birth: 1971/01/03  Subjective/Objective:  From home , post op CABG, conts on vent.  10/30 [name], BSN- from home, POD 3 CABG, she states her youngest daughter will be with her til Nov 4th and the oldest daughter will be with her til Nov 11th , then her other family members will help assist her there after.  She states she is for poss dc tomorrow, she states she goes to the [facility] in [place], and she gets her medications from the [facility] in [place] her PCP in [place] is [facility].  Will need to fax DC summary to her PCP at the [facility] at discharge at [phone],  Phone is [phone].  Also for any dc needs call [name] [name]  At [facility].                                   Action/Plan: NCM will follow for dc needs.   Expected Discharge Date:                  Expected Discharge Plan:  Home/Self Care  In-House Referral:     Discharge planning Services  CM Consult  Post Acute Care Choice:    Choice offered to:     DME Arranged:    DME Agency:     HH Arranged:    HH Agency:     Status of Service:  In process, will continue to follow  If discussed at Long Length of Stay Meetings, dates discussed:    Additional Comments:  [name] Mayo, RN 11/30/2016, 2:07 PM

## 2016-11-30 NOTE — Discharge Summary (Signed)
Physician Discharge Summary  Patient ID: [name] [name] MRN: [id number] DOB/AGE: 1970-07-01 46 y.o.  Admit date: 11/22/2016 Discharge date: 12/02/2016   Admission Diagnoses:Severe 3-vessel Coronary Artery Disease S/P Acute Non-ST Segment Elevation Myocardial Infarction  Discharge Diagnoses:  Principal Problem:   S/P CABG x 4 Active Problems:   Diabetes mellitus type II, uncontrolled ([place])   Hyperlipidemia due to type 2 diabetes mellitus (H[facility])   NSTEMI (non-ST elevated myocardial infarction) ([place])   Coronary artery disease involving native coronary artery of native heart with unstable angina pectoris ([place])   Essential hypertension   HPI: at time of consultation  Patient is a 46 year old obese African-American female with no previous history of coronary artery disease but risk factors notable for history of insulin-dependent type 2 diabetes mellitus,hyperlipidemia, and chronic immunosuppression who has been referred for surgical consultation to discuss treatment options for management of severe three-vessel coronary artery disease status post acute non-ST segment elevation myocardial infarction.  Patient developed sudden onset dull substernal chest pain associated with nausea and vomiting and radiation to the arms yesterday morning. Symptoms became severe and persisted ultimately prompting the patient presented to the emergency department where 12-lead EKG reveals sinus rhythm without acute ST segment elevation. Troponin levels were elevated.  The patient was admitted to the hospital and started on intravenous heparin and nitroglycerin. Chest pain has improved. The patient underwent diagnostic cardiac catheterization earlier today by Dr. Burt Knack and was found to have severe three-vessel coronary artery disease with preserved left ventricular systolic function. Cardiothoracic surgical consultation was requested.  The patient is single and lives locally with her granddaughter in [place].  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 She lives a sedentary lifestyle and does not exercise at all. She states that 3 weeks ago she had some sharp chest pain for which she was seen at urgent care in [facility] but discharged without intervention. She otherwise denies any previous history of substernal chest pain or chest tightness other than that which brought her to the emergency room yesterday. She describes long-standing symptoms of exertional shortness of breath and severe chronic lower extremity edema. She denies any history of resting shortness of breath, PND, orthopnea, palpitations, dizzy spells, or syncope.  The patient has history of Crohn's disease which has been under fairly good control recently. She also has a remote history of vasculitis. She is chronically immunosuppressed  Discharged Condition: good  Hospital Course: patient was admitted to the emergency department with non-STEMI EKG showing new T-wave inversion in the inferior leads. She was admitted for further evaluation and treatment to include cardiology consultation. She was placed on each venous heparin and nitroglycerin drip. Cardiac catheterization was scheduled she was found to have severe three-vessel coronary artery disease. Surgical consultation was obtained with Darylene Price MD who evaluated the patient and her studies and agreed with recommendations to proceed with coronary artery surgical revascularization. Procedure is scheduled and on 11/26/2016 she was taken to the operating room where she underwent a below described procedure. She tolerated well and was taken to the surgical intensive care unit in stable condition.  Postoperative hospital course:  The patient is overall progressed well. She initially did require some milrinone inotropic support. But this was weaned without difficulty. She does have expected acute blood loss anemia which is stabilized. It is noted that her TSH is substantially elevated 45  preoperatively. She has been started on Synthroid but this will need to be managed closely as an outpatient.additionally her hemoglobin A1c is markedly elevated at  9.4. This will also require aggressive long-term management as an outpatient.she is on insulin and metformin at home. Incisions are noted to be healing well without evidence of infection.She is tolerating gradually increasing activities using cardiac rehabilitation phase 1 modalities. Oxygen has been weaned and she maintains good saturations on room air.she does have an expected acute blood loss anemiaand values are stabilizing. Renal function is within normal limits. At time of discharge the patient is felt to be quite stable.  Consults: cardiology  Significant Diagnostic Studies: angiography: cardiac cath 11/23/16  Treatments: surgery:  Date of Procedure:    11/26/2016  Preoperative Diagnosis:        Severe 3-vessel Coronary Artery Disease  S/P Acute Non-ST Segment Elevation Myocardial Infarction  Postoperative Diagnosis:    Same  Procedure:        Coronary Artery Bypass Grafting x 4              Left Internal Mammary Artery to Distal Left Anterior Descending Coronary Artery             Saphenous Vein Graft to Right Posterolateral Branch Coronary Artery             Saphenous Vein Graft to Obtuse Marginal Branch of Left Circumflex Coronary Artery             Sapheonous Vein Graft to First Diagonal Branch Coronary Artery             Endoscopic Vein Harvest from Right Thigh and Lower Leg  Surgeon:        Valentina Gu. Roxy Manns, MD  Assistant:       John Giovanni, PA-C  Anesthesia:    Rica Koyanagi, MD  Operative Findings: ? Normal left ventricular systolic function ? Good quality left internal mammary artery conduit ? Good quality saphenous vein conduit ? Good quality target vessels for grafting with exception of terminal branches of RCA    Discharge Exam: Blood pressure 98/76, pulse 93, temperature 98.6 F (37  C), temperature source Oral, resp. rate (!) 21, height 5' 1"  (1.549 m), weight 218 lb (98.9 kg), SpO2 94 %.  General appearance: alert, cooperative and no distress Heart: regular rate and rhythm Lungs: dim in bases Abdomen: benign Extremities: edema is improved Wound: incis healing well  Disposition: 01-Home or Self Care  Discharge Instructions    Amb Referral to Cardiac Rehabilitation    Complete by:  As directed    Diagnosis:   CABG NSTEMI     CABG X ___:  4     Allergies as of 12/02/2016      Reactions   Shellfish Allergy Hives, Swelling   And hives      Medication List    STOP taking these medications   dextrose 40 % Gel Commonly known as:  GLUTOSE   ibuprofen 200 MG tablet Commonly known as:  ADVIL,MOTRIN   methotrexate 2.5 MG tablet Commonly known as:  RHEUMATREX   sulfamethoxazole-trimethoprim 800-160 MG tablet Commonly known as:  BACTRIM DS,SEPTRA DS     TAKE these medications   aspirin 81 MG EC tablet Take 1 tablet (81 mg total) by mouth daily.   atorvastatin 80 MG tablet Commonly known as:  LIPITOR Take 1 tablet (80 mg total) by mouth daily at 6 PM. What changed:  medication strength  how much to take  when to take this   clopidogrel 75 MG tablet Commonly known as:  PLAVIX Take 1 tablet (75 mg total) by mouth daily.  folic acid 1 MG tablet Commonly known as:  FOLVITE Take 1 mg by mouth daily.   furosemide 40 MG tablet Commonly known as:  LASIX Take 1 tablet (40 mg total) by mouth daily.   insulin glargine 100 UNIT/ML injection Commonly known as:  LANTUS Inject 0.25 mLs (25 Units total) into the skin at bedtime. What changed:  how much to take   iron polysaccharides 150 MG capsule Commonly known as:  NIFEREX Take 1 capsule (150 mg total) by mouth daily.   levothyroxine 100 MCG tablet Commonly known as:  SYNTHROID Take 1 tablet (100 mcg total) by mouth daily before breakfast. What changed:  how much to take  when to take  this   metformin 1000 MG (OSM) 24 hr tablet Commonly known as:  FORTAMET Take 1 tablet (1,000 mg total) by mouth 2 (two) times daily with a meal. What changed:  medication strength  when to take this   metoprolol tartrate 25 MG tablet Commonly known as:  LOPRESSOR Take 1 tablet (25 mg total) by mouth 2 (two) times daily.   omeprazole 20 MG capsule Commonly known as:  PRILOSEC Take 2 tablets by mouth once daily   oxyCODONE 5 MG immediate release tablet Commonly known as:  Oxy IR/ROXICODONE Take 1-2 tablets (5-10 mg total) by mouth every 6 (six) hours as needed for severe pain.   potassium chloride SA 20 MEQ tablet Commonly known as:  K-DUR,KLOR-CON Take 1 tablet (20 mEq total) by mouth daily.   sitaGLIPtin 50 MG tablet Commonly known as:  JANUVIA Take 1 tablet (50 mg total) by mouth daily.   SUMAtriptan 100 MG tablet Commonly known as:  IMITREX Take 50-100 mg by mouth 2 (two) times daily as needed for migraine. May repeat in 2 hours if headache persists or recurs.      Follow-up Information    Leonie Man, MD Follow up.   Specialty:  Cardiology Why:  2 week appt to be scheduled , in discharge paperwork. If not done , please call office to arrange Contact information: [address] [place] [address] [phone]        Rexene Alberts, MD Follow up.   Specialty:  Cardiothoracic Surgery Why:  appointment to see the surgeon on 01/03/2017 at 2 PM. Please obtain a chest x-ray [facility] at 1:30 PM. [facility] imaging is located on the first floor in the same office complex. Contact information: [address] [place] [address] [phone]          The patient has been discharged on:   1.Beta Blocker:  Yes [ y  ]                              No   [   ]                              If No, reason:  2.Ace Inhibitor/ARB: Yes [   ]                                     No  [n  ]  If No, reason:labile BP  3.Statin:   Yes [ y  ]                  No  [   ]                  If No, reason:  4.Ecasa:  Yes  [ y  ]                  No   [   ]                  If No, reason:  Signed: Amadea Keagy E 12/02/2016, 7:51 AM

## 2016-11-30 NOTE — Progress Notes (Signed)
      [address]       [place] [address]             [phone]     CARDIOTHORACIC SURGERY PROGRESS NOTE  4 Days Post-Op  S/P Procedure(s) (LRB): CORONARY ARTERY BYPASS GRAFTING (CABG), ON PUMP, TIMES FOUR, USING LEFT INTERNAL MAMMARY ARTERY AND ENDOSCOPICALLY HARVESTED RIGHT GREATER SAPHENOUS VEIN (N/A) TRANSESOPHAGEAL ECHOCARDIOGRAM (TEE) (N/A)  Subjective: Feels better.  No SOB.  Mild soreness. Appetite improved  Objective: Vital signs in last 24 hours: Temp:  [98.1 F (36.7 C)-99 F (37.2 C)] 98.3 F (36.8 C) (10/30 1600) Pulse Rate:  [85-107] 90 (10/30 1600) Cardiac Rhythm: Normal sinus rhythm (10/30 1600) Resp:  [14-28] 16 (10/30 1600) BP: (88-128)/(56-86) 92/61 (10/30 1600) SpO2:  [91 %-100 %] 96 % (10/30 1600) Weight:  [221 lb 8 oz (100.5 kg)] 221 lb 8 oz (100.5 kg) (10/30 0600)  Physical Exam:  Rhythm:   sinus  Breath sounds: clear  Heart sounds:  RRR  Incisions:  Dressing dry, intact  Abdomen:  Soft, non-distended, non-tender  Extremities:  Warm, well-perfused    Intake/Output from previous day: 10/29 0701 - 10/30 0700 In: 710 [P.O.:510; IV Piggyback:200] Out: 1075 [Urine:1075] Intake/Output this shift: Total I/O In: 483 [P.O.:480; I.V.:3] Out: 925 [Urine:925]  Lab Results:  Recent Labs  11/29/16 0407 11/30/16 0232  WBC 11.6* 9.6  HGB 9.9* 9.2*  HCT 30.1* 27.9*  PLT 218 282   BMET:  Recent Labs  11/29/16 0407 11/30/16 0232  NA 135 136  K 3.4* 4.0  CL 101 106  CO2 26 22  GLUCOSE 88 126*  BUN 7 8  CREATININE 0.72 0.71  CALCIUM 7.7* 8.2*    CBG (last 3)   Recent Labs  11/29/16 2256 11/30/16 0846 11/30/16 1204  GLUCAP 178* 179* 216*   PT/INR:  No results for input(s): LABPROT, INR in the last 72 hours.  CXR:  N/A  Assessment/Plan: S/P Procedure(s) (LRB): CORONARY ARTERY BYPASS GRAFTING (CABG), ON PUMP, TIMES FOUR, USING LEFT INTERNAL MAMMARY ARTERY AND ENDOSCOPICALLY HARVESTED RIGHT GREATER SAPHENOUS  VEIN (N/A) TRANSESOPHAGEAL ECHOCARDIOGRAM (TEE) (N/A)  Making good progress Awaiting bed on 4E for transfer BP too low to add ACE-I Tentatively plan D/C home tomorrow  Rexene Alberts, MD 11/30/2016

## 2016-11-30 NOTE — Discharge Instructions (Signed)
Endoscopic Saphenous Vein Harvesting, Care After Refer to this sheet in the next few weeks. These instructions provide you with information about caring for yourself after your procedure. Your health care provider may also give you more specific instructions. Your treatment has been planned according to current medical practices, but problems sometimes occur. Call your health care provider if you have any problems or questions after your procedure. What can I expect after the procedure? After the procedure, it is common to have:  Pain.  Bruising.  Swelling.  Numbness.  Follow these instructions at home: Medicine  Take over-the-counter and prescription medicines only as told by your health care provider.  Do not drive or operate heavy machinery while taking prescription pain medicine. Incision care   Follow instructions from your health care provider about how to take care of the cut made during surgery (incision). Make sure you: ? Wash your hands with soap and water before you change your bandage (dressing). If soap and water are not available, use hand sanitizer. ? Change your dressing as told by your health care provider. ? Leave stitches (sutures), skin glue, or adhesive strips in place. These skin closures may need to be in place for 2 weeks or longer. If adhesive strip edges start to loosen and curl up, you may trim the loose edges. Do not remove adhesive strips completely unless your health care provider tells you to do that.  Check your incision area every day for signs of infection. Check for: ? More redness, swelling, or pain. ? More fluid or blood. ? Warmth. ? Pus or a bad smell. General instructions  Raise (elevate) your legs above the level of your heart while you are sitting or lying down.  Do any exercises your health care providers have given you. These may include deep breathing, coughing, and walking exercises.  Do not shower, take baths, swim, or use a hot tub  unless told by your health care provider.  Wear your elastic stocking if told by your health care provider.  Keep all follow-up visits as told by your health care provider. This is important. Contact a health care provider if:  Medicine does not help your pain.  Your pain gets worse.  You have new leg bruises or your leg bruises get bigger.  You have a fever.  Your leg feels numb.  You have more redness, swelling, or pain around your incision.  You have more fluid or blood coming from your incision.  Your incision feels warm to the touch.  You have pus or a bad smell coming from your incision. Get help right away if:  Your pain is severe.  You develop pain, tenderness, warmth, redness, or swelling in any part of your leg.  You have chest pain.  You have trouble breathing. This information is not intended to replace advice given to you by your health care provider. Make sure you discuss any questions you have with your health care provider. Document Released: 09/30/2010 Document Revised: 06/26/2015 Document Reviewed: 12/02/2014 Elsevier Interactive Patient Education  2018 [employer]. Coronary Artery Bypass Grafting, Care After These instructions give you information on caring for yourself after your procedure. Your doctor may also give you more specific instructions. Call your doctor if you have any problems or questions after your procedure. Follow these instructions at home:  Only take medicine as told by your doctor. Take medicines exactly as told. Do not stop taking medicines or start any new medicines without talking to your doctor first.  Take your pulse as told by your doctor.  Do deep breathing as told by your doctor. Use your breathing device (incentive spirometer), if given, to practice deep breathing several times a day. Support your chest with a pillow or your arms when you take deep breaths or cough.  Keep the area clean, dry, and protected where the  surgery cuts (incisions) were made. Remove bandages (dressings) only as told by your doctor. If strips were applied to surgical area, do not take them off. They fall off on their own.  Check the surgery area daily for puffiness (swelling), redness, or leaking fluid.  If surgery cuts were made in your legs: ? Avoid crossing your legs. ? Avoid sitting for long periods of time. Change positions every 30 minutes. ? Raise your legs when you are sitting. Place them on pillows.  Wear stockings that help keep blood clots from forming in your legs (compression stockings).  Only take sponge baths until your doctor says it is okay to take showers. Pat the surgery area dry. Do not rub the surgery area with a washcloth or towel. Do not bathe, swim, or use a hot tub until your doctor says it is okay.  Eat foods that are high in fiber. These include raw fruits and vegetables, whole grains, beans, and nuts. Choose lean meats. Avoid canned, processed, and fried foods.  Drink enough fluids to keep your pee (urine) clear or pale yellow.  Weigh yourself every day.  Rest and limit activity as told by your doctor. You may be told to: ? Stop any activity if you have chest pain, shortness of breath, changes in heartbeat, or dizziness. Get help right away if this happens. ? Move around often for short amounts of time or take short walks as told by your doctor. Gradually become more active. You may need help to strengthen your muscles and build endurance. ? Avoid lifting, pushing, or pulling anything heavier than 10 pounds (4.5 kg) for at least 6 weeks after surgery.  Do not drive until your doctor says it is okay.  Ask your doctor when you can go back to work.  Ask your doctor when you can begin sexual activity again.  Follow up with your doctor as told. Contact a doctor if:  You have puffiness, redness, more pain, or fluid draining from the incision site.  You have a fever.  You have puffiness in your  ankles or legs.  You have pain in your legs.  You gain 2 or more pounds (0.9 kg) a day.  You feel sick to your stomach (nauseous) or throw up (vomit).  You have watery poop (diarrhea). Get help right away if:  You have chest pain that goes to your jaw or arms.  You have shortness of breath.  You have a fast or irregular heartbeat.  You notice a "clicking" in your breastbone when you move.  You have numbness or weakness in your arms or legs.  You feel dizzy or light-headed. This information is not intended to replace advice given to you by your health care provider. Make sure you discuss any questions you have with your health care provider. Document Released: 01/23/2013 Document Revised: 06/26/2015 Document Reviewed: 06/27/2012 Elsevier Interactive Patient Education  2017 [employer].

## 2016-12-01 DIAGNOSIS — I208 Other forms of angina pectoris: Secondary | ICD-10-CM

## 2016-12-01 LAB — GLUCOSE, CAPILLARY
GLUCOSE-CAPILLARY: 254 mg/dL — AB (ref 65–99)
Glucose-Capillary: 225 mg/dL — ABNORMAL HIGH (ref 65–99)
Glucose-Capillary: 257 mg/dL — ABNORMAL HIGH (ref 65–99)

## 2016-12-01 MED ORDER — INSULIN DETEMIR 100 UNIT/ML ~~LOC~~ SOLN
25.0000 [IU] | Freq: Every day | SUBCUTANEOUS | Status: DC
  Administered 2016-12-01 – 2016-12-02 (×2): 25 [IU] via SUBCUTANEOUS
  Filled 2016-12-01 (×2): qty 0.25

## 2016-12-01 MED ORDER — METFORMIN HCL ER 500 MG PO TB24
1000.0000 mg | ORAL_TABLET | Freq: Every day | ORAL | Status: DC
  Filled 2016-12-01: qty 2

## 2016-12-01 NOTE — Progress Notes (Signed)
Inpatient Diabetes Program Recommendations  AACE/ADA: New Consensus Statement on Inpatient Glycemic Control (2015)  Target Ranges:  Prepandial:   less than 140 mg/dL      Peak postprandial:   less than 180 mg/dL (1-2 hours)      Critically ill patients:  140 - 180 mg/dL   Lab Results  Component Value Date   GLUCAP 146 (H) 11/30/2016   HGBA1C 9.4 (H) 11/24/2016    Review of Glycemic Control  Diabetes history: DM2 Outpatient Diabetes medications: Lantus 45 units+ Metformin 1 gm qd Current orders for Inpatient glycemic control: Levemir 25 units daily + Novolog resistant correction  Inpatient Diabetes Program Recommendations:   Received consult.  While in the hospital, -Add Novolog 3 units tid meal coverage if eats 50%  On discharge, consider: -Lantus 25 units -Metformin 1 gm bid -Januvia 50 mg daily  Thank you, Nani Gasser. Hence Derrick, RN, MSN, CDE  Diabetes Coordinator Inpatient Glycemic Control Team Team Pager [phone] (8am-5pm) 12/01/2016 8:37 AM

## 2016-12-01 NOTE — Progress Notes (Addendum)
[address]       [place] [address]             [phone]      5 Days Post-Op Procedure(s) (LRB): CORONARY ARTERY BYPASS GRAFTING (CABG), ON PUMP, TIMES FOUR, USING LEFT INTERNAL MAMMARY ARTERY AND ENDOSCOPICALLY HARVESTED RIGHT GREATER SAPHENOUS VEIN (N/A) TRANSESOPHAGEAL ECHOCARDIOGRAM (TEE) (N/A) Subjective: Doing well, feels anxious about going home  Objective: Vital signs in last 24 hours: Temp:  [97.6 F (36.4 C)-99 F (37.2 C)] 97.6 F (36.4 C) (10/31 0623) Pulse Rate:  [89-99] 97 (10/30 2000) Cardiac Rhythm: Normal sinus rhythm (10/31 0701) Resp:  [12-25] 12 (10/30 2000) BP: (92-114)/(61-79) 104/72 (10/30 2000) SpO2:  [94 %-100 %] 95 % (10/30 2000) Weight:  [219 lb 4.8 oz (99.5 kg)] 219 lb 4.8 oz (99.5 kg) (10/31 0539)  Hemodynamic parameters for last 24 hours:    Intake/Output from previous day: 10/30 0701 - 10/31 0700 In: 483 [P.O.:480; I.V.:3] Out: 1025 [Urine:1025] Intake/Output this shift: No intake/output data recorded.  General appearance: alert, cooperative and no distress Heart: regular rate and rhythm Lungs: dim right>left base Abdomen: benign Extremities: + minor edema Wound: incis healing well  Lab Results:  Recent Labs  11/29/16 0407 11/30/16 0232  WBC 11.6* 9.6  HGB 9.9* 9.2*  HCT 30.1* 27.9*  PLT 218 282   BMET:  Recent Labs  11/29/16 0407 11/30/16 0232  NA 135 136  K 3.4* 4.0  CL 101 106  CO2 26 22  GLUCOSE 88 126*  BUN 7 8  CREATININE 0.72 0.71  CALCIUM 7.7* 8.2*    PT/INR: No results for input(s): LABPROT, INR in the last 72 hours. ABG    Component Value Date/Time   PHART 7.312 (L) 11/27/2016 0104   HCO3 20.4 11/27/2016 0104   TCO2 20 (L) 11/27/2016 1851   ACIDBASEDEF 5.0 (H) 11/27/2016 0104   O2SAT 76.8 11/29/2016 0410   CBG (last 3)   Recent Labs  11/30/16 1204 11/30/16 1632 11/30/16 2137  GLUCAP 216* 247* 146*    Meds Scheduled Meds: . acetaminophen  1,000 mg Oral Q6H  .  aspirin EC  81 mg Oral Daily  . atorvastatin  80 mg Oral q1800  . bisacodyl  10 mg Oral Daily   Or  . bisacodyl  10 mg Rectal Daily  . clopidogrel  75 mg Oral Daily  . docusate sodium  200 mg Oral Daily  . enoxaparin (LOVENOX) injection  40 mg Subcutaneous QHS  . folic acid  1 mg Oral Daily  . furosemide  40 mg Oral BID  . insulin aspart  0-24 Units Subcutaneous TID AC & HS  . insulin detemir  10 Units Subcutaneous Daily  . iron polysaccharides  150 mg Oral Daily  . levothyroxine  100 mcg Oral QAC breakfast  . mouth rinse  15 mL Mouth Rinse BID  . metoprolol tartrate  25 mg Oral BID  . pantoprazole  80 mg Oral QHS  . potassium chloride  20 mEq Oral BID  . sodium chloride flush  3 mL Intravenous Q12H  . sodium chloride flush  3 mL Intravenous Q12H   Continuous Infusions: . sodium chloride    . sodium chloride    . lactated ringers    . lactated ringers 20 mL/hr at 11/28/16 1900   PRN Meds:.sodium chloride, metoprolol tartrate, morphine injection, ondansetron (ZOFRAN) IV, oxyCODONE, sodium chloride flush, sodium chloride flush, SUMAtriptan, traMADol  Xrays Dg Chest 2 View  Result Date: 11/30/2016 CLINICAL DATA:  Follow-up atelectasis EXAM: CHEST  2 VIEW COMPARISON:  11/29/2016 FINDINGS: Right jugular sheath has been removed in the interval. Postsurgical changes are again seen. Bilateral pleural effusions and bibasilar atelectatic changes are again noted and relatively stable. No pneumothorax is noted. No other focal abnormality is seen. IMPRESSION: Bilateral pleural effusions and bibasilar atelectatic changes stable from the previous exam. Electronically Signed   By: Inez Catalina M.D.   On: 11/30/2016 08:57    Assessment/Plan: S/P Procedure(s) (LRB): CORONARY ARTERY BYPASS GRAFTING (CABG), ON PUMP, TIMES FOUR, USING LEFT INTERNAL MAMMARY ARTERY AND ENDOSCOPICALLY HARVESTED RIGHT GREATER SAPHENOUS VEIN (N/A) TRANSESOPHAGEAL ECHOCARDIOGRAM (TEE) (N/A)   1 doing well overall 2  feels SOB at times, small to mod right effusion/atx playing a part- cont pulm toilet and diuresis- could potentially require a thoracentesis but not currently 3 she would like to talk to cardiac rehab and walk this am before we decide on discharge decision  4 sugar control currently fairly poor, increase insulin- would benefit from diabetes coordinator- will order 5 no new labs  6 needs close eval of thyroid fxn as outpatient  LOS: 9 days    GOLD,WAYNE E 12/01/2016   I have seen and examined the patient and agree with the assessment and plan as outlined.   Overall making satisfactory progress but still moving slow.   Restart metformin.  Increase insulin.  Consult Diabetes Management team.  Possible d/c home later today or tomorrow.   Rexene Alberts, MD 12/01/2016

## 2016-12-01 NOTE — Progress Notes (Signed)
CARDIAC REHAB PHASE I   PRE:  Rate/Rhythm: 105 ST  BP:  Supine:   Sitting: 114/84  Standing:    SaO2: 92%RA  MODE:  Ambulation: 470 ft   POST:  Rate/Rhythm: 114 ST  BP:  Supine:   Sitting: 104/79  Standing:    SaO2: 92-94%RA hall and 90%RA rooom 0935-1030 Pt stated she walked three times yesterday on oxygen and only had to rest twice. Today she walked 470 ft on RA with rolling walker and asst x1 and had to stop five to six times due to SOB.Marland Kitchen Visible DOE. Encouraged pursed lip breathing. Took sats several times during walk and above 90%. Pt stated she feels more SOB today. To recliner after walk. Education completed with pt who voiced understanding. Encouraged IS, sternal precautions, heart healthy diet and ex ed. Discussed CRP 2 and will refer to [place] [facility]. Pt would like walker for home use. Notified RN. Wrote down how to view discharge video and encouraged her to view.    Graylon Good, RN BSN  12/01/2016 10:50 AM

## 2016-12-02 LAB — GLUCOSE, CAPILLARY: GLUCOSE-CAPILLARY: 203 mg/dL — AB (ref 65–99)

## 2016-12-02 MED ORDER — FUROSEMIDE 40 MG PO TABS: 40.0000 mg | ORAL_TABLET | Freq: Every day | ORAL | 0 refills | Status: DC

## 2016-12-02 MED ORDER — METFORMIN HCL ER 500 MG PO TB24
1000.0000 mg | ORAL_TABLET | Freq: Two times a day (BID) | ORAL | Status: DC
  Administered 2016-12-02: 1000 mg via ORAL
  Filled 2016-12-02 (×2): qty 2

## 2016-12-02 MED ORDER — SITAGLIPTIN PHOSPHATE 50 MG PO TABS: 50.0000 mg | ORAL_TABLET | Freq: Every day | ORAL | 1 refills | Status: DC

## 2016-12-02 MED ORDER — CLOPIDOGREL BISULFATE 75 MG PO TABS: 75.0000 mg | ORAL_TABLET | Freq: Every day | ORAL | 1 refills | Status: DC

## 2016-12-02 MED ORDER — POLYSACCHARIDE IRON COMPLEX 150 MG PO CAPS: 150.0000 mg | ORAL_CAPSULE | Freq: Every day | ORAL | 1 refills | Status: DC

## 2016-12-02 MED ORDER — METFORMIN HCL ER (OSM) 1000 MG PO TB24: 1000.0000 mg | ORAL_TABLET | Freq: Two times a day (BID) | ORAL | 1 refills | Status: DC

## 2016-12-02 MED ORDER — LEVOTHYROXINE SODIUM 100 MCG PO TABS: 100.0000 ug | ORAL_TABLET | Freq: Every day | ORAL | 1 refills | Status: DC

## 2016-12-02 MED ORDER — ASPIRIN 81 MG PO TBEC: 81.0000 mg | DELAYED_RELEASE_TABLET | Freq: Every day | ORAL | Status: DC

## 2016-12-02 MED ORDER — ATORVASTATIN CALCIUM 80 MG PO TABS: 80.0000 mg | ORAL_TABLET | Freq: Every day | ORAL | 1 refills | Status: DC

## 2016-12-02 MED ORDER — OXYCODONE HCL 5 MG PO TABS: 5.0000 mg | ORAL_TABLET | Freq: Four times a day (QID) | ORAL | 0 refills | Status: DC | PRN

## 2016-12-02 MED ORDER — INSULIN GLARGINE 100 UNIT/ML ~~LOC~~ SOLN: 25.0000 [IU] | Freq: Every day | SUBCUTANEOUS | 11 refills | Status: AC

## 2016-12-02 MED ORDER — POTASSIUM CHLORIDE CRYS ER 20 MEQ PO TBCR: 20.0000 meq | EXTENDED_RELEASE_TABLET | Freq: Every day | ORAL | 0 refills | Status: DC

## 2016-12-02 MED ORDER — METOPROLOL TARTRATE 25 MG PO TABS: 25.0000 mg | ORAL_TABLET | Freq: Two times a day (BID) | ORAL | 1 refills | Status: DC

## 2016-12-02 NOTE — Care Management Note (Signed)
Case Management Note Original Note Created Zenon Mayo, RN 11/30/2016, 2:07 PM  Patient Details  Name: [name] [name] MRN: [id number] Date of Birth: Jun 27, 1970  Subjective/Objective:  From home , post op CABG, conts on vent.  10/30 Hooker, BSN- from home, POD 3 CABG, she states her youngest daughter will be with her til Nov 4th and the oldest daughter will be with her til Nov 11th , then her other family members will help assist her there after.  She states she is for poss dc tomorrow, she states she goes to the [facility] in [place], and she gets her medications from the [facility] in [place] her PCP in [place] is [facility].  Will need to fax DC summary to her PCP at the [facility] at discharge at [phone],  Phone is [phone].  Also for any dc needs call Norina Buzzard  At [facility].                                   Action/Plan: NCM will follow for dc needs.   Expected Discharge Date:  12/02/16               Expected Discharge Plan:  Home/Self Care  In-House Referral:  NA  Discharge planning Services  CM Consult  Post Acute Care Choice:  Durable Medical Equipment Choice offered to:  Patient  DME Arranged:  Gilford Rile rolling DME Agency:  [place]:  NA [place] Agency:  NA  Status of Service:  Completed, signed off  If discussed at [place] of Stay Meetings, dates discussed:    Discharge Disposition: home/self care   Additional Comments:  12/02/16- 1000- Marvetta Gibbons RN, CM- pt for d/c home today- per cardiac rehab- pt would like RW for home- bedside RN verified and order placed- notified Jermaine with [facility] for DME need- RW to be delivered to room prior to discharge.   Dahlia Client Murdo, RN 12/02/2016, 10:27 AM [phone]

## 2016-12-02 NOTE — Progress Notes (Signed)
CARDIAC REHAB PHASE I   PRE:  Rate/Rhythm: 95      SaO2: 93 RA  MODE:  Ambulation: 440 ft   POST:  Rate/Rhythm: 102     SaO2: 93 RA  Pt ambulated 440 ft independently. Pt's SOB had improved since yesterday's walk. Pt had to stop once during walk to catch breath. Encouraged to do pursed lip breathing. Pt states she is less anxious about going home and is ready. Pt returned to chair with call bell within reach.   [phone]  Carma Lair MS, ACSM CEP  10:49 AM 12/02/2016

## 2016-12-02 NOTE — Progress Notes (Addendum)
[address]       [place] [address]             [phone]      6 Days Post-Op Procedure(s) (LRB): CORONARY ARTERY BYPASS GRAFTING (CABG), ON PUMP, TIMES FOUR, USING LEFT INTERNAL MAMMARY ARTERY AND ENDOSCOPICALLY HARVESTED RIGHT GREATER SAPHENOUS VEIN (N/A) TRANSESOPHAGEAL ECHOCARDIOGRAM (TEE) (N/A) Subjective: Feels better   Objective: Vital signs in last 24 hours: Temp:  [98 F (36.7 C)-100 F (37.8 C)] 98.6 F (37 C) (11/01 0405) Pulse Rate:  [93-104] 93 (11/01 0405) Cardiac Rhythm: Normal sinus rhythm (11/01 0701) Resp:  [21-23] 21 (11/01 0405) BP: (98-118)/(74-97) 98/76 (11/01 0405) SpO2:  [94 %-98 %] 94 % (11/01 0405) Weight:  [218 lb (98.9 kg)] 218 lb (98.9 kg) (11/01 0165)  Hemodynamic parameters for last 24 hours:    Intake/Output from previous day: 10/31 0701 - 11/01 0700 In: 240 [P.O.:240] Out: -  Intake/Output this shift: No intake/output data recorded.  General appearance: alert, cooperative and no distress Heart: regular rate and rhythm Lungs: dim in bases Abdomen: benign Extremities: edema is improved Wound: incis healing well  Lab Results:  Recent Labs  11/30/16 0232  WBC 9.6  HGB 9.2*  HCT 27.9*  PLT 282   BMET:  Recent Labs  11/30/16 0232  NA 136  K 4.0  CL 106  CO2 22  GLUCOSE 126*  BUN 8  CREATININE 0.71  CALCIUM 8.2*    PT/INR: No results for input(s): LABPROT, INR in the last 72 hours. ABG    Component Value Date/Time   PHART 7.312 (L) 11/27/2016 0104   HCO3 20.4 11/27/2016 0104   TCO2 20 (L) 11/27/2016 1851   ACIDBASEDEF 5.0 (H) 11/27/2016 0104   O2SAT 76.8 11/29/2016 0410   CBG (last 3)   Recent Labs  12/01/16 1607 12/01/16 2205 12/02/16 0644  GLUCAP 225* 257* 203*    Meds Scheduled Meds: . aspirin EC  81 mg Oral Daily  . atorvastatin  80 mg Oral q1800  . bisacodyl  10 mg Oral Daily   Or  . bisacodyl  10 mg Rectal Daily  . clopidogrel  75 mg Oral Daily  . docusate sodium   200 mg Oral Daily  . enoxaparin (LOVENOX) injection  40 mg Subcutaneous QHS  . folic acid  1 mg Oral Daily  . furosemide  40 mg Oral BID  . insulin aspart  0-24 Units Subcutaneous TID AC & HS  . insulin detemir  25 Units Subcutaneous Daily  . iron polysaccharides  150 mg Oral Daily  . levothyroxine  100 mcg Oral QAC breakfast  . mouth rinse  15 mL Mouth Rinse BID  . metFORMIN  1,000 mg Oral QPC supper  . metoprolol tartrate  25 mg Oral BID  . pantoprazole  80 mg Oral QHS  . potassium chloride  20 mEq Oral BID  . sodium chloride flush  3 mL Intravenous Q12H  . sodium chloride flush  3 mL Intravenous Q12H   Continuous Infusions: . sodium chloride    . sodium chloride    . lactated ringers    . lactated ringers 20 mL/hr at 11/28/16 1900   PRN Meds:.sodium chloride, metoprolol tartrate, morphine injection, ondansetron (ZOFRAN) IV, oxyCODONE, sodium chloride flush, sodium chloride flush, SUMAtriptan, traMADol  Xrays No results found.  Assessment/Plan: S/P Procedure(s) (LRB): CORONARY ARTERY BYPASS GRAFTING (CABG), ON PUMP, TIMES FOUR, USING LEFT INTERNAL MAMMARY ARTERY AND ENDOSCOPICALLY HARVESTED RIGHT GREATER SAPHENOUS VEIN (  N/A) TRANSESOPHAGEAL ECHOCARDIOGRAM (TEE) (N/A)  1 will follow DM program recs for insulin, metformin and Januvia- she knows to f/u at [facility] for diabetes and thyroid dz very closely and understands the benefit of lowering Carb/sugar intake 2 pulm status generally improved with good sats on RA 3 volume status improved, cont diuretic short term- she has nl EF with mod diastolic dysfunction 4 BP low to tol ARB/ACE-I- poss start as outpatient 5 cont plavix/baby asa 6 no new labs today 7 stable for d/c  LOS: 10 days    GOLD,WAYNE E 12/02/2016  I have seen and examined the patient and agree with the assessment and plan as outlined.  D/C home today.  Instructions given.  Rexene Alberts, MD 12/02/2016 8:18 AM

## 2016-12-02 NOTE — Progress Notes (Signed)
Chest tube sutures removed per MD orders without difficulty.  Will continue to monitor.

## 2016-12-08 ENCOUNTER — Telehealth (HOSPITAL_COMMUNITY): Payer: Self-pay

## 2016-12-08 NOTE — Telephone Encounter (Signed)
Attempted to call patient in regards to [facility] insurance - Lm on Vm

## 2016-12-15 ENCOUNTER — Telehealth (HOSPITAL_COMMUNITY): Payer: Self-pay

## 2016-12-15 ENCOUNTER — Ambulatory Visit: Payer: PRIVATE HEALTH INSURANCE | Admitting: Physician Assistant

## 2016-12-15 ENCOUNTER — Encounter (HOSPITAL_COMMUNITY): Payer: Self-pay

## 2016-12-15 ENCOUNTER — Encounter: Payer: Self-pay | Admitting: Physician Assistant

## 2016-12-15 VITALS — BP 94/72 | HR 85 | Ht 61.0 in | Wt 208.0 lb

## 2016-12-15 DIAGNOSIS — E119 Type 2 diabetes mellitus without complications: Secondary | ICD-10-CM

## 2016-12-15 DIAGNOSIS — R05 Cough: Secondary | ICD-10-CM | POA: Diagnosis not present

## 2016-12-15 DIAGNOSIS — D649 Anemia, unspecified: Secondary | ICD-10-CM | POA: Diagnosis not present

## 2016-12-15 DIAGNOSIS — R059 Cough, unspecified: Secondary | ICD-10-CM

## 2016-12-15 DIAGNOSIS — I25810 Atherosclerosis of coronary artery bypass graft(s) without angina pectoris: Secondary | ICD-10-CM

## 2016-12-15 DIAGNOSIS — E039 Hypothyroidism, unspecified: Secondary | ICD-10-CM | POA: Diagnosis not present

## 2016-12-15 DIAGNOSIS — IMO0001 Reserved for inherently not codable concepts without codable children: Secondary | ICD-10-CM

## 2016-12-15 DIAGNOSIS — Z794 Long term (current) use of insulin: Secondary | ICD-10-CM | POA: Diagnosis not present

## 2016-12-15 LAB — CBC
HEMATOCRIT: 35.5 % (ref 34.0–46.6)
Hemoglobin: 11.5 g/dL (ref 11.1–15.9)
MCH: 29.5 pg (ref 26.6–33.0)
MCHC: 32.4 g/dL (ref 31.5–35.7)
MCV: 91 fL (ref 79–97)
Platelets: 667 10*3/uL — ABNORMAL HIGH (ref 150–379)
RBC: 3.9 x10E6/uL (ref 3.77–5.28)
RDW: 14.5 % (ref 12.3–15.4)
WBC: 8.4 10*3/uL (ref 3.4–10.8)

## 2016-12-15 LAB — BASIC METABOLIC PANEL
BUN / CREAT RATIO: 10 (ref 9–23)
BUN: 9 mg/dL (ref 6–24)
CO2: 21 mmol/L (ref 20–29)
CREATININE: 0.92 mg/dL (ref 0.57–1.00)
Calcium: 9.7 mg/dL (ref 8.7–10.2)
Chloride: 101 mmol/L (ref 96–106)
GFR, EST AFRICAN AMERICAN: 86 mL/min/{1.73_m2} (ref >59)
GFR, EST NON AFRICAN AMERICAN: 75 mL/min/{1.73_m2} (ref >59)
Glucose: 161 mg/dL — ABNORMAL HIGH (ref 65–99)
POTASSIUM: 4.2 mmol/L (ref 3.5–5.2)
SODIUM: 140 mmol/L (ref 134–144)

## 2016-12-15 MED ORDER — METOPROLOL TARTRATE 25 MG PO TABS: 12.5000 mg | ORAL_TABLET | Freq: Two times a day (BID) | ORAL | 1 refills | Status: DC

## 2016-12-15 NOTE — Patient Instructions (Signed)
Medication Instructions:  DECREASE Metoprolol to 12.5 mg Take 1 tablet twice a day TAKE Lasix and Potassium Daily  Labwork: Your physician recommends that you return for lab work in: [facility], BMET Your physician recommends that you return for lab work in: 1 [place] (12/20/2016)  Testing/Procedures: none  Follow-Up: Your physician recommends that you schedule a follow-up appointment in: 2-3 MONTHS WITH DR [facility]   Any Other Special Instructions Will Be Listed Below (If Applicable).  If you need a refill on your cardiac medications before your next appointment, please call your pharmacy.

## 2016-12-15 NOTE — Telephone Encounter (Signed)
2nd attempt to call patient in regards to [place] insurance.. LM on Vm. Sending letter.

## 2016-12-15 NOTE — Progress Notes (Signed)
Cardiology Office Note    Date:  12/17/2016   ID:  [name] [name], DOB January 07, 1971, MRN [id number]  PCP:  [facility]  Cardiologist:  Dr. Ellyn Hack   Chief Complaint  Patient presents with  . Follow-up  . Shortness of Breath  . Headache  . Chest Pain    Today  . Edema    Legs and feet.    History of Present Illness:  [name] [name] is a 46 y.o. female with PMH of IDDM, GERD, Grave's disease, hypothyroidism, vasculitis, endometriosis and CAD s/p CABG. She most recently presented to the hospital on 11/22/2016 with chest pain. Initial troponin was negative, however subsequent troponin was elevated. Cardiac catheterization performed on 11/23/2016 showed diffusely diseased RCA with total occlusion of distal RCA, this was felt to be the culprit lesion for her NSTEMI, she also got severe proximal LAD and proximal left circumflex stenosis. EF was 50-55%. Her TSH was quite elevated, she was restarted on Synthroid. She was evaluated by Dr. Roxy Manns and eventually underwent LIMA to LAD, SVG to OM, SVG to PL and SVG to diagonal on 11/26/2016. preprocedural carotid Doppler showed only mild disease bilaterally. ABI was normal. Intraoperative TEE showed EF 50-55%, grade 2 DD, trace mitral regurgitation and tricuspid regurgitation. Her hemoglobin A1c was also uncontrolled at 9.4.  Patient has been doing well since discharge except she has been having cough and orthopnea. She only took one dose of Lasix since discharge and has not been taking potassium supplement. She will need to to follow-up with her PCP regarding the thyroid medication and diabetic management. I urged her to start taking Lasix on a daily basis. I will obtain a CBC to check for postoperative anemia and a basic metabolic panel. She will need a repeat BMET in a week. Otherwise she denies any chest pain.   Past Medical History:  Diagnosis Date  . Allergic rhinitis   . Anemia   . Arthritis    "mostly in my hands" (11/23/2016)    . Borderline high cholesterol   . Coronary artery disease   . Endometriosis   . Fibromyalgia   . GERD (gastroesophageal reflux disease)   . Grave's disease    S/P irradiation  . Hypothyroid    S/P radiation  . Left sided ulcerative colitis ([place])   . Migraine headache    "~ 4/month" (11/23/2016)  . NSTEMI (non-ST elevated myocardial infarction) ([place]) 11/22/2016  . 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 Sleep apnea    "couldn't tolerate the mask; only had a slight case of it" (11/23/2016)  . Type II diabetes mellitus ([place])   . Vasculitis [facility])     Past Surgical History:  Procedure Laterality Date  . ABDOMINAL HYSTERECTOMY  07/2006   "Davinci; partial"  . CARDIAC CATHETERIZATION  11/23/2016  . CESAREAN SECTION  1994  . COLONOSCOPY W/ BIOPSIES AND POLYPECTOMY  03/2008- 2017 X "4 at least"  . CORONARY ARTERY BYPASS GRAFT N/A 11/26/2016   Procedure: CORONARY ARTERY BYPASS GRAFTING (CABG), ON PUMP, TIMES FOUR, USING LEFT INTERNAL MAMMARY ARTERY AND ENDOSCOPICALLY HARVESTED RIGHT GREATER SAPHENOUS VEIN;  Surgeon: Rexene Alberts, MD;  Location: [facility];  Service: Open Heart Surgery;  Laterality: N/A;  LIMA-LAD, SVG-OM, SVG-PL, SVG-DIAG  . DILATION AND CURETTAGE OF UTERUS  1996  . LEFT HEART CATH AND CORONARY ANGIOGRAPHY N/A 11/23/2016  Procedure: LEFT HEART CATH AND CORONARY ANGIOGRAPHY;  Surgeon: Sherren Mocha, MD;  Location: [facility];  Service: Cardiovascular;  Laterality: N/A;  . TEE WITHOUT CARDIOVERSION N/A 11/26/2016   Procedure: TRANSESOPHAGEAL ECHOCARDIOGRAM (TEE);  Surgeon: Rexene Alberts, MD;  Location: [facility];  Service: Open Heart Surgery;  Laterality: N/A;    Current Medications: Outpatient Medications Prior to Visit  Medication Sig Dispense Refill  . aspirin EC 81 MG EC tablet Take 1 tablet (81 mg total) by mouth daily.    Marland Kitchen atorvastatin (LIPITOR) 80 MG tablet Take 1  tablet (80 mg total) by mouth daily at 6 PM. 30 tablet 1  . clopidogrel (PLAVIX) 75 MG tablet Take 1 tablet (75 mg total) by mouth daily. 30 tablet 1  . folic acid (FOLVITE) 1 MG tablet Take 1 mg by mouth daily.    . furosemide (LASIX) 40 MG tablet Take 1 tablet (40 mg total) by mouth daily. 30 tablet 0  . insulin glargine (LANTUS) 100 UNIT/ML injection Inject 0.25 mLs (25 Units total) into the skin at bedtime. 10 mL 11  . iron polysaccharides (NIFEREX) 150 MG capsule Take 1 capsule (150 mg total) by mouth daily. 30 capsule 1  . levothyroxine (SYNTHROID) 100 MCG tablet Take 1 tablet (100 mcg total) by mouth daily before breakfast. 30 tablet 1  . metFORMIN (FORTAMET) 1000 MG (OSM) 24 hr tablet Take 1 tablet (1,000 mg total) by mouth 2 (two) times daily with a meal. 60 tablet 1  . omeprazole (PRILOSEC) 20 MG capsule Take 2 tablets by mouth once daily    . oxyCODONE (OXY IR/ROXICODONE) 5 MG immediate release tablet Take 1-2 tablets (5-10 mg total) by mouth every 6 (six) hours as needed for severe pain. 30 tablet 0  . potassium chloride SA (K-DUR,KLOR-CON) 20 MEQ tablet Take 1 tablet (20 mEq total) by mouth daily. 30 tablet 0  . SUMAtriptan (IMITREX) 100 MG tablet Take 50-100 mg by mouth 2 (two) times daily as needed for migraine. May repeat in 2 hours if headache persists or recurs.    . metoprolol tartrate (LOPRESSOR) 25 MG tablet Take 1 tablet (25 mg total) by mouth 2 (two) times daily. 60 tablet 1  . sitaGLIPtin (JANUVIA) 50 MG tablet Take 1 tablet (50 mg total) by mouth daily. 30 tablet 1   No facility-administered medications prior to visit.      Allergies:   Shellfish allergy   Social History   Socioeconomic History  . Marital status: Married    Spouse name: None  . Number of children: 2  . Years of education: None  . Highest education level: None  Social Needs  . Financial resource strain: None  . Food insecurity - worry: None  . Food insecurity - inability: None  . Transportation  needs - medical: None  . Transportation needs - non-medical: None  Occupational History  . Occupation:  Orthoptist with [employer]  . Occupation: was in the [employer]  . Smoking status: Never Smoker  . Smokeless tobacco: Never Used  Substance and Sexual Activity  . Alcohol use: No  . Drug use: No  . Sexual activity: Not Currently  Other Topics Concern  . None  Social History Narrative  . None     Family History:  The patient's family history includes Cervical cancer in her paternal grandmother; Diabetes in her maternal grandmother; Heart disease in her maternal grandfather and maternal grandmother; Kidney disease in her paternal grandmother.   ROS:  Please see the history of present illness.    ROS All other systems reviewed and are negative.   PHYSICAL EXAM:   VS:  BP 94/72   Pulse 85   Ht 5' 1"  (1.549 m)   Wt 208 lb (94.3 kg)   BMI 39.30 kg/m    GEN: Well nourished, well developed, in no acute distress  HEENT: normal  Neck: no JVD, carotid bruits, or masses Cardiac: RRR; no murmurs, rubs, or gallops. Trace LE edema  Respiratory:  +R basilar crackle GI: soft, nontender, nondistended, + BS MS: no deformity or atrophy  Skin: warm and dry, no rash Neuro:  Alert and Oriented x 3, Strength and sensation are intact Psych: euthymic mood, full affect  Wt Readings from Last 3 Encounters:  12/15/16 208 lb (94.3 kg)  12/02/16 218 lb (98.9 kg)  12/24/15 212 lb (96.2 kg)      Studies/Labs Reviewed:   EKG:  EKG is ordered today.  The ekg ordered today demonstrates normal sinus rhythm, diffuse T-wave inversion  Recent Labs: 11/24/2016: ALT 23; TSH 48.374 11/27/2016: Magnesium 2.5 12/15/2016: BUN 9; Creatinine, Ser 0.92; Hemoglobin 11.5; Platelets 667; Potassium 4.2; Sodium 140   Lipid Panel    Component Value Date/Time   CHOL 132 11/23/2016 0324   TRIG 103 11/23/2016 0324   HDL 38 (L) 11/23/2016 0324   CHOLHDL 3.5 11/23/2016 0324   VLDL 21 11/23/2016 0324    LDLCALC 73 11/23/2016 0324    Additional studies/ records that were reviewed today include:   Cath 11/23/2016 Conclusion   1. Diffusely diseased RCA with total occlusion of the distal RCA. This is the patient's culprit vessel for non-STEMI with contrast staining at the occlusion. There are faint left to right collaterals present. 2. Severe proximal LAD stenosis, diffuse segment 3. Severe proximal left circumflex stenosis 4. Mild segmental LV dysfunction with akinesis of the mid inferior wall and normal wall motion through the anterolateral and periapical walls.  Recommendation: We will review revascularization options. This is a diabetic patient with diffuse proximal coronary disease. At her young age, it would be reasonable to consider multivessel PCI. However, I am reluctant about obligating her to dual antiplatelet therapy for the next 12 months in the setting of ulcerative colitis. Will place a formal cardiac surgical consult for CABG and further discussion of best revascularization strategy. Will resume IV heparin after her TR band is out. Will hold on oral antiplatelet therapy other than aspirin in case she is ultimately treated with CABG.    CABG 11/26/2016 PROCEDURE:  Procedure(s) with comments: CORONARY ARTERY BYPASS GRAFTING (CABG), ON PUMP, TIMES FOUR, USING LEFT INTERNAL MAMMARY ARTERY AND ENDOSCOPICALLY HARVESTED RIGHT GREATER SAPHENOUS VEIN (N/A) - LIMA-LAD, SVG-OM, SVG-PL, SVG-DIAG TRANSESOPHAGEAL ECHOCARDIOGRAM (TEE) (N/A) LIMA-LAD SVG-OM SVG-PL SVG-DIAG   Pre CABG dippler 11/25/2016 Final Interpretation: Right Carotid: There is evidence in the right ICA of a 1-39% stenosis.  Left Carotid: There is evidence in the left ICA of a 1-39% stenosis. Vertebrals: Both vertebral arteries were patent with antegrade flow. Subclavians:  Right ABI: Resting right ankle-brachial index is within normal range. No evidence of significant right lower extremity arterial disease. Left  ABI: Resting left ankle-brachial index is within normal range. No evidence of significant left lower extremity arterial disease.    ASSESSMENT:    1. Coronary artery disease involving coronary bypass graft of native heart without angina pectoris   2. Anemia, unspecified type   3. IDDM (insulin dependent diabetes mellitus) ([place])   4. Hypothyroidism,  unspecified type   5. Cough      PLAN:  In order of problems listed above:  1. CAD s/p CABG: doing well after discharge, mild chest soreness, however no significant chest pain with ambulation. Continue aspirin and Plavix along with high-dose statin. Blood pressure soft, decrease metoprolol to 12.5 mg twice a day.  2. Postoperative anemia: Check CBC  3. IDDM: managed by primary care provider, recent labs shows uncontrolled diabetes  4. Hypothyroidism: on Synthroid, management by primary care provider. Recent TSH high  5. Cough: Also has orthopnea symptom along with mild right basilar crackle, I recommended her to be more compliant with Lasix. She will need BMET today and in 1 week    Medication Adjustments/Labs and Tests Ordered: Current medicines are reviewed at length with the patient today.  Concerns regarding medicines are outlined above.  Medication changes, Labs and Tests ordered today are listed in the Patient Instructions below. Patient Instructions  Medication Instructions:  DECREASE Metoprolol to 12.5 mg Take 1 tablet twice a day TAKE Lasix and Potassium Daily  Labwork: Your physician recommends that you return for lab work in: [facility], BMET Your physician recommends that you return for lab work in: 1 [place] (12/20/2016)  Testing/Procedures: none  Follow-Up: Your physician recommends that you schedule a follow-up appointment in: 2-3 MONTHS WITH DR [facility]   Any Other Special Instructions Will Be Listed Below (If Applicable).  If you need a refill on your cardiac medications before your next appointment, please  call your pharmacy.     Hilbert Corrigan, [place]  12/17/2016 8:29 AM    [address], [place], [place]  [address] Phone: [phone]; Fax: [phone]

## 2016-12-16 NOTE — Progress Notes (Signed)
Anemia resolved, red blood cell counter normalized compare last lab 2 weeks ago. Renal function stable. Plan for 1 week repeat BMET as previously instructed during office visit

## 2016-12-17 ENCOUNTER — Telehealth: Payer: Self-pay | Admitting: *Deleted

## 2016-12-17 ENCOUNTER — Encounter: Payer: Self-pay | Admitting: Physician Assistant

## 2016-12-17 NOTE — Telephone Encounter (Signed)
-----   Message from [place], [place] sent at 12/16/2016  5:22 PM EST ----- Anemia resolved, red blood cell counter normalized compare last lab 2 weeks ago. Renal function stable. Plan for 1 week repeat BMET as previously instructed during office visit

## 2016-12-17 NOTE — Telephone Encounter (Signed)
CALLED PATIENT , UNABLE TO SPEAK TO PATIENT . LEFT DETAILED  MESSAGE ON VOICEMAIL. PER EXTENDER KILROY, PA  [place] ORDER-- TAKE IBUPROFEN /ADVIL ,OR MORTIN  AROUND THE CLOCK FOR WEEKEND  - IF PATIENT PAIN INCREASE OR SHORT OF BREATHE  GO THE ER   APPOINTMENT  SCHEDULE FOR 12/20/16  WITH LUKE KILROY PA-- MESSAGE LEFT FOR PATIENT TO CALL IF UNABLE TO MAKE APPOINTMENT.

## 2016-12-17 NOTE — Telephone Encounter (Signed)
Spoke to patient. Result given . Verbalized understanding   IN ADDITION PATIENT STATES SHE HAS BEEN A CONSTANT PAIN IN HER ON THE LEFT SIDE. SHE STATES WHEN SHE BREATHES DEEPLY. FOR THE MOST PART IF SHE TRIES TO SEAT BACK . PATIENT STATES IT STARTED YESTERDAY AND IS PRESENT TODAY. PATIENT STATES SHE TRIED PAIN MEDICATION OXYCODONE SHE ALREADY HAD ON HAND. LAST NIGHT AND TODAY. PATIENT STATES PAIN IS STILL PRESENT.  WILL REVIEW AND CONTACT PATIENT.

## 2016-12-17 NOTE — Telephone Encounter (Signed)
LEFT MESSAGE  TO CALL BACK -- IN REGARDS TO LABS

## 2016-12-20 ENCOUNTER — Ambulatory Visit: Payer: PRIVATE HEALTH INSURANCE | Admitting: Cardiology

## 2016-12-20 DIAGNOSIS — I119 Hypertensive heart disease without heart failure: Secondary | ICD-10-CM | POA: Insufficient documentation

## 2016-12-21 ENCOUNTER — Encounter: Payer: Self-pay | Admitting: *Deleted

## 2016-12-22 ENCOUNTER — Telehealth (HOSPITAL_COMMUNITY): Payer: Self-pay

## 2016-12-22 NOTE — Telephone Encounter (Signed)
Patient called back yesterday in regards to [place] - Patient is aware that she needs to get pre-authorization from the [place] and for [facility] to fax over that authorization before we can move forward with scheduling.

## 2016-12-28 LAB — BASIC METABOLIC PANEL
BUN / CREAT RATIO: 9 (ref 9–23)
BUN: 8 mg/dL (ref 6–24)
CALCIUM: 9.4 mg/dL (ref 8.7–10.2)
CHLORIDE: 103 mmol/L (ref 96–106)
CO2: 22 mmol/L (ref 20–29)
Creatinine, Ser: 0.9 mg/dL (ref 0.57–1.00)
GFR calc non Af Amer: 77 mL/min/{1.73_m2} (ref >59)
GFR, EST AFRICAN AMERICAN: 89 mL/min/{1.73_m2} (ref >59)
GLUCOSE: 195 mg/dL — AB (ref 65–99)
POTASSIUM: 4.1 mmol/L (ref 3.5–5.2)
SODIUM: 140 mmol/L (ref 134–144)

## 2016-12-28 NOTE — Progress Notes (Signed)
Renal function stable. Anemia improved back to normal range.

## 2017-01-03 ENCOUNTER — Ambulatory Visit: Payer: No Typology Code available for payment source | Admitting: Thoracic Surgery (Cardiothoracic Vascular Surgery)

## 2017-01-06 ENCOUNTER — Other Ambulatory Visit: Payer: Self-pay | Admitting: Surgical

## 2017-01-07 ENCOUNTER — Telehealth (HOSPITAL_COMMUNITY): Payer: Self-pay

## 2017-01-07 NOTE — Telephone Encounter (Signed)
Called patient in regards to Cardiac Rehab - Patient is supposed to go back to work 02/14/2017. She is unsure of what work will allow her to do. Patient is going to call Dr and work. If I do not hear from patient by 01/14/2017 then I will follow up.

## 2017-01-14 ENCOUNTER — Other Ambulatory Visit: Payer: Self-pay | Admitting: Thoracic Surgery (Cardiothoracic Vascular Surgery)

## 2017-01-14 DIAGNOSIS — Z951 Presence of aortocoronary bypass graft: Secondary | ICD-10-CM

## 2017-01-17 ENCOUNTER — Other Ambulatory Visit: Payer: Self-pay

## 2017-01-17 ENCOUNTER — Encounter: Payer: Self-pay | Admitting: Physician Assistant

## 2017-01-17 ENCOUNTER — Ambulatory Visit (INDEPENDENT_AMBULATORY_CARE_PROVIDER_SITE_OTHER): Payer: Self-pay | Admitting: Physician Assistant

## 2017-01-17 ENCOUNTER — Ambulatory Visit
Admission: RE | Admit: 2017-01-17 | Discharge: 2017-01-17 | Disposition: A | Discharge status: Home / Self Care | Payer: PRIVATE HEALTH INSURANCE | Source: Ambulatory Visit | Attending: Thoracic Surgery (Cardiothoracic Vascular Surgery) | Admitting: Thoracic Surgery (Cardiothoracic Vascular Surgery)

## 2017-01-17 VITALS — BP 137/81 | HR 73 | Resp 16 | Ht 62.0 in | Wt 207.0 lb

## 2017-01-17 DIAGNOSIS — Z736 Limitation of activities due to disability: Secondary | ICD-10-CM

## 2017-01-17 DIAGNOSIS — I25110 Atherosclerotic heart disease of native coronary artery with unstable angina pectoris: Secondary | ICD-10-CM

## 2017-01-17 DIAGNOSIS — Z951 Presence of aortocoronary bypass graft: Secondary | ICD-10-CM

## 2017-01-17 DIAGNOSIS — K51819 Other ulcerative colitis with unspecified complications: Secondary | ICD-10-CM

## 2017-01-17 DIAGNOSIS — I214 Non-ST elevation (NSTEMI) myocardial infarction: Secondary | ICD-10-CM

## 2017-01-17 MED ORDER — TRAMADOL HCL 50 MG PO TABS: 50.0000 mg | ORAL_TABLET | Freq: Four times a day (QID) | ORAL | 0 refills | Status: DC | PRN

## 2017-01-17 NOTE — Patient Instructions (Addendum)
You may return to driving an automobile as long as you are no longer requiring oral narcotic pain relievers during the daytime.  It would be wise to start driving only short distances during the daylight and gradually increase from there as you feel comfortable.  Make every effort to keep your diabetes under very tight control.  Follow up closely with your primary care physician or endocrinologist and strive to keep their hemoglobin A1c levels as low as possible, preferably near or below 6.0.  The long term benefits of strict control of diabetes are far reaching and critically important for your overall health and survival.  Make every effort to stay physically active, get some type of exercise on a regular basis, and stick to a "heart healthy diet".  The long term benefits for regular exercise and a healthy diet are critically important to your overall health and wellbeing.  DO NOT TAKE NSAIDS.   Please call our office if you have any issues getting your Plavix prescription refilled.  F/u in 2 weeks

## 2017-01-17 NOTE — Progress Notes (Signed)
[name] [name] [name] [name] [name] is a 46 y.o. female [name] [name] [name] status post coronary bypass grafting x4 who presents today for routine follow-up visit.   1. NSTEMI (non-ST elevated myocardial infarction) ([place])   2. Coronary artery disease involving native coronary artery of native heart with unstable angina pectoris ([place])   3. S/P CABG x 4    Past Medical History:  Diagnosis Date  . Allergic rhinitis   . Anemia   . Arthritis    "mostly in my hands" (11/23/2016)  . Borderline high cholesterol   . Coronary artery disease   . Endometriosis   . Fibromyalgia   . GERD (gastroesophageal reflux disease)   . Grave's disease    S/P irradiation  . Hypothyroid    S/P radiation  . Left sided ulcerative colitis ([place])   . Migraine headache    "~ 4/month" (11/23/2016)  . NSTEMI (non-ST elevated myocardial infarction) ([place]) 11/22/2016  . 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 Sleep apnea    "couldn't tolerate [name] [name] mask; only had a slight case of it" (11/23/2016)  . Type II diabetes mellitus ([place])   . Vasculitis ([place])    No past surgical history pertinent negatives on file. Scheduled Meds: Current Outpatient Medications on File Prior to Visit  Medication Sig Dispense Refill  . aspirin EC 81 MG EC tablet Take 1 tablet (81 mg total) by mouth daily.    Marland Kitchen atorvastatin (LIPITOR) 80 MG tablet Take 1 tablet (80 mg total) by mouth daily at 6 PM. 30 tablet 1  . clopidogrel (PLAVIX) 75 MG tablet Take 1 tablet (75 mg total) by mouth daily. 30 tablet 1  . furosemide (LASIX) 40 MG tablet Take 1 tablet (40 mg total) by mouth daily. 30 tablet 0  . insulin glargine (LANTUS) 100 UNIT/ML injection Inject 0.25 mLs (25 Units total) into [name] [name] skin at bedtime. 10 mL 11  . levothyroxine (SYNTHROID) 100 MCG tablet Take 1 tablet (100 mcg total) by mouth daily before breakfast. 30 tablet 1  . metFORMIN (FORTAMET) 1000 MG (OSM) 24 hr tablet Take 1 tablet  (1,000 mg total) by mouth 2 (two) times daily with a meal. 60 tablet 1  . metoprolol tartrate (LOPRESSOR) 25 MG tablet Take 0.5 tablets (12.5 mg total) 2 (two) times daily by mouth. 30 tablet 1  . omeprazole (PRILOSEC) 20 MG capsule Take 2 tablets by mouth once daily    . potassium chloride SA (K-DUR,KLOR-CON) 20 MEQ tablet Take 1 tablet (20 mEq total) by mouth daily. 30 tablet 0  . SUMAtriptan (IMITREX) 100 MG tablet Take 50-100 mg by mouth 2 (two) times daily as needed for migraine. May repeat in 2 hours if headache persists or recurs.    Marland Kitchen oxyCODONE (OXY IR/ROXICODONE) 5 MG immediate release tablet Take 1-2 tablets (5-10 mg total) by mouth every 6 (six) hours as needed for severe pain. ([name] [name] [name] not taking: Reported on 01/17/2017) 30 tablet 0   No current facility-administered medications on file prior to visit.     Allergies  Allergen Reactions  . Shellfish Allergy Hives and Swelling    And hives    Blood pressure 137/81, pulse 73, resp. rate 16, height 5' 2"  (1.575 m), weight 207 lb (93.9 kg), SpO2 98 %.  Subjective: [name] [name] [name] [name] [name] presents today for her 4-week follow-up appointment.  She is status post CABG x4.  Overall she is  doing well.  She does complain of some fatigue from time to time.  She feels overwhelmed at this time due to multiple complaints for [name] [name] visit.  She has had chest pain and she is worried that she cannot decipher between incisional versus cardiac chest pain.  She saw her cardiologist which encouraged her that it is only incisional chest pain.  Objective: MBW:GYKZ,LD murmur Pulm: CTA bilaterally [id number]:[id number], no tenderness Wound: c/d/i without drainage  Assessment & Plan  [name] [name] [name] [name] [name] brought her back in 2 weeks  presents today for her 4-week follow-up appointment.  She overall has been feeling okay, however she feels that she should be further along at this point than she is.  She still gets fatigued from time to time and has not been able to resume her usual level of  activity.  She has been taking her Lasix intermittently and has been taking ibuprofen for her headaches and incisional pain.  I strongly recommended that she not take any more ibuprofen and continue her sumatriptan for migraines.  I wrote her a prescription for tramadol to take for pain so that she no longer feels [name] [name] need to use NSAIDs.  She has had a lot of anxiety because she does not want another event to occur with her heart.  I encouraged her that most of her pain is likely due to [name] [name] surgery.  I did inform her that if she has sudden chest pain that is severe with accompanied shortness of breath that she is to report directly to [name] [name] emergency department and be treated.  She has been off her Plavix because her insurance refuses to approve [name] [name] medication.  I had a conversation with her pharmacy today and they encouraged me that she should be able to fill her prescription at [facility].  I communicated this to [name] [name] [name] [name] [name] via phone call and left a message on her cell phone.  She is to call our office if she has problems getting her Plavix prescription filled since this is extremely important in [name] [name] postoperative period.  She is okay to drive an automobile since she is no longer taking narcotic pain medication.  She also complains of some tenderness on [name] [name] back of her left arm which could be due to a variety of things.  She has been taking her blood glucose level in that area and it could be sore from [name] [name] repeated insult.  She also has some soreness under her left breast which is most likely from [name] [name] surgery.  When I pushed around her incision she did have quite a bit of pain.  Her incision overall is healing very well with a small keloid.  She has had a few episodes of hypoglycemia over [name] [name] last few weeks in [name] [name] morning with CBGs lower than 40.  I encouraged her to discuss this with her endocrinologist.  She is only on metformin and Lantus at this time.  She does also recall a few high CBGs after  meals.  This needs to be addressed as soon as possible since tight glycemic control is imperative in [name] [name] perioperative period.  She is to follow-up with her PCP if her left arm pain does not resolve.  I suggested heat therapy in [name] [name] meantime for relief.  Due to her multiple complaints during [name] [name] visit I scheduled a repeat follow-up appointment in 2 weeks to assess [name] [name] progress of [name] [name] [name] [name] [name].  She is to call our office if she has any questions or concerns in [name] [name] meantime.  Elgie Collard 01/17/2017

## 2017-01-18 ENCOUNTER — Telehealth (HOSPITAL_COMMUNITY): Payer: Self-pay

## 2017-01-18 NOTE — Telephone Encounter (Signed)
Called patient to follow up with patient in regards to Cardiac Rehab - Patient is waiting to see what her Dr says in regards to pushing her start date for work back. Will follow up in 2 weeks.

## 2017-01-19 ENCOUNTER — Encounter: Payer: Self-pay | Admitting: Physician Assistant

## 2017-01-31 ENCOUNTER — Encounter: Payer: Self-pay | Admitting: Physician Assistant

## 2017-01-31 ENCOUNTER — Ambulatory Visit (INDEPENDENT_AMBULATORY_CARE_PROVIDER_SITE_OTHER): Payer: Self-pay | Admitting: Physician Assistant

## 2017-01-31 ENCOUNTER — Other Ambulatory Visit: Payer: Self-pay

## 2017-01-31 VITALS — BP 107/78 | HR 84 | Ht 61.0 in | Wt 206.0 lb

## 2017-01-31 DIAGNOSIS — Z951 Presence of aortocoronary bypass graft: Secondary | ICD-10-CM

## 2017-01-31 NOTE — Patient Instructions (Signed)
Make every effort to keep your diabetes under very tight control.  Follow up closely with your primary care physician or endocrinologist and strive to keep their hemoglobin A1c levels as low as possible, preferably near or below 6.0.  The long term benefits of strict control of diabetes are far reaching and critically important for your overall health and survival. Her pre op HGA1C was 9.4. In addition, her TSH was 48.374 and her free T4 was 0.51.  You are encouraged to enroll and participate in the outpatient cardiac rehab program beginning as soon as practical.  You may return to driving an automobile as long as you are no longer requiring oral narcotic pain relievers during the daytime.  It would be wise to start driving only short distances during the daylight and gradually increase from there as you feel comfortable.  Continue to avoid any heavy lifting or strenuous use of your arms or shoulders for at least a total of three months from the time of surgery.  After three months, you may gradually increase how much you lift or otherwise use your arms or chest as tolerated, with limits based upon whether or not activities lead to the return of significant discomfort.

## 2017-01-31 NOTE — Progress Notes (Signed)
HPI:  Patient was seen in routine follow up on 01/17/2017 by Nicholes Rough PA-C after undergoing a CABG x 4 on 11/26/2016 by Dr. Roxy Manns. She asked the patient to return in follow up today, as she had multiple complaints. Patient states she still has sternal incision tenderness but is slowly getting better. The numbness on the left side of her sternum is also improving. She states she does get short of breath with using stairs.  Current Outpatient Medications  Medication Sig Dispense Refill  . aspirin EC 81 MG EC tablet Take 1 tablet (81 mg total) by mouth daily.    Marland Kitchen atorvastatin (LIPITOR) 80 MG tablet Take 1 tablet (80 mg total) by mouth daily at 6 PM. 30 tablet 1  . clopidogrel (PLAVIX) 75 MG tablet Take 1 tablet (75 mg total) by mouth daily. 30 tablet 1  . furosemide (LASIX) 40 MG tablet Take 1 tablet (40 mg total) by mouth daily. 30 tablet 0  . insulin glargine (LANTUS) 100 UNIT/ML injection Inject 0.25 mLs (25 Units total) into the skin at bedtime. 10 mL 11  . levothyroxine (SYNTHROID) 100 MCG tablet Take 1 tablet (100 mcg total) by mouth daily before breakfast. 30 tablet 1  . metFORMIN (FORTAMET) 1000 MG (OSM) 24 hr tablet Take 1 tablet (1,000 mg total) by mouth 2 (two) times daily with a meal. 60 tablet 1  . metoprolol tartrate (LOPRESSOR) 25 MG tablet Take 0.5 tablets (12.5 mg total) 2 (two) times daily by mouth. 30 tablet 1  . omeprazole (PRILOSEC) 20 MG capsule Take 2 tablets by mouth once daily    . oxyCODONE (OXY IR/ROXICODONE) 5 MG immediate release tablet Take 1-2 tablets (5-10 mg total) by mouth every 6 (six) hours as needed for severe pain. (Patient not taking: Reported on 01/17/2017) 30 tablet 0  . potassium chloride SA (K-DUR,KLOR-CON) 20 MEQ tablet Take 1 tablet (20 mEq total) by mouth daily. 30 tablet 0  . SUMAtriptan (IMITREX) 100 MG tablet Take 50-100 mg by mouth 2 (two) times daily as needed for migraine. May repeat in 2 hours if headache persists or recurs.    . traMADol  (ULTRAM) 50 MG tablet Take 1 tablet (50 mg total) by mouth every 6 (six) hours as needed. 20 tablet 0  Vital Signs: BP 107/78, HR 84, Oxygenation 99% on room air   Physical Exam: CV-RRR Pulmonary-Clear to auscultation bilaterally Extremities-No LE edema Wounds-Clean and dry. Sternum is stable.  Impression and Plan: Overall, she is slowly making progress recovering from coronary artery bypass grafting surgery. She is still using Tramadol, but less frequently. She states she was able to fill her Plavix prescription through the [place]. She also states she is only taking Lasix 40 mg daily PRN. She has seen the [place] in follow up. She states her HGA1C is now down to 8.4 and her thyroid is supposed to be checked on her next visit. She is already driving. She was instructed to continue with sternal precautions (I.e. No lifting more than 10 pounds) for 2 more weeks but then may resume normal activities. She wishes to participate in cardiac rehab and is going to do so at [facility]. She requested a letter to not return to work until the end of February, which she was given. 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 She already has an appointment to see Dr. Ellyn Hack 02/23/2017 at 2:40 pm. She will return to see Dr. Roxy Manns in 2 weeks, at her request.  Nani Skillern, PA-C [facility] [phone]

## 2017-02-02 ENCOUNTER — Telehealth (HOSPITAL_COMMUNITY): Payer: Self-pay

## 2017-02-02 NOTE — Telephone Encounter (Signed)
Attempted to call patient to follow up in regards to her work schedule to participate in Cardiac Rehab - Lm on Vm.

## 2017-02-11 ENCOUNTER — Telehealth (HOSPITAL_COMMUNITY): Payer: Self-pay

## 2017-02-11 NOTE — Telephone Encounter (Signed)
2nd attempt to call patient in regards to Cardiac Rehab - lm on vm. Closing referral. Patient paperwork placed in file cabinet.

## 2017-02-17 ENCOUNTER — Ambulatory Visit: Payer: Self-pay | Admitting: Thoracic Surgery (Cardiothoracic Vascular Surgery)

## 2017-02-18 ENCOUNTER — Other Ambulatory Visit: Payer: Self-pay

## 2017-02-18 ENCOUNTER — Ambulatory Visit (INDEPENDENT_AMBULATORY_CARE_PROVIDER_SITE_OTHER): Payer: PRIVATE HEALTH INSURANCE | Admitting: Thoracic Surgery (Cardiothoracic Vascular Surgery)

## 2017-02-18 ENCOUNTER — Encounter: Payer: Self-pay | Admitting: Thoracic Surgery (Cardiothoracic Vascular Surgery)

## 2017-02-18 VITALS — BP 116/82 | HR 90 | Resp 18 | Ht 61.0 in | Wt 201.8 lb

## 2017-02-18 DIAGNOSIS — Z951 Presence of aortocoronary bypass graft: Secondary | ICD-10-CM

## 2017-02-18 NOTE — Progress Notes (Signed)
[address]       [place] [address]             [phone]     CARDIOTHORACIC SURGERY OFFICE NOTE  Referring Provider is Leonie Man, MD PCP is [facility]   HPI:  Patient has 47 year old obese African-American female with coronary artery disease, insulin-dependent type 2 diabetes mellitus, hyperlipidemia, and chronic immunosuppression who returns the office today for routine follow-up nearly 3 months status post coronary artery bypass grafting times 4 on November 26, 2016 for severe multivessel coronary artery disease status post acute non-ST segment elevation myocardial infarction.  The patient's early postoperative recovery in the hospital was uneventful and she was discharged home on the sixth postoperative day.  Since hospital discharge she has been seen in our office for routine follow-up on several occasions, most recently on January 31, 2017.  The patient has been seen on one occasion by Almyra Deforest at [facility] on December 15, 2016.  She has not yet been seen in follow-up by Dr. Ellyn Hack.  She returns her office today and reports that she continues to make slow but steady progress.  She still has not started the outpatient cardiac rehab program, but she is scheduled to start next week.  She states that for the first few weeks after hospital discharge she made an effort to go for a walk every day, but she stopped doing this in early December.  She has no explanation as to why.  She reports that her blood sugars have been under better control but her most recent hemoglobin A1c was still greater than 8.  She states that the soreness in her chest has almost completely resolved but it still feels sensitive and there is some numbness along the left side of the breast bone.  She states that she still gets tired easily, but she admits that she really does not do very much physically.  She denies any symptoms of exertional chest pain or chest tightness  suspicious for angina pectoris.  The remainder of her review of systems is unremarkable.   Current Outpatient Medications  Medication Sig Dispense Refill  . aspirin EC 81 MG EC tablet Take 1 tablet (81 mg total) by mouth daily.    Marland Kitchen atorvastatin (LIPITOR) 80 MG tablet Take 1 tablet (80 mg total) by mouth daily at 6 PM. 30 tablet 1  . clopidogrel (PLAVIX) 75 MG tablet Take 1 tablet (75 mg total) by mouth daily. 30 tablet 1  . insulin glargine (LANTUS) 100 UNIT/ML injection Inject 0.25 mLs (25 Units total) into the skin at bedtime. 10 mL 11  . levothyroxine (SYNTHROID) 100 MCG tablet Take 1 tablet (100 mcg total) by mouth daily before breakfast. 30 tablet 1  . metFORMIN (FORTAMET) 1000 MG (OSM) 24 hr tablet Take 1 tablet (1,000 mg total) by mouth 2 (two) times daily with a meal. 60 tablet 1  . metoprolol tartrate (LOPRESSOR) 25 MG tablet Take 0.5 tablets (12.5 mg total) 2 (two) times daily by mouth. 30 tablet 1  . omeprazole (PRILOSEC) 20 MG capsule Take 2 tablets by mouth once daily    . SUMAtriptan (IMITREX) 100 MG tablet Take 50-100 mg by mouth 2 (two) times daily as needed for migraine. May repeat in 2 hours if headache persists or recurs.    . traMADol (ULTRAM) 50 MG tablet Take 1 tablet (50 mg total) by mouth every 6 (six) hours as needed. 20 tablet 0  No current facility-administered medications for this visit.       Physical Exam:   BP 116/82 (BP Location: Right Arm, Patient Position: Sitting, Cuff Size: Large)   Pulse 90   Resp 18   Ht 5' 1"  (1.549 m)   Wt 201 lb 12.8 oz (91.5 kg)   SpO2 98% Comment: RA  BMI 38.13 kg/m   General:  Obese but well-appearing  Chest:   Clear to auscultation  CV:   Regular rate and rhythm without murmur  Incisions:  Well-healed, sternum is stable  Abdomen:  Soft nontender  Extremities:  Warm and well perfused  Diagnostic Tests:  n/a   Impression:  Patient is doing well nearly 3 months status post coronary artery bypass grafting.  She  needs to change her living habits and include some physical activity and better dietary management into her lifestyle.  Plan:  We have not recommended any changes to the patient's current medications.  The patient is counseled regarding the many benefits of weight loss, regular exercise, heart healthy diet, and diabetes control.  All of her questions have been addressed.  She will return to our office next fall for routine follow-up approximately 1 year following her surgery.  During the interim she will call and return only should specific problems or questions arise.    Valentina Gu. Roxy Manns, MD 02/18/2017 9:19 AM

## 2017-02-18 NOTE — Progress Notes (Signed)
Not sure why she has not been seen in Cards clinic.  Will send note to schedulers. Glenetta Hew, MD

## 2017-02-18 NOTE — Patient Instructions (Addendum)
Continue all previous medications without any changes at this time  Make every effort to keep your diabetes under very tight control.  Follow up closely with your primary care physician or endocrinologist and strive to keep their hemoglobin A1c levels as low as possible, preferably near or below 6.0.  The long term benefits of strict control of diabetes are far reaching and critically important for your overall health and survival.  Make every effort to stay physically active, get some type of exercise on a regular basis, and stick to a "heart healthy diet".  The long term benefits for regular exercise and a healthy diet are critically important to your overall health and wellbeing.

## 2017-02-23 ENCOUNTER — Encounter: Payer: Self-pay | Admitting: Cardiology

## 2017-02-23 ENCOUNTER — Ambulatory Visit: Payer: PRIVATE HEALTH INSURANCE | Admitting: Cardiology

## 2017-02-23 VITALS — BP 100/56 | HR 89 | Ht 61.0 in | Wt 200.0 lb

## 2017-02-23 DIAGNOSIS — I119 Hypertensive heart disease without heart failure: Secondary | ICD-10-CM | POA: Diagnosis not present

## 2017-02-23 DIAGNOSIS — E785 Hyperlipidemia, unspecified: Secondary | ICD-10-CM

## 2017-02-23 DIAGNOSIS — I214 Non-ST elevation (NSTEMI) myocardial infarction: Secondary | ICD-10-CM | POA: Diagnosis not present

## 2017-02-23 DIAGNOSIS — Z951 Presence of aortocoronary bypass graft: Secondary | ICD-10-CM

## 2017-02-23 DIAGNOSIS — Z794 Long term (current) use of insulin: Secondary | ICD-10-CM

## 2017-02-23 DIAGNOSIS — IMO0001 Reserved for inherently not codable concepts without codable children: Secondary | ICD-10-CM

## 2017-02-23 DIAGNOSIS — I2510 Atherosclerotic heart disease of native coronary artery without angina pectoris: Secondary | ICD-10-CM

## 2017-02-23 DIAGNOSIS — E119 Type 2 diabetes mellitus without complications: Secondary | ICD-10-CM | POA: Diagnosis not present

## 2017-02-23 MED ORDER — FUROSEMIDE 20 MG PO TABS: 20.0000 mg | ORAL_TABLET | ORAL | 3 refills | Status: DC

## 2017-02-23 MED ORDER — NITROGLYCERIN 0.4 MG SL SUBL: 0.4000 mg | SUBLINGUAL_TABLET | SUBLINGUAL | 6 refills | Status: DC | PRN

## 2017-02-23 NOTE — Patient Instructions (Signed)
MEDICATION INSTRUCTIONS  TAKE FUROSEMIDE 20 MG  THREE TIMES A WEEK , IF YOU WAKE UP SHORT OF BREATHE OR HAVE DIFFICULTY THROUGH THE NIGHT TAKE 20 MG FUROSEMIDE THE NEXT MORNING , IF IT IS TH DAY TO TAKE YOU  DOSE OF FUROSEMIDE THAT WEEK T TAKE   YOUR REGULAR DOSE PLUS AN ADDITIONAL TABLET.     MAY USE NITROGLYCERIN SUBLINGUAL TABLETS IF NEED FOR CHEST DISCOMFORT. ONE TABLET UNDER YOU TONGUE EVERY FIVE MINUTE UP TO 3 TIMES WITH EACH EPISODES.     Your physician recommends that you schedule a follow-up appointment in 3 TO [place].   If you need a refill on your cardiac medications before your next appointment, please call your pharmacy.      Nitroglycerin sublingual tablets What is this medicine? NITROGLYCERIN (nye troe GLI ser in) is a type of vasodilator. It relaxes blood vessels, increasing the blood and oxygen supply to your heart. This medicine is used to relieve chest pain caused by angina. It is also used to prevent chest pain before activities like climbing stairs, going outdoors in cold weather, or sexual activity. This medicine may be used for other purposes; ask your health care provider or pharmacist if you have questions. COMMON BRAND NAME(S): Nitroquick, Nitrostat, Nitrotab What should I tell my health care provider before I take this medicine? They need to know if you have any of these conditions: -anemia -head injury, recent stroke, or bleeding in the brain -liver disease -previous heart attack -an unusual or allergic reaction to nitroglycerin, other medicines, foods, dyes, or preservatives -pregnant or trying to get pregnant -breast-feeding How should I use this medicine? Take this medicine by mouth as needed. At the first sign of an angina attack (chest pain or tightness) place one tablet under your tongue. You can also take this medicine 5 to 10 minutes before an event likely to produce chest pain. Follow the directions on the prescription label.  Let the tablet dissolve under the tongue. Do not swallow whole. Replace the dose if you accidentally swallow it. It will help if your mouth is not dry. Saliva around the tablet will help it to dissolve more quickly. Do not eat or drink, smoke or chew tobacco while a tablet is dissolving. If you are not better within 5 minutes after taking ONE dose of nitroglycerin, call 9-1-1 immediately to seek emergency medical care. Do not take more than 3 nitroglycerin tablets over 15 minutes. If you take this medicine often to relieve symptoms of angina, your doctor or health care professional may provide you with different instructions to manage your symptoms. If symptoms do not go away after following these instructions, it is important to call 9-1-1 immediately. Do not take more than 3 nitroglycerin tablets over 15 minutes. Talk to your pediatrician regarding the use of this medicine in children. Special care may be needed. Overdosage: If you think you have taken too much of this medicine contact a poison control center or emergency room at once. NOTE: This medicine is only for you. Do not share this medicine with others. What if I miss a dose? This does not apply. This medicine is only used as needed. What may interact with this medicine? Do not take this medicine with any of the following medications: -certain migraine medicines like ergotamine and dihydroergotamine (DHE) -medicines used to treat erectile dysfunction like sildenafil, tadalafil, and vardenafil -riociguat This medicine may also interact with the following medications: -alteplase -aspirin -heparin -medicines for high  blood pressure -medicines for mental depression -other medicines used to treat angina -phenothiazines like chlorpromazine, mesoridazine, prochlorperazine, thioridazine This list may not describe all possible interactions. Give your health care provider a list of all the medicines, herbs, non-prescription drugs, or dietary  supplements you use. Also tell them if you smoke, drink alcohol, or use illegal drugs. Some items may interact with your medicine. What should I watch for while using this medicine? Tell your doctor or health care professional if you feel your medicine is no longer working. Keep this medicine with you at all times. Sit or lie down when you take your medicine to prevent falling if you feel dizzy or faint after using it. Try to remain calm. This will help you to feel better faster. If you feel dizzy, take several deep breaths and lie down with your feet propped up, or bend forward with your head resting between your knees. You may get drowsy or dizzy. Do not drive, use machinery, or do anything that needs mental alertness until you know how this drug affects you. Do not stand or sit up quickly, especially if you are an older patient. This reduces the risk of dizzy or fainting spells. Alcohol can make you more drowsy and dizzy. Avoid alcoholic drinks. Do not treat yourself for coughs, colds, or pain while you are taking this medicine without asking your doctor or health care professional for advice. Some ingredients may increase your blood pressure. What side effects may I notice from receiving this medicine? Side effects that you should report to your doctor or health care professional as soon as possible: -blurred vision -dry mouth -skin rash -sweating -the feeling of extreme pressure in the head -unusually weak or tired Side effects that usually do not require medical attention (report to your doctor or health care professional if they continue or are bothersome): -flushing of the face or neck -headache -irregular heartbeat, palpitations -nausea, vomiting This list may not describe all possible side effects. Call your doctor for medical advice about side effects. You may report side effects to FDA at 1-800-FDA-1088. Where should I keep my medicine? Keep out of the reach of children. Store at  room temperature between 20 and 25 degrees C (68 and 77 degrees F). Store in [employer]. Protect from light and moisture. Keep tightly closed. Throw away any unused medicine after the expiration date. NOTE: This sheet is a summary. It may not cover all possible information. If you have questions about this medicine, talk to your doctor, pharmacist, or health care provider.  2018 Elsevier/Gold Standard (2012-11-16 17:57:36)

## 2017-02-23 NOTE — Progress Notes (Signed)
PCP: [facility]  --> She is an employee of [employer], and was hoping to be followed up by cardiologist at [facility] Note: Chief Complaint  Patient presents with  . Follow-up    2 months  . Coronary Artery Disease    h/o NSTEMI - MV CAD --> CABG (11/2016)  . Chest Pain    Scar is sore to the touch but patient has had chest pains within the past few days.    HPI: [name] [name] [name] is a 47 y.o. female with a PMH below who presents [name] [name] for 35-monthfollow-up for CAD-CABG. She was admitted in October 2018 with chest pain concerning for an anginal presentation.  She ruled in for non-STEMI and underwent cardiac catheterization revealing totally occluded distal RCA with diffusely diseased RCA upstream and severe proximal LAD disease as well as proximal circumflex disease.  After much deliberation, we determined that she was probably best suited for CABG and underwent four-vessel CABG on October 26 with Dr. ORoxy Manns  [name] [name] [name] seen on December 15, 2016 by [name] [name] [name] for her postop/post hospital follow-up.  She was doing relatively well except having some cough and orthopnea symptoms.  She was urged to restart her daily Lasix.  She was then last seen by Dr. ORoxy Manns--she noted slow steady progress but had not yet to start cardiac rehab.  Recent Hospitalizations: None since her hospitalization for non-STEMI and then CABG  Studies Personally Reviewed - (if available, images/films reviewed: From Epic Chart or Care Everywhere)  Cardiac Cath November 23, 2016: Diffuse RCA disease with total occlusion of the distal RCA (likely culprit lesion for non-STEMI).  Severe proximal LAD disease with tandem 70% lesions along with 70% D1.  Also severe diffuse 80-50% proximal circumflex.  Mild LV dysfunction with akinesis in the mid inferior wall through the anterolateral and periapical walls. -->  Recommendation was CABG (partially related to her history of  ulcerative colitis)  Intra-Op TEE November 26, 2016: EF 50-55% with GRII DD.  Mild mitral valve leaflet thickening.  Trace TR.  Interval History: [name] [name] [name] having finally started cardiac rehab.  She is doing relatively well with her cardiac rehab, and has not noticed any chest pain associated with the rehab besides with certain movements she has some muscular skeletal pain which she does feel however is the dull aching sensation along her left breast from the LIMA excision.  She also has some sharp pains along her sternal wound.  She denies having any of her pre- CABG non-STEMI type anginal pain with rest or exertion.  She has some mild swelling in her feet at the end of the day, but no significant PND or orthopnea. She does get a little bit short of breath when she is walking around, but that has gotten better the more she is been active.  No PND, orthopnea or edema. No palpitations, lightheadedness, dizziness, weakness or syncope/near syncope. No TIA/amaurosis fugax symptoms. No melena, hematochezia, hematuria, or epstaxis. No claudication.  ROS: A comprehensive was performed. Review of Systems  Constitutional: Negative for malaise/fatigue.  HENT: Negative for congestion and nosebleeds.   Respiratory: Negative for cough, shortness of breath and wheezing.   Gastrointestinal: Negative for constipation and diarrhea.       No recent UC flares  Genitourinary: Negative for dysuria.  Musculoskeletal: Negative for joint pain and myalgias.  Neurological: Negative for dizziness.  Psychiatric/Behavioral: Negative for depression and memory loss. The patient is  nervous/anxious. The patient does not have insomnia.   All other systems reviewed and are negative.   I have reviewed and (if needed) personally updated the patient's problem list, medications, allergies, past medical and surgical history, social and family history.   Past Medical History:  Diagnosis Date  . Allergic rhinitis   .  Anemia   . Arthritis    "mostly in my hands" (11/23/2016)  . Borderline high cholesterol   . 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  Severe 75-80% proximal LAD and proximal Circumflex disease. --Referred for CABG  . Endometriosis   . Fibromyalgia   . GERD (gastroesophageal reflux disease)   . Grave's disease    S/P irradiation  . Hypothyroid    S/P radiation  . Left sided ulcerative colitis ([place])   . Migraine headache    "~ 4/month" (11/23/2016)  . NSTEMI (non-ST elevated myocardial infarction) ([place]) 11/22/2016  . 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 Sleep apnea    "couldn't tolerate the mask; only had a slight case of it" (11/23/2016)  . Type II diabetes mellitus ([place])   . Vasculitis [facility])     Past Surgical History:  Procedure Laterality Date  . ABDOMINAL HYSTERECTOMY  07/2006   "Davinci; partial"  . CESAREAN SECTION  1994  . COLONOSCOPY W/ BIOPSIES AND POLYPECTOMY  03/2008- 2017 X "4 at least"  . CORONARY ARTERY BYPASS GRAFT N/A 11/26/2016   Procedure: CORONARY ARTERY BYPASS GRAFTING (CABG), ON PUMP, TIMES FOUR, USING LEFT INTERNAL MAMMARY ARTERY AND ENDOSCOPICALLY HARVESTED RIGHT GREATER SAPHENOUS VEIN;  Surgeon: Rexene Alberts, MD;  Location: [facility];  Service: Open Heart Surgery;  Laterality: N/A;  LIMA-LAD, SVG-OM, SVG-PL, SVG-DIAG  . DILATION AND CURETTAGE OF UTERUS  1996  . LEFT HEART CATH AND CORONARY ANGIOGRAPHY N/A 11/23/2016   Procedure: LEFT HEART CATH AND CORONARY ANGIOGRAPHY;  Surgeon: Sherren Mocha, MD;  Location: [facility];  Service: Cardiovascular:  Diffuse proximal RCA with 100% occluded distal RCA.  Severe 75-80% proximal LAD and proximal Circumflex disease. --Referred for CABG  . TEE WITHOUT CARDIOVERSION N/A 11/26/2016   Procedure: TRANSESOPHAGEAL ECHOCARDIOGRAM (TEE);  Surgeon: Rexene Alberts, MD;  Location: [facility];   Service: Open Heart Surgery;  Laterality: N/A;    Current Meds  Medication Sig  . aspirin EC 81 MG EC tablet Take 1 tablet (81 mg total) by mouth daily.  Marland Kitchen atorvastatin (LIPITOR) 80 MG tablet Take 1 tablet (80 mg total) by mouth daily at 6 PM.  . clopidogrel (PLAVIX) 75 MG tablet Take 1 tablet (75 mg total) by mouth daily.  . insulin glargine (LANTUS) 100 UNIT/ML injection Inject 0.25 mLs (25 Units total) into the skin at bedtime.  Marland Kitchen levothyroxine (SYNTHROID) 100 MCG tablet Take 1 tablet (100 mcg total) by mouth daily before breakfast.  . metFORMIN (FORTAMET) 1000 MG (OSM) 24 hr tablet Take 1 tablet (1,000 mg total) by mouth 2 (two) times daily with a meal.  . metoprolol tartrate (LOPRESSOR) 25 MG tablet Take 0.5 tablets (12.5 mg total) 2 (two) times daily by mouth.  Marland Kitchen omeprazole (PRILOSEC) 20 MG capsule Take 2 tablets by mouth once daily  . SUMAtriptan (IMITREX) 100 MG tablet Take 50-100 mg by mouth 2 (two) times daily as needed for migraine. May repeat in 2 hours if headache persists  or recurs.    Allergies  Allergen Reactions  . Shellfish Allergy Hives and Swelling    And hives    Social History   Tobacco Use  . Smoking status: Never Smoker  . Smokeless tobacco: Never Used  Substance Use Topics  . Alcohol use: No  . Drug use: No   Social History   Social History Narrative  . Not on file    family history includes Cervical cancer in her paternal grandmother; Diabetes in her maternal grandmother; Heart disease in her maternal grandfather and maternal grandmother; Kidney disease in her paternal grandmother.  Wt Readings from Last 3 Encounters:  02/23/17 200 lb (90.7 kg)  02/18/17 201 lb 12.8 oz (91.5 kg)  01/31/17 206 lb (93.4 kg)    PHYSICAL EXAM BP (!) 100/56   Pulse 89   Ht 5' 1"  (1.549 m)   Wt 200 lb (90.7 kg)   BMI 37.79 kg/m  Physical Exam  Constitutional: She is oriented to person, place, and time. She appears well-developed and well-nourished. No distress.   Moderate to severely obese.  Well-groomed  Eyes: EOM are normal. No scleral icterus.  Neck: No hepatojugular reflux and no JVD present. Carotid bruit is not present.  Cardiovascular: Normal rate, regular rhythm, normal heart sounds and intact distal pulses.  No extrasystoles are present. PMI is not displaced. Exam reveals no gallop and no friction rub.  No murmur heard. Pulmonary/Chest: Effort normal and breath sounds normal. No respiratory distress. She has no wheezes. She has no rales.  Tenderness to palpation along sternal border (sternal scar)  Abdominal: Soft. Bowel sounds are normal. She exhibits no distension. There is no tenderness. There is no rebound.  Musculoskeletal: Normal range of motion. She exhibits edema (trace).  Neurological: She is alert and oriented to person, place, and time.  Skin: Skin is warm and dry.  Psychiatric: She has a normal mood and affect. Her behavior is normal. Judgment and thought content normal.  Vitals reviewed.    Adult ECG Report  Rate: 87;  Rhythm: normal sinus rhythm and Left atrial enlargement.  Inferior MI, age undetermined.  Cannot rule out anterior MI, age undetermined (poor R wave progression).;   Narrative Interpretation: Stable EKG   Other studies Reviewed: Additional studies/ records that were reviewed [name] [name] include:  Recent Labs:   Lab Results  Component Value Date   CREATININE 0.90 12/27/2016   BUN 8 12/27/2016   NA 140 12/27/2016   K 4.1 12/27/2016   CL 103 12/27/2016   CO2 22 12/27/2016   Lab Results  Component Value Date   CHOL 132 11/23/2016   HDL 38 (L) 11/23/2016   LDLCALC 73 11/23/2016   TRIG 103 11/23/2016   CHOLHDL 3.5 11/23/2016   Lab Results  Component Value Date   HGBA1C 9.4 (H) 11/24/2016    ASSESSMENT / PLAN: Problem List Items Addressed This Visit    CAD, multiple vessel - Primary (Chronic)    Doing relatively well status post CABG.  Finally now starting to go to cardiac rehab.  I encouraged her to  continue to keep this up. No active anginal symptoms.  She is not on much in the way of medications:   Continue aspirin and Plavix (due to recent non-STEMI),   Continue beta-blocker and high-dose statin along  If she continues to have her strange sounding chest discomfort over the course of this next several months before I see her back, we will have a low threshold for checking a new  baseline stress test just to exclude true ischemic pain.      Relevant Medications   furosemide (LASIX) 20 MG tablet   nitroGLYCERIN (NITROSTAT) 0.4 MG SL tablet   Other Relevant Orders   EKG 12-Lead (Completed)   Dyslipidemia, goal LDL below 70 (Chronic)    LDL as of October was pretty well controlled.  I think this is being managed by her diabetes physician. She is on a decent dose of statin, and borderline goal.  Continue to follow.      Relevant Medications   furosemide (LASIX) 20 MG tablet   nitroGLYCERIN (NITROSTAT) 0.4 MG SL tablet   Hypertensive cardiovascular disease (Chronic)    Grade 2 diastolic dysfunction seen on perioperative echo in the setting of coronary disease.  No active true diastolic heart failure symptoms, however she does have some exertional dyspnea and edema as well as occasionally having some orthopnea. Plan: We will start low-dose furosemide that she will take 3 days a week and then additionally as needed.  See instructions below.      Relevant Medications   furosemide (LASIX) 20 MG tablet   nitroGLYCERIN (NITROSTAT) 0.4 MG SL tablet   Other Relevant Orders   EKG 12-Lead (Completed)   Insulin dependent diabetes mellitus ([place]) (Chronic)    Not adequately controlled by last check.  She is now on Lantus along with metformin.  Follow closely, may benefit from [place].      NSTEMI (non-ST elevated myocardial infarction) ([facility]) (Chronic)    Thankfully, her EF was relatively well preserved despite having multivessel disease.  No further anginal symptoms since CABG complete  revascularization.      Relevant Medications   furosemide (LASIX) 20 MG tablet   nitroGLYCERIN (NITROSTAT) 0.4 MG SL tablet   Other Relevant Orders   EKG 12-Lead (Completed)   S/P CABG x 4 (Chronic)    She does have some this classic postop discomfort with the numbness along her chest wall sternal wire type pains.  I believe she has been released to full activity from CT surgery.      Relevant Orders   EKG 12-Lead (Completed)      Current medicines are reviewed at length with the patient [name] [name]. (+/- concerns) n/a The following changes have been made: see below  Patient Instructions  MEDICATION INSTRUCTIONS  TAKE FUROSEMIDE 20 MG  THREE TIMES A WEEK , IF YOU WAKE UP SHORT OF BREATHE OR HAVE DIFFICULTY THROUGH THE NIGHT TAKE 20 MG FUROSEMIDE THE NEXT MORNING , IF IT IS TH DAY TO TAKE YOU  DOSE OF FUROSEMIDE THAT WEEK T TAKE   YOUR REGULAR DOSE PLUS AN ADDITIONAL TABLET.     MAY USE NITROGLYCERIN SUBLINGUAL TABLETS IF NEED FOR CHEST DISCOMFORT. ONE TABLET UNDER YOU TONGUE EVERY FIVE MINUTE UP TO 3 TIMES WITH EACH EPISODES.     Your physician recommends that you schedule a follow-up appointment in 3 TO [place].   If you need a refill on your cardiac medications before your next appointment, please call your pharmacy.      Nitroglycerin sublingual tablets What is this medicine? NITROGLYCERIN (nye troe GLI ser in) is a type of vasodilator. It relaxes blood vessels, increasing the blood and oxygen supply to your heart. This medicine is used to relieve chest pain caused by angina. It is also used to prevent chest pain before activities like climbing stairs, going outdoors in cold weather, or sexual activity. This medicine may be used for other purposes; ask  your health care provider or pharmacist if you have questions. COMMON BRAND NAME(S): Nitroquick, Nitrostat, Nitrotab What should I tell my health care provider before I take this medicine? They need to  know if you have any of these conditions: -anemia -head injury, recent stroke, or bleeding in the brain -liver disease -previous heart attack -an unusual or allergic reaction to nitroglycerin, other medicines, foods, dyes, or preservatives -pregnant or trying to get pregnant -breast-feeding [name] [name] should I use this medicine? Take this medicine by mouth as needed. At the first sign of an angina attack (chest pain or tightness) place one tablet under your tongue. You can also take this medicine 5 to 10 minutes before an event likely to produce chest pain. Follow the directions on the prescription label. Let the tablet dissolve under the tongue. Do not swallow whole. Replace the dose if you accidentally swallow it. It will help if your mouth is not dry. Saliva around the tablet will help it to dissolve more quickly. Do not eat or drink, smoke or chew tobacco while a tablet is dissolving. If you are not better within 5 minutes after taking ONE dose of nitroglycerin, call 9-1-1 immediately to seek emergency medical care. Do not take more than 3 nitroglycerin tablets over 15 minutes. If you take this medicine often to relieve symptoms of angina, your doctor or health care professional may provide you with different instructions to manage your symptoms. If symptoms do not go away after following these instructions, it is important to call 9-1-1 immediately. Do not take more than 3 nitroglycerin tablets over 15 minutes. Talk to your pediatrician regarding the use of this medicine in children. Special care may be needed. Overdosage: If you think you have taken too much of this medicine contact a poison control center or emergency room at once. NOTE: This medicine is only for you. Do not share this medicine with others. What if I miss a dose? This does not apply. This medicine is only used as needed. What may interact with this medicine? Do not take this medicine with any of the following medications: -certain  migraine medicines like ergotamine and dihydroergotamine (DHE) -medicines used to treat erectile dysfunction like sildenafil, tadalafil, and vardenafil -riociguat This medicine may also interact with the following medications: -alteplase -aspirin -heparin -medicines for high blood pressure -medicines for mental depression -other medicines used to treat angina -phenothiazines like chlorpromazine, mesoridazine, prochlorperazine, thioridazine This list may not describe all possible interactions. Give your health care provider a list of all the medicines, herbs, non-prescription drugs, or dietary supplements you use. Also tell them if you smoke, drink alcohol, or use illegal drugs. Some items may interact with your medicine. What should I watch for while using this medicine? Tell your doctor or health care professional if you feel your medicine is no longer working. Keep this medicine with you at all times. Sit or lie down when you take your medicine to prevent falling if you feel dizzy or faint after using it. Try to remain calm. This will help you to feel better faster. If you feel dizzy, take several deep breaths and lie down with your feet propped up, or bend forward with your head resting between your knees. You may get drowsy or dizzy. Do not drive, use machinery, or do anything that needs mental alertness until you know [name] [name] this drug affects you. Do not stand or sit up quickly, especially if you are an older patient. This reduces the risk of dizzy or fainting  spells. Alcohol can make you more drowsy and dizzy. Avoid alcoholic drinks. Do not treat yourself for coughs, colds, or pain while you are taking this medicine without asking your doctor or health care professional for advice. Some ingredients may increase your blood pressure. What side effects may I notice from receiving this medicine? Side effects that you should report to your doctor or health care professional as soon as  possible: -blurred vision -dry mouth -skin rash -sweating -the feeling of extreme pressure in the head -unusually weak or tired Side effects that usually do not require medical attention (report to your doctor or health care professional if they continue or are bothersome): -flushing of the face or neck -headache -irregular heartbeat, palpitations -nausea, vomiting This list may not describe all possible side effects. Call your doctor for medical advice about side effects. You may report side effects to FDA at 1-800-FDA-1088. Where should I keep my medicine? Keep out of the reach of children. Store at room temperature between 20 and 25 degrees C (68 and 77 degrees F). Store in [employer]. Protect from light and moisture. Keep tightly closed. Throw away any unused medicine after the expiration date. NOTE: This sheet is a summary. It may not cover all possible information. If you have questions about this medicine, talk to your doctor, pharmacist, or health care provider.  2018 Elsevier/Gold Standard (2012-11-16 17:57:36)        Studies Ordered:   Orders Placed This Encounter  Procedures  . EKG 12-Lead      Glenetta Hew, M.D., M.S. Interventional Cardiologist   Pager # [phone] Phone # [phone] [address] [place], [place] [address]   Thank you for choosing [facility] at [facility]!!

## 2017-02-25 ENCOUNTER — Encounter: Payer: Self-pay | Admitting: Cardiology

## 2017-02-25 NOTE — Assessment & Plan Note (Signed)
Grade 2 diastolic dysfunction seen on perioperative echo in the setting of coronary disease.  No active true diastolic heart failure symptoms, however she does have some exertional dyspnea and edema as well as occasionally having some orthopnea. Plan: We will start low-dose furosemide that she will take 3 days a week and then additionally as needed.  See instructions below.

## 2017-02-25 NOTE — Assessment & Plan Note (Signed)
Not adequately controlled by last check.  She is now on Lantus along with metformin.  Follow closely, may benefit from [place].

## 2017-02-25 NOTE — Assessment & Plan Note (Signed)
Thankfully, her EF was relatively well preserved despite having multivessel disease.  No further anginal symptoms since CABG complete revascularization.

## 2017-02-25 NOTE — Assessment & Plan Note (Addendum)
Doing relatively well status post CABG.  Finally now starting to go to cardiac rehab.  I encouraged her to continue to keep this up. No active anginal symptoms.  She is not on much in the way of medications:   Continue aspirin and Plavix (due to recent non-STEMI),   Continue beta-blocker and high-dose statin along  If she continues to have her strange sounding chest discomfort over the course of this next several months before I see her back, we will have a low threshold for checking a new baseline stress test just to exclude true ischemic pain.

## 2017-02-25 NOTE — Assessment & Plan Note (Signed)
She does have some this classic postop discomfort with the numbness along her chest wall sternal wire type pains.  I believe she has been released to full activity from CT surgery.

## 2017-02-25 NOTE — Assessment & Plan Note (Signed)
LDL as of October was pretty well controlled.  I think this is being managed by her diabetes physician. She is on a decent dose of statin, and borderline goal.  Continue to follow.

## 2017-05-24 ENCOUNTER — Encounter: Payer: Self-pay | Admitting: Cardiology

## 2017-05-24 ENCOUNTER — Ambulatory Visit (INDEPENDENT_AMBULATORY_CARE_PROVIDER_SITE_OTHER): Payer: PRIVATE HEALTH INSURANCE | Admitting: Cardiology

## 2017-05-24 VITALS — BP 113/71 | HR 87 | Ht 61.5 in | Wt 207.6 lb

## 2017-05-24 DIAGNOSIS — I110 Hypertensive heart disease with heart failure: Secondary | ICD-10-CM | POA: Diagnosis not present

## 2017-05-24 DIAGNOSIS — I214 Non-ST elevation (NSTEMI) myocardial infarction: Secondary | ICD-10-CM

## 2017-05-24 DIAGNOSIS — E785 Hyperlipidemia, unspecified: Secondary | ICD-10-CM

## 2017-05-24 DIAGNOSIS — I2510 Atherosclerotic heart disease of native coronary artery without angina pectoris: Secondary | ICD-10-CM

## 2017-05-24 DIAGNOSIS — I10 Essential (primary) hypertension: Secondary | ICD-10-CM | POA: Diagnosis not present

## 2017-05-24 DIAGNOSIS — I5032 Chronic diastolic (congestive) heart failure: Secondary | ICD-10-CM

## 2017-05-24 MED ORDER — FREESTYLE LIBRE 14 DAY SENSOR MISC: 2.0000 | 11 refills | Status: DC

## 2017-05-24 MED ORDER — ALUMINUM-MAGNESIUM-SIMETHICONE 200-200-20 MG/5ML PO SUSP
30.00 | ORAL | Status: DC
Start: ? — End: 2017-05-24

## 2017-05-24 NOTE — Patient Instructions (Addendum)
Medication instruction Stop aspirin   Continue take clopidogrel 75 mg     please have V.A. to schedule a lipid panel   Your physician recommends that you schedule a follow-up appointment in Sept 19 2019 AT 8:20 AM with Dr Ellyn Hack   If you need a refill on your cardiac medications before your next appointment, please call your pharmacy.

## 2017-05-24 NOTE — Progress Notes (Signed)
PCP: Shepherd  --> She is an employee of [employer], and was hoping to be followed up by cardiologist at [facility] Note: Chief Complaint  Patient presents with  . Follow-up    Doing well  . Coronary Artery Disease    Non-STEMI-multivessel CAD-post CABG    HPI: [name] [name] is a 47 y.o. female with a PMH below who presents today for 43-monthfollow-up for CAD-CABG. She was admitted in October 2018 with chest pain concerning for an anginal presentation.  She ruled in for non-STEMI and underwent cardiac catheterization revealing totally occluded distal RCA with diffusely diseased RCA upstream and severe proximal LAD disease as well as proximal circumflex disease.  After much deliberation, we determined that she was probably best suited for CABG and underwent four-vessel CABG on October 26 with Dr. ORoxy Manns.    December 15, 2016 by HAlmyra Deforest PA for her postop/post hospital follow-up.  She was doing relatively well except having some cough and orthopnea symptoms.  She was urged to restart her daily Lasix.  She was then last seen by Dr. ORoxy Manns--she noted slow steady progress but had not yet to start cardiac rehab.  [name] FERRINwas seen on February 23, 2017 --she was doing well at cardiac week Gab is noticing some muscular skeletal chest pain number incisions.  No further anginal symptoms.  Was finally starting to feel little bit happier.  Recent Hospitalizations: ER for epigastric pain  Studies Personally Reviewed - (if available, images/films reviewed: From Epic Chart or Care Everywhere)  No new  Interval History: [name] Bornereturns today with a smile on her face.  She is happy.  Finally starting to feel more like herself.  She did about a month and a half a cardiac rehab before she had to stop in order to go back to work.  She still notes a little bit of exertional dyspnea and some exercise intolerance, but is notably better than she was prior to  her surgery.  She is no longer feeling any palpitations.  Musculoskeletal chest discomfort symptoms have notably improved. She was having lots of diarrhea and suddenly stopped her metoprolol.  Since that her diarrhea is notably improved and she is thankful but it was not a UC flare.  She is still try to stay active monitoring herself like she was doing a cardiac rehab and has not had any symptoms of chest tightness or pressure with rest or exertion similar to her angina with MI.  She denies any PND, orthopnea or edema besides some mild end of day swelling.  But even that is better. Palpitations of all but gone away.  No lightheadedness, dizziness or syncope/near syncope.  No TIA or amaurosis fugax.  No claudication. She remains on aspirin Plavix because of her presentation as an MI and denies any bleeding issues or easy bruising.  No melena, hematochezia, hematemesis,hematuria or epistaxis.    ROS: A comprehensive was performed. Review of Systems  Constitutional: Negative for malaise/fatigue.  HENT: Negative for congestion and nosebleeds.   Respiratory: Negative for cough, shortness of breath and wheezing.   Gastrointestinal: Positive for diarrhea (Better since stopping the metformin). Negative for constipation.       No recent UC flares  Genitourinary: Negative for dysuria.  Musculoskeletal: Negative for joint pain and myalgias.  Neurological: Negative for dizziness and focal weakness.  Psychiatric/Behavioral: Negative for depression and memory loss. The patient is nervous/anxious. The patient does not have insomnia.  All other systems reviewed and are negative.   I have reviewed and (if needed) personally updated the patient's problem list, medications, allergies, past medical and surgical history, social and family history.   Past Medical History:  Diagnosis Date  . Allergic rhinitis   . Anemia   . Arthritis    "mostly in my hands" (11/23/2016)  . Borderline high cholesterol   .  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  Severe 75-80% proximal LAD and proximal Circumflex disease. --Referred for CABG  . Endometriosis   . Fibromyalgia   . GERD (gastroesophageal reflux disease)   . Grave's disease    S/P irradiation  . Hypothyroid    S/P radiation  . Left sided ulcerative colitis ([place])   . Migraine headache    "~ 4/month" (11/23/2016)  . NSTEMI (non-ST elevated myocardial infarction) ([place]) 11/22/2016  . 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 Sleep apnea    "couldn't tolerate the mask; only had a slight case of it" (11/23/2016)  . Type II diabetes mellitus ([place])   . Vasculitis [facility])     Past Surgical History:  Procedure Laterality Date  . ABDOMINAL HYSTERECTOMY  07/2006   "Davinci; partial"  . CESAREAN SECTION  1994  . COLONOSCOPY W/ BIOPSIES AND POLYPECTOMY  03/2008- 2017 X "4 at least"  . CORONARY ARTERY BYPASS GRAFT N/A 11/26/2016   Procedure: CORONARY ARTERY BYPASS GRAFTING (CABG), ON PUMP, TIMES FOUR, USING LEFT INTERNAL MAMMARY ARTERY AND ENDOSCOPICALLY HARVESTED RIGHT GREATER SAPHENOUS VEIN;  Surgeon: Rexene Alberts, MD;  Location: [place];  Service: Open Heart Surgery;  Laterality: N/A;  LIMA-LAD, SVG-OM, SVG-PL, SVG-DIAG  . DILATION AND CURETTAGE OF UTERUS  1996  . LEFT HEART CATH AND CORONARY ANGIOGRAPHY N/A 11/23/2016   Procedure: LEFT HEART CATH AND CORONARY ANGIOGRAPHY;  Surgeon: Sherren Mocha, MD;  Location: [place] [facility];  Service: Cardiovascular:  Diffuse proximal RCA with 100% occluded distal RCA.  Severe 75-80% proximal LAD and proximal Circumflex disease. --Referred for CABG  . TEE WITHOUT CARDIOVERSION N/A 11/26/2016   Procedure: TRANSESOPHAGEAL ECHOCARDIOGRAM (TEE);  Surgeon: Rexene Alberts, MD;  Location: [place];  Service: Open Heart Surgery;  Laterality: N/A;     Cardiac Cath November 23, 2016: Diffuse  RCA disease with total occlusion of the distal RCA (likely culprit lesion for non-STEMI).  Severe proximal LAD disease with tandem 70% lesions along with 70% D1.  Also severe diffuse 80-50% proximal circumflex.  Mild LV dysfunction with akinesis in the mid inferior wall through the anterolateral and periapical walls.  Intra-Op TEE November 26, 2016: EF 50-55% with GRII DD.  Mild mitral valve leaflet thickening.  Trace TR.  Current Meds  Medication Sig  . aspirin EC 81 MG EC tablet Take 1 tablet (81 mg total) by mouth daily.  Marland Kitchen atorvastatin (LIPITOR) 80 MG tablet Take 1 tablet (80 mg total) by mouth daily at 6 PM.  . clopidogrel (PLAVIX) 75 MG tablet Take 1 tablet (75 mg total) by mouth daily.  . insulin glargine (LANTUS) 100 UNIT/ML injection Inject 0.25 mLs (25 Units total) into the skin at bedtime.  Marland Kitchen levothyroxine (SYNTHROID) 100 MCG tablet Take 1 tablet (100 mcg total) by mouth daily before breakfast.  . metoprolol tartrate (LOPRESSOR) 25 MG tablet Take 0.5 tablets (12.5 mg total) 2 (two) times  daily by mouth.  . nitroGLYCERIN (NITROSTAT) 0.4 MG SL tablet Place 1 tablet (0.4 mg total) under the tongue every 5 (five) minutes as needed for chest pain.  Marland Kitchen omeprazole (PRILOSEC) 20 MG capsule Take 2 tablets by mouth once daily  . ondansetron (ZOFRAN) 4 MG tablet Take 4 mg by mouth every 8 (eight) hours as needed for nausea or vomiting.  . pantoprazole (PROTONIX) 40 MG tablet Take 1 tablet by mouth 2 (two) times daily.  . SUMAtriptan (IMITREX) 100 MG tablet Take 50-100 mg by mouth 2 (two) times daily as needed for migraine. May repeat in 2 hours if headache persists or recurs.    Allergies  Allergen Reactions  . Shellfish Allergy Hives and Swelling    And hives    Social History   Tobacco Use  . Smoking status: Never Smoker  . Smokeless tobacco: Never Used  Substance Use Topics  . Alcohol use: No  . Drug use: No   Social History   Social History Narrative  . Not on file    family  history includes Cervical cancer in her paternal grandmother; Diabetes in her maternal grandmother; Heart disease in her maternal grandfather and maternal grandmother; Kidney disease in her paternal grandmother.  Wt Readings from Last 3 Encounters:  05/24/17 207 lb 9.6 oz (94.2 kg)  02/23/17 200 lb (90.7 kg)  02/18/17 201 lb 12.8 oz (91.5 kg)    PHYSICAL EXAM BP 113/71   Pulse 87   Ht 5' 1.5" (1.562 m)   Wt 207 lb 9.6 oz (94.2 kg)   SpO2 99%   BMI 38.59 kg/m  Physical Exam  Constitutional: She is oriented to person, place, and time. She appears well-developed and well-nourished. No distress.  Moderate to severely obese.  Well-groomed  HENT:  Head: Normocephalic and atraumatic.  Eyes: EOM are normal. No scleral icterus.  Neck: No hepatojugular reflux and no JVD present. Carotid bruit is not present.  Cardiovascular: Normal rate, regular rhythm, normal heart sounds and intact distal pulses.  No extrasystoles are present. PMI is not displaced. Exam reveals no gallop and no friction rub.  No murmur heard. Pulmonary/Chest: Effort normal and breath sounds normal. No respiratory distress. She has no wheezes. She has no rales.  Tenderness to palpation along sternal border (sternal scar)  Abdominal: Soft. Bowel sounds are normal. She exhibits no distension. There is no tenderness. There is no rebound.  Musculoskeletal: Normal range of motion. She exhibits no edema (trace).  Neurological: She is alert and oriented to person, place, and time.  Skin: Skin is warm.  Psychiatric: She has a normal mood and affect. Her behavior is normal. Judgment and thought content normal.  Happy mood  Vitals reviewed.   Adult ECG Report  Other studies Reviewed: Additional studies/ records that were reviewed today include:  Recent Labs: Lipids and diabetes been managed by the [place]. --She has the arm glucometer sensor (I will provide prescriptions for her supplies. Lab Results  Component Value Date    CREATININE 0.90 12/27/2016   BUN 8 12/27/2016   NA 140 12/27/2016   K 4.1 12/27/2016   CL 103 12/27/2016   CO2 22 12/27/2016   Lab Results  Component Value Date   CHOL 132 11/23/2016   HDL 38 (L) 11/23/2016   LDLCALC 73 11/23/2016   TRIG 103 11/23/2016   CHOLHDL 3.5 11/23/2016   Lab Results  Component Value Date   HGBA1C 9.4 (H) 11/24/2016    ASSESSMENT / PLAN: Problem List Items  Addressed This Visit    NSTEMI (non-ST elevated myocardial infarction) (HCC) (Chronic)    Preserved EF on Intra-Op TEE.  No angina or heart failure symptoms since her revascularization.      Morbid obesity (HCC) BMI 38 with hyperlipidemia, diabetes and CAD (Chronic)    She gained back some of the weight that she had lost during her postop time from the surgery.  Now she needs to work on truly losing weight from a diet and exercise standpoint.  With her improved glycemic control efforts, I hope the lipids will also follow suit.  As such, this would hopefully lead to her losing weight.  She needs doing increase her level of exercise and keep monitoring her diet.      Hypertensive cardiovascular disease (Chronic)    Grade 2 diastolic function noted on echo.  Blood pressures well controlled currently she seems euvolemic.  She will be started on low-dose diuretic but stopped it somewhere since I last seen her.  The plan was for her to have that there is an as needed medication. Currently seems euvolemic.  I actually think the diastolic dysfunction was probably related to ischemia in the setting of the MI.  At this point would probably not have significant diastolic dysfunction if rechecked.      Essential hypertension (Chronic)    Blood pressure is well controlled on low-dose metoprolol.  Not able to tolerate any further medications.  Currently her blood pressure will allow [place] to use an ACE inhibitor or ARB.  If her pressures increase, that would be the next step.      Dyslipidemia, goal LDL below 70  (Chronic)    Labs are being followed by the [facility] along with diabetes.  I have asked that the [place] recheck a set of labs.  Her most recent ones at the time of her MI look pretty good.  I would like to see if we can potentially come down to 40 mg of atorvastatin.      CAD, multiple vessel - Primary (Chronic)    Doing well post CABG.  The plan was to continue aspirin and Plavix for 1 year and then stop aspirin. Still on high-dose statin, but if lipids look better on follow-up this summer, could probably go to 40 mg. No further anginal symptoms.  Only tolerating low-dose metoprolol with well-controlled blood pressure and euvolemia.         Current medicines are reviewed at length with the patient today. (+/- concerns) n/a The following changes have been made: see below  Patient Instructions  Medication instruction Stop aspirin   Continue take clopidogrel 75 mg     please have V.A. to schedule a lipid panel   Your physician recommends that you schedule a follow-up appointment in Sept 19 2019 AT 8:20 AM with Dr Ellyn Hack   If you need a refill on your cardiac medications before your next appointment, please call your pharmacy.    Studies Ordered:   No orders of the defined types were placed in this encounter.     Glenetta Hew, M.D., M.S. Interventional Cardiologist   Pager # [phone] Phone # [phone] [address] [place], [place] [address]   Thank you for choosing Heartcare at [facility]!!

## 2017-05-26 ENCOUNTER — Encounter: Payer: Self-pay | Admitting: Cardiology

## 2017-05-26 NOTE — Assessment & Plan Note (Signed)
She gained back some of the weight that she had lost during her postop time from the surgery.  Now she needs to work on truly losing weight from a diet and exercise standpoint.  With her improved glycemic control efforts, I hope the lipids will also follow suit.  As such, this would hopefully lead to her losing weight.  She needs doing increase her level of exercise and keep monitoring her diet.

## 2017-05-26 NOTE — Assessment & Plan Note (Signed)
Preserved EF on Intra-Op TEE.  No angina or heart failure symptoms since her revascularization.

## 2017-05-26 NOTE — Assessment & Plan Note (Addendum)
Grade 2 diastolic function noted on echo.  Blood pressures well controlled currently she seems euvolemic.  She will be started on low-dose diuretic but stopped it somewhere since I last seen her.  The plan was for her to have that there is an as needed medication. Currently seems euvolemic.  I actually think the diastolic dysfunction was probably related to ischemia in the setting of the MI.  At this point would probably not have significant diastolic dysfunction if rechecked.

## 2017-05-26 NOTE — Assessment & Plan Note (Addendum)
Blood pressure is well controlled on low-dose metoprolol.  Not able to tolerate any further medications.  Currently her blood pressure will allow [place] to use an ACE inhibitor or ARB.  If her pressures increase, that would be the next step.

## 2017-05-26 NOTE — Assessment & Plan Note (Signed)
Labs are being followed by the [place] along with diabetes.  I have asked that the [place] recheck a set of labs.  Her most recent ones at the time of her MI look pretty good.  I would like to see if we can potentially come down to 40 mg of atorvastatin.

## 2017-05-26 NOTE — Assessment & Plan Note (Signed)
Doing well post CABG.  The plan was to continue aspirin and Plavix for 1 year and then stop aspirin. Still on high-dose statin, but if lipids look better on follow-up this summer, could probably go to 40 mg. No further anginal symptoms.  Only tolerating low-dose metoprolol with well-controlled blood pressure and euvolemia.

## 2017-10-17 ENCOUNTER — Telehealth: Payer: Self-pay | Admitting: Cardiology

## 2017-10-17 NOTE — Telephone Encounter (Signed)
New Message:       *STAT* If patient is at the pharmacy, call can be transferred to refill team.   1. Which medications need to be refilled? (please list name of each medication and dose if known) Continuous Blood Gluc Sensor (FREESTYLE LIBRE 14 DAY SENSOR) MISC  2. Which pharmacy/location (including street and city if local pharmacy) is medication to be sent to? [facility] - [place], [place]  3. Do they need a 30 day or 90 day supply? 90 Days

## 2017-10-20 ENCOUNTER — Ambulatory Visit: Payer: PRIVATE HEALTH INSURANCE | Admitting: Cardiology

## 2017-10-21 MED ORDER — FREESTYLE LIBRE 14 DAY SENSOR MISC: 2.0000 | 2 refills | Status: DC

## 2017-10-21 NOTE — Telephone Encounter (Signed)
I refilled this once for her - but would prefer for her PCP or endocrinologist to refill in the future.  Glenetta Hew, MD

## 2017-10-21 NOTE — Telephone Encounter (Signed)
Spoke to patient. She aware will refill x 1. She states the [facility] NOT [facility]NA[facility] AND WILL NOT BE ABLE TO SEND PRESCRIPTION  TO MAIL ORDER SINCE NOT [place] DEPT. PATIENT STATES SHE DOES NOT HAVE  PRIMARY OR ENDOCRINOLOGIST TO REFILL OR MANAGE DIABETES. [facility].  SHE WILL INTO FINDING A PHYSICIAN.

## 2017-11-21 ENCOUNTER — Ambulatory Visit: Payer: PRIVATE HEALTH INSURANCE | Admitting: Thoracic Surgery (Cardiothoracic Vascular Surgery)

## 2017-11-21 ENCOUNTER — Other Ambulatory Visit: Payer: Self-pay

## 2017-11-21 ENCOUNTER — Encounter: Payer: Self-pay | Admitting: Thoracic Surgery (Cardiothoracic Vascular Surgery)

## 2017-11-21 VITALS — BP 114/72 | HR 70 | Resp 16 | Ht 61.5 in | Wt 211.0 lb

## 2017-11-21 DIAGNOSIS — Z951 Presence of aortocoronary bypass graft: Secondary | ICD-10-CM

## 2017-11-21 DIAGNOSIS — I25110 Atherosclerotic heart disease of native coronary artery with unstable angina pectoris: Secondary | ICD-10-CM

## 2017-11-21 NOTE — Progress Notes (Signed)
[address]       [place] [address]             [phone]     CARDIOTHORACIC SURGERY OFFICE NOTE  Referring Provider is Leonie Man, MD [facility]   HPI:  Patient has 47 year old obese African-American female with coronary artery disease, insulin-dependent type 2 diabetes mellitus, hyperlipidemia, and chronic immunosuppression who returns the office today for routine follow-up approximately 1 year status post coronary artery bypass grafting times 4 on November 26, 2016 for severe multivessel coronary artery disease status post acute non-ST segment elevation myocardial infarction.  Her postoperative recovery was uncomplicated and she was last seen here in our office on February 18, 2017.  Since then she has enrolled and participated in a cardiac rehab program, although she states that she had not completed.  She was seen in follow-up by Dr. Ellyn Hack last January and is scheduled for routine follow-up with him later this month.  She returns to our office today and reports that overall she is doing well.  She states that she feels "much improved" in comparison with how she felt prior to surgery.  She states that she does still get short of breath with physical exertion but this only occurs with more strenuous exertion and only limits her slightly in terms of her day-to-day activities.  She states that she occasionally feels some mild tightness across her chest but this occurs only with vigorous exertion she otherwise experiences no symptoms of chest pain or chest tightness.  She states that recently she has had occasional brief episodes of paroxysmal tachycardia where she feels her heart pounding or racing.  This is usually transient and short lived.  It is not associated with chest pain but does cause some mild shortness of breath.  She admits that she does not exercise on a regular basis.  She tries to walk regularly, but she does so only perhaps 2 or  3 times a week at most.  She has not lost any weight.  Her diabetes remains under marginal control and she states that her last hemoglobin A1c was somewhere around 9.   Current Outpatient Medications  Medication Sig Dispense Refill  . atorvastatin (LIPITOR) 80 MG tablet Take 1 tablet (80 mg total) by mouth daily at 6 PM. 30 tablet 1  . clopidogrel (PLAVIX) 75 MG tablet Take 1 tablet (75 mg total) by mouth daily. 30 tablet 1  . Continuous Blood Gluc Sensor (FREESTYLE LIBRE 14 DAY SENSOR) MISC 2 Doses/Fill by Does not apply route every 14 (fourteen) days. APPLY 1 SENSOR EVERY 14 DAYS AS DIRECTED 6 each 2  . GLIMEPIRIDE PO Take 0.5 mg by mouth daily before breakfast. AND AC LUNCH    . insulin glargine (LANTUS) 100 UNIT/ML injection Inject 0.25 mLs (25 Units total) into the skin at bedtime. (Patient taking differently: Inject 37 Units into the skin at bedtime. ) 10 mL 11  . levothyroxine (SYNTHROID) 100 MCG tablet Take 1 tablet (100 mcg total) by mouth daily before breakfast. 30 tablet 1  . metoprolol succinate (TOPROL-XL) 25 MG 24 hr tablet Take 25 mg by mouth daily.    . nitroGLYCERIN (NITROSTAT) 0.4 MG SL tablet Place 1 tablet (0.4 mg total) under the tongue every 5 (five) minutes as needed for chest pain. 25 tablet 6  . omeprazole (PRILOSEC) 20 MG capsule Take 2 tablets by mouth once daily    . pantoprazole (PROTONIX) 40  MG tablet Take 1 tablet by mouth 2 (two) times daily.  0  . SUMAtriptan (IMITREX) 100 MG tablet Take 50-100 mg by mouth 2 (two) times daily as needed for migraine. May repeat in 2 hours if headache persists or recurs.     No current facility-administered medications for this visit.       Physical Exam:   BP 114/72 (BP Location: Left Arm, Patient Position: Sitting, Cuff Size: Large)   Pulse 70   Resp 16   Ht 5' 1.5" (1.562 m)   Wt 211 lb (95.7 kg)   SpO2 98% Comment: ON RA  BMI 39.22 kg/m   General:  Obese but well-appearing  Chest:   Clear to  auscultation  CV:   Regular rate and rhythm  Incisions:  Completely healed, sternum is stable  Abdomen:  Soft nontender  Extremities:  Warm and well-perfused, no edema  Diagnostic Tests:  2 channel telemetry rhythm demonstrates normal sinus rhythm   Impression:  Patient seems to be doing reasonably well approximately 1 year status post coronary artery bypass grafting.  She reports stable symptoms of mild exertional shortness of breath that occur only with more strenuous physical exertion.  She has recently had some mild transient palpitations.  Plan:  We have not recommended any change the patient's current medications.  However, it might be reasonable to stop taking Plavix.  I have instructed the patient to discuss this with Dr. Ellyn Hack at her upcoming follow-up appointment.  I have also suggested that she should discuss her symptoms of palpitations with Dr. Ellyn Hack.  She has been reminded regarding the many benefits of regular exercise, weight loss, and strict attention to management of her diabetes.  All of her questions have been addressed.  In the future she will call and return to see [place] only should specific problems or questions arise.  I spent in excess of 15 minutes during the conduct of this office consultation and >50% of this time involved direct face-to-face encounter with the patient for counseling and/or coordination of their care.    Valentina Gu. Roxy Manns, MD 11/21/2017 10:50 AM

## 2017-11-21 NOTE — Patient Instructions (Addendum)
Continue all previous medications without any changes at this time.  It would be reasonable to stop taking Plavix at this time, but you should discuss with your cardiologist before you do.  Make every effort to stay physically active, get some type of exercise on a regular basis, and stick to a "heart healthy diet".  The long term benefits for regular exercise and a healthy diet are critically important to your overall health and wellbeing.  You have been advised of the numerous benefits associated with regular exercise and weight loss.  The long term benefits for your overall health and wellbeing cannot be overestimated.  Make every effort to keep your diabetes under very tight control.  Follow up closely with your primary care physician or endocrinologist and strive to keep their hemoglobin A1c levels as low as possible, preferably near or below 6.0.  The long term benefits of strict control of diabetes are far reaching and critically important for your overall health and survival.

## 2017-12-01 ENCOUNTER — Encounter: Payer: Self-pay | Admitting: Cardiology

## 2017-12-01 ENCOUNTER — Ambulatory Visit: Payer: PRIVATE HEALTH INSURANCE | Admitting: Cardiology

## 2017-12-01 VITALS — BP 115/70 | HR 65 | Ht 61.0 in | Wt 213.6 lb

## 2017-12-01 DIAGNOSIS — R0602 Shortness of breath: Secondary | ICD-10-CM | POA: Insufficient documentation

## 2017-12-01 DIAGNOSIS — Z794 Long term (current) use of insulin: Secondary | ICD-10-CM

## 2017-12-01 DIAGNOSIS — IMO0001 Reserved for inherently not codable concepts without codable children: Secondary | ICD-10-CM

## 2017-12-01 DIAGNOSIS — E785 Hyperlipidemia, unspecified: Secondary | ICD-10-CM

## 2017-12-01 DIAGNOSIS — I5032 Chronic diastolic (congestive) heart failure: Secondary | ICD-10-CM

## 2017-12-01 DIAGNOSIS — I110 Hypertensive heart disease with heart failure: Secondary | ICD-10-CM

## 2017-12-01 DIAGNOSIS — R002 Palpitations: Secondary | ICD-10-CM | POA: Diagnosis not present

## 2017-12-01 DIAGNOSIS — I10 Essential (primary) hypertension: Secondary | ICD-10-CM | POA: Diagnosis not present

## 2017-12-01 DIAGNOSIS — E119 Type 2 diabetes mellitus without complications: Secondary | ICD-10-CM

## 2017-12-01 DIAGNOSIS — I2510 Atherosclerotic heart disease of native coronary artery without angina pectoris: Secondary | ICD-10-CM

## 2017-12-01 MED ORDER — NITROGLYCERIN 0.4 MG SL SUBL: 0.4000 mg | SUBLINGUAL_TABLET | SUBLINGUAL | 6 refills | Status: AC | PRN

## 2017-12-01 NOTE — Assessment & Plan Note (Signed)
Diabetes medications have been adjusted by her [place] doctor.  Unfortunately they do not prescribe the percutaneous sensor.  I am therefore providing refills for this until she does establish a civilian PCP. Again consider the possibility of using [employer] for cardiovascular and diabetic benefit.

## 2017-12-01 NOTE — Assessment & Plan Note (Signed)
Not sure how much this is related to deconditioning and obesity versus hypertensive heart disease.  Checking 2D echocardiogram just to assess the extent of diastolic dysfunction.

## 2017-12-01 NOTE — Progress Notes (Signed)
PCP: Moore  --> She is an employee of [employer], and was hoping to be followed up by cardiologist at [facility] Note: Chief Complaint  Patient presents with  . Follow-up  . Palpitations    Past irregular heartbeat episodes  . Coronary Artery Disease    No angina    HPI: [name] [name] is a 47 y.o. female with a PMH of CAD-CABG who presents today for 50-monthfollow-up.   Non-STEMI November 22, 2016 --> occluded distal RCA with multivessel disease referred for CABGx4  (LIMA-LAD, SVG-D1, SVG-OM, SVG-RPL.) October 26 with Dr. ORoxy Manns.Marny Lowensteinwas seen on May 24, 2017 --she was doing well, happy.  Did ~1 month [facility].  A little DOE.   Recent Hospitalizations: none  Studies Personally Reviewed - (if available, images/films reviewed: From Epic Chart or Care Everywhere)  No new  Interval History: [name] Bornereturns today noting that over the last month or so she is been having intermittent episodes of fast irregular heartbeat spells lasting anywhere from 15 minutes to an hour.  She does not think that there fast enough to make her feel lightheaded or dizzy.  But she does feel short of breath when they occur.  She has not had any chest pain with them however.  These episodes usually happen when she is relaxing at the end of the day or early in the morning, but can happen anytime during the day.  She does not think that her heart rate goes super fast, but goes fast not do that she feels like she is "running a race but not running ".  Otherwise she may have a little bit of edema but nothing significant.  She does not have any PND orthopnea.  No chest pain or pressure with rest or exertion.  No real exertional dyspnea.   No syncope/near syncope or TIA/amaurosis fugax.  Thankfully she has not had any recent UC flares either.  No melena, hematochezia, hematemesis,hematuria or epistaxis.    ROS: A comprehensive was performed. Review of  Systems  Constitutional: Negative for malaise/fatigue.  HENT: Negative for congestion and nosebleeds.   Respiratory: Negative for cough, shortness of breath and wheezing.   Gastrointestinal: Negative for abdominal pain, blood in stool, constipation, diarrhea and heartburn.       No recent UC flares  Genitourinary: Negative for dysuria.  Musculoskeletal: Negative for joint pain and myalgias.  Neurological: Negative for dizziness and focal weakness.  Psychiatric/Behavioral: Negative for depression and memory loss. The patient is not nervous/anxious (Much less anxious.) and does not have insomnia.   All other systems reviewed and are negative.   I have reviewed and (if needed) personally updated the patient's problem list, medications, allergies, past medical and surgical history, social and family history.   Past Medical History:  Diagnosis Date  . Allergic rhinitis   . Anemia   . Arthritis    "mostly in my hands" (11/23/2016)  . Borderline high cholesterol   . 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  Severe 75-80% proximal LAD and proximal Circumflex disease. --Referred for CABG  . Endometriosis   . Fibromyalgia   . GERD (gastroesophageal reflux disease)   . Grave's disease    S/P irradiation  . Hypothyroid    S/P radiation  . Left sided ulcerative colitis ([place]   . Migraine headache    "~ 4/month" (11/23/2016)  . NSTEMI (non-ST elevated  myocardial infarction) ([place]) 11/22/2016  . 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 Sleep apnea    "couldn't tolerate the mask; only had a slight case of it" (11/23/2016)  . Type II diabetes mellitus ([place])   . Vasculitis [facility])     Past Surgical History:  Procedure Laterality Date  . ABDOMINAL HYSTERECTOMY  07/2006   "Davinci; partial"  . CESAREAN SECTION  1994  . COLONOSCOPY W/ BIOPSIES AND POLYPECTOMY  03/2008-  2017 X "4 at least"  . CORONARY ARTERY BYPASS GRAFT N/A 11/26/2016   Procedure: CORONARY ARTERY BYPASS GRAFTING (CABG), ON PUMP, TIMES FOUR, USING LEFT INTERNAL MAMMARY ARTERY AND ENDOSCOPICALLY HARVESTED RIGHT GREATER SAPHENOUS VEIN;  Surgeon: Rexene Alberts, MD;  Location: [place];  Service: Open Heart Surgery;  Laterality: N/A;  LIMA-LAD, SVG-OM, SVG-PL, SVG-DIAG  . DILATION AND CURETTAGE OF UTERUS  1996  . LEFT HEART CATH AND CORONARY ANGIOGRAPHY N/A 11/23/2016   Procedure: LEFT HEART CATH AND CORONARY ANGIOGRAPHY;  Surgeon: Sherren Mocha, MD;  Location: [place];  Service: Cardiovascular:  Diffuse proximal RCA with 100% occluded distal RCA.  Severe 75-80% proximal LAD and proximal Circumflex disease.  Mild LV dysfunction with akinesis in the mid inferior wall through the anterolateral and periapical walls. --Referred for CABG  . TEE WITHOUT CARDIOVERSION N/A 11/26/2016   Procedure: TRANSESOPHAGEAL ECHOCARDIOGRAM (TEE);  Surgeon: Rexene Alberts, MD;  Location: [facility];  Service: Open Heart Surgery;;; EF 50-55% with GRII DD.  Mild mitral valve leaflet thickening.  Trace TR.    Current Meds  Medication Sig  . atorvastatin (LIPITOR) 80 MG tablet Take 1 tablet (80 mg total) by mouth daily at 6 PM.  . clopidogrel (PLAVIX) 75 MG tablet Take 1 tablet (75 mg total) by mouth daily.  . Continuous Blood Gluc Sensor (FREESTYLE LIBRE 14 DAY SENSOR) MISC 2 Doses/Fill by Does not apply route every 14 (fourteen) days. APPLY 1 SENSOR EVERY 14 DAYS AS DIRECTED  . GLIMEPIRIDE PO Take 0.5 mg by mouth 2 (two) times daily. Before Breakfast and LUNCH  . insulin glargine (LANTUS) 100 UNIT/ML injection Inject 0.25 mLs (25 Units total) into the skin at bedtime. (Patient taking differently: Inject 37 Units into the skin at bedtime. )  . levothyroxine (SYNTHROID) 100 MCG tablet Take 1 tablet (100 mcg total) by mouth daily before breakfast.  . metoprolol succinate (TOPROL-XL) 25 MG 24 hr tablet Take 25 mg by  mouth daily.  Marland Kitchen omeprazole (PRILOSEC) 20 MG capsule Take 2 tablets by mouth once daily  . SUMAtriptan (IMITREX) 100 MG tablet Take 50-100 mg by mouth 2 (two) times daily as needed for migraine. May repeat in 2 hours if headache persists or recurs.    Allergies  Allergen Reactions  . Shellfish Allergy Hives and Swelling    And hives    Social History   Tobacco Use  . Smoking status: Never Smoker  . Smokeless tobacco: Never Used  Substance Use Topics  . Alcohol use: No  . Drug use: No   Social History   Social History Narrative  . Not on file    family history includes Cervical cancer in her paternal grandmother; Diabetes in her maternal grandmother; Heart disease in her maternal grandfather and maternal grandmother; Kidney disease in her paternal grandmother.  Wt Readings from Last 3 Encounters:  12/01/17 213 lb 9.6 oz (96.9  kg)  11/21/17 211 lb (95.7 kg)  05/24/17 207 lb 9.6 oz (94.2 kg)    PHYSICAL EXAM BP 115/70   Pulse 65   Ht 5' 1"  (1.549 m)   Wt 213 lb 9.6 oz (96.9 kg)   BMI 40.36 kg/m  Physical Exam  Constitutional: She is oriented to person, place, and time. She appears well-developed and well-nourished. No distress.  Moderate to severely obese.  Well-groomed  HENT:  Head: Normocephalic and atraumatic.  Eyes: EOM are normal. No scleral icterus.  Neck: No hepatojugular reflux and no JVD present. Carotid bruit is not present.  Cardiovascular: Normal rate, regular rhythm, normal heart sounds and intact distal pulses.  No extrasystoles are present. PMI is not displaced. Exam reveals no gallop and no friction rub.  No murmur heard. Pulmonary/Chest: Effort normal and breath sounds normal. No respiratory distress. She has no wheezes. She has no rales.  Tenderness to palpation along sternal border (sternal scar)  Abdominal: Soft. Bowel sounds are normal. She exhibits no distension. There is no tenderness. There is no rebound.  Musculoskeletal: Normal range of  motion. She exhibits no edema (trace).  Neurological: She is alert and oriented to person, place, and time.  Skin: Skin is warm.  Psychiatric: She has a normal mood and affect. Her behavior is normal. Judgment and thought content normal.  Happy mood  Vitals reviewed.   Adult ECG Report  Other studies Reviewed: Additional studies/ records that were reviewed today include:  Recent Labs: Lipids and diabetes been managed by the [place]. --She has the arm glucometer sensor (I will provide prescriptions for her supplies. Lab Results  Component Value Date   CREATININE 0.90 12/27/2016   BUN 8 12/27/2016   NA 140 12/27/2016   K 4.1 12/27/2016   CL 103 12/27/2016   CO2 22 12/27/2016   Lab Results  Component Value Date   CHOL 132 11/23/2016   HDL 38 (L) 11/23/2016   LDLCALC 73 11/23/2016   TRIG 103 11/23/2016   CHOLHDL 3.5 11/23/2016   Lab Results  Component Value Date   HGBA1C 9.4 (H) 11/24/2016    ASSESSMENT / PLAN: Problem List Items Addressed This Visit    CAD, multiple vessel (Chronic)   Relevant Medications   nitroGLYCERIN (NITROSTAT) 0.4 MG SL tablet   Other Relevant Orders   EKG 12-Lead   ECHOCARDIOGRAM COMPLETE   Dyslipidemia, goal LDL below 70 (Chronic)    Unfortunately, I do not have her labs in the [place] --last labs I have are from October 2018 -showing borderline adequate control..  She is on high-dose atorvastatin.  I have asked that she bring in labs from the [place] when she comes back for follow-up after her echocardiogram and monitor. For now continue current dose of statin..      Relevant Medications   nitroGLYCERIN (NITROSTAT) 0.4 MG SL tablet   Essential hypertension (Chronic)   Relevant Medications   nitroGLYCERIN (NITROSTAT) 0.4 MG SL tablet   Hypertensive cardiovascular disease (Chronic)    She never really had a formal transthoracic echocardiogram done.  Now that she has symptoms that are concerning for possible A. fib, I would like to ensure that there is no  significant structural normality. Plan: Check 2D echo. Blood pressure is well controlled with Toprol. Euvolemic on exam.  No requirement for diuretic.      Relevant Medications   nitroGLYCERIN (NITROSTAT) 0.4 MG SL tablet   Insulin dependent diabetes mellitus (HCC) (Chronic)    Diabetes medications have been adjusted  by her [place] doctor.  Unfortunately they do not prescribe the percutaneous sensor.  I am therefore providing refills for this until she does establish a civilian PCP. Again consider the possibility of using Jardiance for cardiovascular and diabetic benefit.      Morbid obesity ([place]) BMI 38 with hyperlipidemia, diabetes and CAD (Chronic)    Still much everything she should be.  I expressed the importance of trying to get increase her exercise level.      Rapid palpitations - Primary    Symptoms sound somewhat concerning for A. fib.  We discussed the pathophysiology of A. fib. Plan: 2-week ZIO patch monitor; 2D echo  She is on Plavix for CAD.  If we do need anti-correlation, would simply convert from Plavix to DOAC.      Relevant Orders   EKG 12-Lead   ECHOCARDIOGRAM COMPLETE   CARDIAC EVENT MONITOR   Shortness of breath    Not sure how much this is related to deconditioning and obesity versus hypertensive heart disease.  Checking 2D echocardiogram just to assess the extent of diastolic dysfunction.      Relevant Orders   EKG 12-Lead   ECHOCARDIOGRAM COMPLETE   CARDIAC EVENT MONITOR      Current medicines are reviewed at length with the patient today. (+/- concerns) n/a The following changes have been made: see below  Patient Instructions  Bypass Grafts:  LIMA-LAD, SVG-D1, SVG-OM, SVG-rPL  Medication Instructions:  Not needed If you need a refill on your cardiac medications before your next appointment, please call your pharmacy.   Lab work: Not needed If you have labs (blood work) drawn today and your tests are completely normal, you will receive your  results only by: Marland Kitchen MyChart Message (if you have MyChart) OR . A paper copy in the mail If you have any lab test that is abnormal or we need to change your treatment, we will call you to review the results.  Testing/Procedures: Schedule at [place] has requested that you have an echocardiogram. Echocardiography is a painless test that uses sound waves to create images of your heart. It provides your doctor with information about the size and shape of your heart and how well your heart's chambers and valves are working. This procedure takes approximately one hour. There are no restrictions for this procedure. And Your physician has recommended that you wear an event monitor 30 DAYS. Event monitors are medical devices that record the heart's electrical activity. Doctors most often [place] these monitors to diagnose arrhythmias. Arrhythmias are problems with the speed or rhythm of the heartbeat. The monitor is a small, portable device. You can wear one while you do your normal daily activities. This is usually used to diagnose what is causing palpitations/syncope (passing out).    Follow-Up: At [facility], you and your health needs are our priority.  As part of our continuing mission to provide you with exceptional heart care, we have created designated Provider Care Teams.  These Care Teams include your primary Cardiologist (physician) and Advanced Practice Providers (APPs -  Physician Assistants and Nurse Practitioners) who all work together to provide you with the care you need, when you need it.  Your physician recommends that you schedule a follow-up appointment in 2 [place].  Any Other Special Instructions Will Be Listed Below (If Applicable).    Studies Ordered:   Orders Placed This Encounter  Procedures  . CARDIAC EVENT MONITOR  . EKG  12-Lead  . ECHOCARDIOGRAM COMPLETE      Glenetta Hew, M.D., M.S. Interventional  Cardiologist   Pager # [phone] Phone # [phone] [address] [place], [place] [address]   Thank you for choosing Heartcare at [facility]!!

## 2017-12-01 NOTE — Patient Instructions (Addendum)
Bypass Grafts:  LIMA-LAD, SVG-D1, SVG-OM, SVG-rPL  Medication Instructions:  Not needed If you need a refill on your cardiac medications before your next appointment, please call your pharmacy.   Lab work: Not needed If you have labs (blood work) drawn today and your tests are completely normal, you will receive your results only by: Marland Kitchen MyChart Message (if you have MyChart) OR . A paper copy in the mail If you have any lab test that is abnormal or we need to change your treatment, we will call you to review the results.  Testing/Procedures: Schedule at [place] has requested that you have an echocardiogram. Echocardiography is a painless test that uses sound waves to create images of your heart. It provides your doctor with information about the size and shape of your heart and how well your heart's chambers and valves are working. This procedure takes approximately one hour. There are no restrictions for this procedure. And Your physician has recommended that you wear an event monitor 30 DAYS. Event monitors are medical devices that record the heart's electrical activity. Doctors most often [place] these monitors to diagnose arrhythmias. Arrhythmias are problems with the speed or rhythm of the heartbeat. The monitor is a small, portable device. You can wear one while you do your normal daily activities. This is usually used to diagnose what is causing palpitations/syncope (passing out).    Follow-Up: At [facility], you and your health needs are our priority.  As part of our continuing mission to provide you with exceptional heart care, we have created designated Provider Care Teams.  These Care Teams include your primary Cardiologist (physician) and Advanced Practice Providers (APPs -  Physician Assistants and Nurse Practitioners) who all work together to provide you with the care you need, when you need it.  Your physician recommends that you schedule  a follow-up appointment in 2 [place].  Any Other Special Instructions Will Be Listed Below (If Applicable).

## 2017-12-01 NOTE — Assessment & Plan Note (Signed)
Still much everything she should be.  I expressed the importance of trying to get increase her exercise level.

## 2017-12-01 NOTE — Assessment & Plan Note (Addendum)
Symptoms sound somewhat concerning for A. fib.  We discussed the pathophysiology of A. fib. Plan: 2-week ZIO patch monitor; 2D echo  She is on Plavix for CAD.  If we do need anti-correlation, would simply convert from Plavix to DOAC.

## 2017-12-01 NOTE — Assessment & Plan Note (Signed)
She never really had a formal transthoracic echocardiogram done.  Now that she has symptoms that are concerning for possible A. fib, I would like to ensure that there is no significant structural normality. Plan: Check 2D echo. Blood pressure is well controlled with Toprol. Euvolemic on exam.  No requirement for diuretic.

## 2017-12-01 NOTE — Assessment & Plan Note (Addendum)
Unfortunately, I do not have her labs in the [place] --last labs I have are from October 2018 -showing borderline adequate control..  She is on high-dose atorvastatin.  I have asked that she bring in labs from the [place] when she comes back for follow-up after her echocardiogram and monitor. For now continue current dose of statin.[name] [name]

## 2017-12-02 HISTORY — PX: TRANSTHORACIC ECHOCARDIOGRAM: SHX275

## 2017-12-02 HISTORY — PX: OTHER SURGICAL HISTORY: SHX169

## 2017-12-13 ENCOUNTER — Other Ambulatory Visit: Payer: Self-pay

## 2017-12-13 ENCOUNTER — Ambulatory Visit (INDEPENDENT_AMBULATORY_CARE_PROVIDER_SITE_OTHER): Payer: PRIVATE HEALTH INSURANCE

## 2017-12-13 ENCOUNTER — Ambulatory Visit (HOSPITAL_COMMUNITY): Payer: PRIVATE HEALTH INSURANCE | Attending: Cardiology

## 2017-12-13 DIAGNOSIS — I2510 Atherosclerotic heart disease of native coronary artery without angina pectoris: Secondary | ICD-10-CM

## 2017-12-13 DIAGNOSIS — R0602 Shortness of breath: Secondary | ICD-10-CM

## 2017-12-13 DIAGNOSIS — R002 Palpitations: Secondary | ICD-10-CM

## 2018-02-09 ENCOUNTER — Ambulatory Visit (INDEPENDENT_AMBULATORY_CARE_PROVIDER_SITE_OTHER): Payer: PRIVATE HEALTH INSURANCE | Admitting: Cardiology

## 2018-02-09 ENCOUNTER — Encounter: Payer: Self-pay | Admitting: Cardiology

## 2018-02-09 VITALS — BP 118/80 | HR 73 | Ht 61.0 in | Wt 218.4 lb

## 2018-02-09 DIAGNOSIS — I10 Essential (primary) hypertension: Secondary | ICD-10-CM | POA: Diagnosis not present

## 2018-02-09 DIAGNOSIS — Z794 Long term (current) use of insulin: Secondary | ICD-10-CM

## 2018-02-09 DIAGNOSIS — R002 Palpitations: Secondary | ICD-10-CM | POA: Diagnosis not present

## 2018-02-09 DIAGNOSIS — I119 Hypertensive heart disease without heart failure: Secondary | ICD-10-CM

## 2018-02-09 DIAGNOSIS — IMO0001 Reserved for inherently not codable concepts without codable children: Secondary | ICD-10-CM

## 2018-02-09 DIAGNOSIS — E785 Hyperlipidemia, unspecified: Secondary | ICD-10-CM | POA: Diagnosis not present

## 2018-02-09 DIAGNOSIS — E119 Type 2 diabetes mellitus without complications: Secondary | ICD-10-CM

## 2018-02-09 DIAGNOSIS — I2510 Atherosclerotic heart disease of native coronary artery without angina pectoris: Secondary | ICD-10-CM | POA: Diagnosis not present

## 2018-02-09 LAB — COMPREHENSIVE METABOLIC PANEL
ALBUMIN: 4 g/dL (ref 3.5–5.5)
ALT: 15 IU/L (ref 0–32)
AST: 18 IU/L (ref 0–40)
Albumin/Globulin Ratio: 1.3 (ref 1.2–2.2)
Alkaline Phosphatase: 127 IU/L — ABNORMAL HIGH (ref 39–117)
BUN/Creatinine Ratio: 13 (ref 9–23)
BUN: 11 mg/dL (ref 6–24)
Bilirubin Total: 0.5 mg/dL (ref 0.0–1.2)
CO2: 22 mmol/L (ref 20–29)
Calcium: 8.9 mg/dL (ref 8.7–10.2)
Chloride: 102 mmol/L (ref 96–106)
Creatinine, Ser: 0.84 mg/dL (ref 0.57–1.00)
GFR, EST AFRICAN AMERICAN: 95 mL/min/{1.73_m2} (ref >59)
GFR, EST NON AFRICAN AMERICAN: 82 mL/min/{1.73_m2} (ref >59)
Globulin, Total: 3 g/dL (ref 1.5–4.5)
Glucose: 202 mg/dL — ABNORMAL HIGH (ref 65–99)
Potassium: 4.3 mmol/L (ref 3.5–5.2)
Sodium: 137 mmol/L (ref 134–144)
TOTAL PROTEIN: 7 g/dL (ref 6.0–8.5)

## 2018-02-09 LAB — LIPID PANEL
CHOLESTEROL TOTAL: 156 mg/dL (ref 100–199)
Chol/HDL Ratio: 3.2 ratio (ref 0.0–4.4)
HDL: 49 mg/dL (ref >39)
LDL Calculated: 93 mg/dL (ref 0–99)
TRIGLYCERIDES: 70 mg/dL (ref 0–149)
VLDL Cholesterol Cal: 14 mg/dL (ref 5–40)

## 2018-02-09 MED ORDER — METOPROLOL SUCCINATE ER 25 MG PO TB24: 25.0000 mg | ORAL_TABLET | Freq: Every day | ORAL | 3 refills | Status: DC

## 2018-02-09 NOTE — Patient Instructions (Signed)
Medication Instructions:    NO CHANGES WITH MEDICATION DR HARDING WOULD LIKE YOU TO TAKE METOPROLOL SUCCINATE ( TOPROL XL ) 25 MG TABLETS ONE DAILY- PRESCRIPTION GVEN TO TAKE TO V.A.  If you need a refill on your cardiac medications before your next appointment, please call your pharmacy.   Lab work: [place] If you have labs (blood work) drawn today and your tests are completely normal, you will receive your results only by: Marland Kitchen MyChart Message (if you have MyChart) OR . A paper copy in the mail If you have any lab test that is abnormal or we need to change your treatment, we will call you to review the results.  Testing/Procedures: NOT NEEDED  Follow-Up: At [facility], you and your health needs are our priority.  As part of our continuing mission to provide you with exceptional heart care, we have created designated Provider Care Teams.  These Care Teams include your primary Cardiologist (physician) and Advanced Practice Providers (APPs -  Physician Assistants and Nurse Practitioners) who all work together to provide you with the care you need, when you need it. You will need a follow up appointment in 7 months July OR AUG 2020.  Please call our office 2 months in advance to schedule this appointment.  You may see Glenetta Hew, MD or one of the following Advanced Practice Providers on your designated Care Team:   Rosaria Ferries, PA-C . Jory Sims, DNP, ANP  Any Other Special Instructions Will Be Listed Below (If Applicable).

## 2018-02-09 NOTE — Progress Notes (Signed)
PCP: [place]  --> She is an employee of [employer], and was hoping to be followed up by cardiologist at [employer] [facility] Note: Chief Complaint  Patient presents with  . Follow-up    Echo and monitor results  . Palpitations    Much better  . Coronary Artery Disease    No angina    HPI: [name] [name] is a 48 y.o. female with a PMH of CAD-CABG and palpitations presenting for 56-monthfollow-up.  (I did  CARDIAC HISTORY:  Non-STEMI November 22, 2016 --> occluded distal RCA with multivessel disease referred for CABGx4  (LIMA-LAD, SVG-D1, SVG-OM, SVG-RPL.) October 26 with Dr. ORoxy Manns.Marny Lowensteinwas seen on October 31 actually noticing episodes of rapid irregular heartbeats lasting anywhere from 15 minutes to an hour.  Associated lightheaded notes but no dizziness or wooziness.  A little bit of dyspnea but no chest pain..Marland Kitchen  Recent Hospitalizations:   none   Studies Personally Reviewed - (if available, images/films reviewed: From Epic Chart or Care Everywhere)  Transthoracic Echo December 13, 2017: Normal LV size and function.  EF 55 to 60%.  GR 3 DD.  Moderate LA dilation.  Event monitor Nov 2019:  Patient monitored for 17d 12h 540m No arrhythmias noted.  No SVT or PAT, PAF . 26 events were transmitted. 6 symptomatic (all episodes noted with sinus rhythm rates in the 90s); 20 device triggered . 9,487 PACs with PAC burden of <1%; . 5,247 PVCs with PVC burden of <1%  Interval History: SoAlleen Borneeturns today overall doing fairly well.  She says her palpitations are not too bad.  Actually a lot better than the last visit.  She is not having any rapid irregular heartbeats.  Just occasional flip-flops.  Overall seems to be stable.  Edema seems to be pretty well controlled by elevating her feet.  Otherwise no real PND orthopnea. She has not had any chest pain or pressure with rest or exertion.  No resting exertional dyspnea unless she overdoes it  she gets a little short of breath.  She is not as active as she would like to be.  Wanted to wait and see how this visit went prior to her considering restarting exercise.  No syncope/near syncope or TIA/amaurosis fugax.  No melena, hematochezia, hematuria or epistaxis.  No recent UC flares.  She thought she may have had one starting up, but it seems to have resolved.  ROS: A comprehensive was performed. Review of Systems  Constitutional: Negative for malaise/fatigue and weight loss (She unfortunately put on weight during the Thanksgiving-Christmas time frame).  HENT: Negative for congestion and nosebleeds.   Respiratory: Negative for cough and shortness of breath.   Gastrointestinal:       No recent UC flares  Musculoskeletal: Negative for joint pain.  Endo/Heme/Allergies: Bruises/bleeds easily (Has a pretty significant bruise on the left arm from a previous lab draw).  Psychiatric/Behavioral: Negative for depression. The patient is not nervous/anxious (Much less anxious.).     I have reviewed and (if needed) personally updated the patient's problem list, medications, allergies, past medical and surgical history, social and family history.   Past Medical History:  Diagnosis Date  . Allergic rhinitis   . Anemia   . Arthritis    "mostly in my hands" (11/23/2016)  . Borderline high cholesterol   . 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  Severe  75-80% proximal LAD and proximal Circumflex disease. --Referred for CABG  . Endometriosis   . Fibromyalgia   . GERD (gastroesophageal reflux disease)   . Grave's disease    S/P irradiation  . Hypothyroid    S/P radiation  . Left sided ulcerative colitis ([place])   . Migraine headache    "~ 4/month" (11/23/2016)  . NSTEMI (non-ST elevated myocardial infarction) ([place]) 11/22/2016  . 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 Sleep apnea    "couldn't tolerate the mask; only had a slight case of it" (11/23/2016)  . Type II diabetes mellitus ([place])   . Vasculitis [facility])     Past Surgical History:  Procedure Laterality Date  . ABDOMINAL HYSTERECTOMY  07/2006   "Davinci; partial"  . CARDIAC EVENT MONITOR  12/2017   Patient monitored for 17d 12h 20m  No arrhythmias noted.  No SVT or PAT, PAF.  < 1% PAC or PVC.  .Marland KitchenCESAREAN SECTION  1994  . COLONOSCOPY W/ BIOPSIES AND POLYPECTOMY  03/2008- 2017 X "4 at least"  . CORONARY ARTERY BYPASS GRAFT N/A 11/26/2016   Procedure: CORONARY ARTERY BYPASS GRAFTING (CABG), ON PUMP, TIMES FOUR, USING LEFT INTERNAL MAMMARY ARTERY AND ENDOSCOPICALLY HARVESTED RIGHT GREATER SAPHENOUS VEIN;  Surgeon: ORexene Alberts MD;  [facility]  Service: Open Heart Surgery;  Laterality: N/A;  LIMA-LAD, SVG-OM, SVG-PL, SVG-DIAG  . DILATION AND CURETTAGE OF UTERUS  1996  . LEFT HEART CATH AND CORONARY ANGIOGRAPHY N/A 11/23/2016   Procedure: LEFT HEART CATH AND CORONARY ANGIOGRAPHY;  Surgeon: CSherren Mocha MD;  [facility];  Service: Cardiovascular:  Diffuse proximal RCA with 100% occluded distal RCA.  Severe 75-80% proximal LAD and proximal Circumflex disease.  Mild LV dysfunction with akinesis in the mid inferior wall through the anterolateral and periapical walls. --Referred for CABG  . TEE WITHOUT CARDIOVERSION N/A 11/26/2016   Procedure: TRANSESOPHAGEAL ECHOCARDIOGRAM (TEE);  Surgeon: ORexene Alberts MD;  [facility];  Service: Open Heart Surgery;;; EF 50-55% with GRII DD.  Mild mitral valve leaflet thickening.  Trace TR.  .Marland KitchenTRANSTHORACIC ECHOCARDIOGRAM  12/2017   Normal LV size and function.  EF 55 to 60%.  ?GR 3 DD.  Moderate LA dilation.    Current Meds  Medication Sig  . atorvastatin (LIPITOR) 80 MG tablet Take 1 tablet (80 mg total) by mouth daily at 6 PM.  . clopidogrel (PLAVIX) 75 MG tablet Take 1 tablet (75 mg total) by mouth daily.  . Continuous  Blood Gluc Sensor (FREESTYLE LIBRE 14 DAY SENSOR) MISC 2 Doses/Fill by Does not apply route every 14 (fourteen) days. APPLY 1 SENSOR EVERY 14 DAYS AS DIRECTED  . insulin glargine (LANTUS) 100 UNIT/ML injection Inject 0.25 mLs (25 Units total) into the skin at bedtime. (Patient taking differently: Inject 40 Units into the skin at bedtime. )  . levothyroxine (SYNTHROID) 100 MCG tablet Take 1 tablet (100 mcg total) by mouth daily before breakfast.  . metoprolol succinate (TOPROL-XL) 25 MG 24 hr tablet Take 1 tablet (25 mg total) by mouth daily.  . nitroGLYCERIN (NITROSTAT) 0.4 MG SL tablet Place 1 tablet (0.4 mg total) under the tongue every 5 (five) minutes as needed for chest pain.  .Marland Kitchenomeprazole (PRILOSEC) 20 MG capsule Take 2 tablets by mouth once daily  . SUMAtriptan (IMITREX) 100 MG tablet Take 50-100 mg  by mouth 2 (two) times daily as needed for migraine. May repeat in 2 hours if headache persists or recurs.  . [DISCONTINUED] metoprolol succinate (TOPROL-XL) 25 MG 24 hr tablet Take 25 mg by mouth daily.    Allergies  Allergen Reactions  . Glimepiride Diarrhea  . Shellfish Allergy Hives and Swelling    And hives    Social History   Tobacco Use  . Smoking status: Never Smoker  . Smokeless tobacco: Never Used  Substance Use Topics  . Alcohol use: No  . Drug use: No   Social History   Social History Narrative  . Not on file    family history includes Cervical cancer in her paternal grandmother; Diabetes in her maternal grandmother; Heart disease in her maternal grandfather and maternal grandmother; Kidney disease in her paternal grandmother.  Wt Readings from Last 3 Encounters:  02/09/18 218 lb 6.4 oz (99.1 kg)  12/01/17 213 lb 9.6 oz (96.9 kg)  11/21/17 211 lb (95.7 kg)    PHYSICAL EXAM BP 118/80   Pulse 73   Ht 5' 1"  (1.549 m)   Wt 218 lb 6.4 oz (99.1 kg)   BMI 41.27 kg/m  Physical Exam  Constitutional: She is oriented to person, place, and time. She appears  well-developed and well-nourished. No distress.  Moderate to severely obese.  Well-groomed  HENT:  Head: Normocephalic and atraumatic.  Neck: No hepatojugular reflux and no JVD present. Carotid bruit is not present.  Cardiovascular: Normal rate, regular rhythm, normal heart sounds, intact distal pulses and normal pulses.  No extrasystoles are present. PMI is not displaced. Exam reveals no gallop and no friction rub.  No murmur heard. Pulmonary/Chest: Effort normal and breath sounds normal. No respiratory distress. She has no wheezes. She has no rales. She exhibits tenderness (Tenderness to palpation along sternal border (sternal scar)).  Musculoskeletal: Normal range of motion.        General: No edema (trace).  Neurological: She is alert and oriented to person, place, and time.  Psychiatric: She has a normal mood and affect. Her behavior is normal. Judgment and thought content normal.  Happy mood  Vitals reviewed.   Adult ECG Report  Rate: 73 ;  Rhythm: normal sinus rhythm and Left atrial large and.  Exclude anterior MI, age undetermined.  Cannot exclude inferior MI, age undetermined.;  Poor R wave progression.  Narrative Interpretation: Stable EKG compared to last  Other studies Reviewed: Additional studies/ records that were reviewed today include:  Recent Labs: Lipids and diabetes been managed by the [place]. (Not available) Checked today - reviewed below in Addendum. Lab Results  Component Value Date   CREATININE 0.90 12/27/2016   BUN 8 12/27/2016   NA 140 12/27/2016   K 4.1 12/27/2016   CL 103 12/27/2016   CO2 22 12/27/2016   Lab Results  Component Value Date   CHOL 132 11/23/2016   HDL 38 (L) 11/23/2016   LDLCALC 73 11/23/2016   TRIG 103 11/23/2016   CHOLHDL 3.5 11/23/2016   Lab Results  Component Value Date   HGBA1C 9.4 (H) 11/24/2016    ASSESSMENT / PLAN: Problem List Items Addressed This Visit    CAD, multiple vessel; s/p CABG. No further Angina (Chronic)    Doing  well about 2 years post CABG.  No further anginal symptoms.  No CHF symptoms with relatively normal echocardiogram.  Continuing Plavix for secondary prevention. On stable dose of Toprol and atorvastatin. Grade 3 diastolic function noted on echo, but seems  euvolemic with no heart failure symptoms.  Monitor for signs of heart failure.      Relevant Medications   metoprolol succinate (TOPROL-XL) 25 MG 24 hr tablet   Other Relevant Orders   EKG 12-Lead (Completed)   Comprehensive metabolic panel (Completed)   Dyslipidemia, goal LDL below 70 (Chronic)    Last set of labs are quite old.  We will check labs today and reassess.  Currently on atorvastatin.  If not at goal would then probably add Zetia and consider referral to [facility] lipid clinic.    Will add addendum once labs result.      Relevant Medications   metoprolol succinate (TOPROL-XL) 25 MG 24 hr tablet   Other Relevant Orders   Lipid panel (Completed)   Comprehensive metabolic panel (Completed)   Essential hypertension (Chronic)    Blood pressure looks great on low-dose Toprol.  With no signs of heart failure, no need for ARB or ACE inhibitor despite grade 3 diastolic dysfunction noted on echo.  Low threshold to consider ARB, however with her diabetes if her pressure tends to increase.      Relevant Medications   metoprolol succinate (TOPROL-XL) 25 MG 24 hr tablet   Hypertensive cardiovascular disease (Chronic)    Grade 3 diastolic dysfunction noted on echo, however she seems pretty much euvolemic with no heart failure symptoms.  Blood pressure is well controlled on Toprol.    As noted in hypertension section, would consider adding ARB if necessary, but at this point with her current blood pressure I would be reluctant to do so.      Relevant Medications   metoprolol succinate (TOPROL-XL) 25 MG 24 hr tablet   Other Relevant Orders   EKG 12-Lead (Completed)   Insulin dependent diabetes mellitus (HCC) (Chronic)    In lieu of  her not having a PCP outside of the [place], we will continue to refill her Freestyle Libre 14-day sensor.      Morbid obesity (HCC) BMI 38 with hyperlipidemia, diabetes and CAD (Chronic)    Needs to get back and exercising.  Now that her results look relatively reassuring, I recommended that she does get back and exercise as this is the best way to determine the true extent of symptoms from diastolic dysfunction.      Rapid palpitations - Primary (Chronic)    Symptoms pretty much resolved.  Nothing really noted on monitor.  No true arrhythmias noted.  Nothing to suggest any A. Fib.  If symptoms were to recur, may need to consider portable outpatient monitor application with smart phone.      Relevant Orders   EKG 12-Lead (Completed)      Current medicines are reviewed at length with the patient today. (+/- concerns) n/a The following changes have been made: see below  Patient Instructions  Medication Instructions:    NO CHANGES WITH MEDICATION DR  WOULD LIKE YOU TO TAKE METOPROLOL SUCCINATE ( TOPROL XL ) 25 MG TABLETS ONE DAILY- PRESCRIPTION GVEN TO TAKE TO [facility]  If you need a refill on your cardiac medications before your next appointment, please call your pharmacy.   Lab work: [place] If you have labs (blood work) drawn today and your tests are completely normal, you will receive your results only by: Marland Kitchen MyChart Message (if you have MyChart) OR . A paper copy in the mail If you have any lab test that is abnormal or we need to change your treatment, we will call you to review  the results.  Testing/Procedures: NOT NEEDED  Follow-Up: At [facility], you and your health needs are our priority.  As part of our continuing mission to provide you with exceptional heart care, we have created designated Provider Care Teams.  These Care Teams include your primary Cardiologist (physician) and Advanced Practice Providers (APPs -  Physician Assistants and Nurse  Practitioners) who all work together to provide you with the care you need, when you need it. You will need a follow up appointment in 7 months July OR AUG 2020.  Please call our office 2 months in advance to schedule this appointment.  You may see Glenetta Hew, MD or one of the following Advanced Practice Providers on your designated Care Team:   Rosaria Ferries, PA-C . Jory Sims, DNP, ANP  Any Other Special Instructions Will Be Listed Below (If Applicable).    Studies Ordered:   Orders Placed This Encounter  Procedures  . Lipid panel  . Comprehensive metabolic panel  . EKG 12-Lead   ADDENDUM:  Lab Results Reviewed:  Lab Results  Component Value Date   CHOL 156 02/09/2018   HDL 49 02/09/2018   LDLCALC 93 02/09/2018   TRIG 70 02/09/2018   CHOLHDL 3.2 02/09/2018   Lab Results  Component Value Date   CREATININE 0.84 02/09/2018   BUN 11 02/09/2018   NA 137 02/09/2018   K 4.3 02/09/2018   CL 102 02/09/2018   CO2 22 02/09/2018   Result Notes for Comprehensive metabolic panel   Notes recorded by Leonie Man, MD on 02/12/2018 at 10:02 AM EST Cholesterol levels unfortunately show that the LDL level is gone up from 73 which is almost at goal up to 93 which is well above goal. Our real target is 50.  For now let us add Zetia 10 mg daily. Recheck labs in 3 months.  At that point, if still not at goal, I want to recommend referral to our cardiovascular risk reduction clinic-lipid clinic (CV [facility] -run by our clinical pharmacist) in order to determine if there is any potential options beyond statin and Zetia.     Glenetta Hew, M.D., M.S. Interventional Cardiologist   Pager # [phone] Phone # [phone] [address] [place], [place] [address]   Thank you for choosing Heartcare at [facility]!!

## 2018-02-12 ENCOUNTER — Encounter: Payer: Self-pay | Admitting: Cardiology

## 2018-02-12 NOTE — Assessment & Plan Note (Signed)
Grade 3 diastolic dysfunction noted on echo, however she seems pretty much euvolemic with no heart failure symptoms.  Blood pressure is well controlled on Toprol.    As noted in hypertension section, would consider adding ARB if necessary, but at this point with her current blood pressure I would be reluctant to do so.

## 2018-02-12 NOTE — Assessment & Plan Note (Signed)
Doing well about 2 years post CABG.  No further anginal symptoms.  No CHF symptoms with relatively normal echocardiogram.  Continuing Plavix for secondary prevention. On stable dose of Toprol and atorvastatin. Grade 3 diastolic function noted on echo, but seems euvolemic with no heart failure symptoms.  Monitor for signs of heart failure.

## 2018-02-12 NOTE — Assessment & Plan Note (Signed)
Symptoms pretty much resolved.  Nothing really noted on monitor.  No true arrhythmias noted.  Nothing to suggest any A. Fib.  If symptoms were to recur, may need to consider portable outpatient monitor application with smart phone.

## 2018-02-12 NOTE — Assessment & Plan Note (Signed)
Needs to get back and exercising.  Now that her results look relatively reassuring, I recommended that she does get back and exercise as this is the best way to determine the true extent of symptoms from diastolic dysfunction.

## 2018-02-12 NOTE — Assessment & Plan Note (Signed)
In lieu of her not having a PCP outside of the [place], we will continue to refill her Freestyle Libre 14-day sensor.

## 2018-02-12 NOTE — Assessment & Plan Note (Addendum)
Blood pressure looks great on low-dose Toprol.  With no signs of heart failure, no need for ARB or ACE inhibitor despite grade 3 diastolic dysfunction noted on echo.  Low threshold to consider ARB, however with her diabetes if her pressure tends to increase.

## 2018-02-12 NOTE — Assessment & Plan Note (Signed)
Last set of labs are quite old.  We will check labs today and reassess.  Currently on atorvastatin.  If not at goal would then probably add Zetia and consider referral to CV [facility] lipid clinic.    Will add addendum once labs result.

## 2018-02-16 ENCOUNTER — Telehealth: Payer: Self-pay | Admitting: *Deleted

## 2018-02-16 DIAGNOSIS — I2510 Atherosclerotic heart disease of native coronary artery without angina pectoris: Secondary | ICD-10-CM

## 2018-02-16 DIAGNOSIS — E785 Hyperlipidemia, unspecified: Secondary | ICD-10-CM

## 2018-02-16 MED ORDER — EZETIMIBE 10 MG PO TABS: 10.0000 mg | ORAL_TABLET | Freq: Every day | ORAL | 11 refills | Status: AC

## 2018-02-16 NOTE — Telephone Encounter (Signed)
PATIENT REVIEWED VIA MY CHART --The patient has been notified of the result and verbalized understanding.  All questions (if any) were answered. Raiford Simmonds, RN 02/16/2018 3:01 PM    PATIENT WOULD LIKE MEDICATION E-SENT TO LOCAL PHARMACY WITH  30 DAY SUPPLY AND REFILLS PATIENT AT HIS TIME WOULD LIKE TO SEE  CLINICAL PHARMACIST AT [facility] TO DISCUSS CHOLESTEROL OPTIONS   PATIENT AWARE MEDICATION E-SENT TO PHARMACY AND WILL MAIL LABSLIP IN 3 MONTH.

## 2018-02-16 NOTE — Telephone Encounter (Signed)
-----   Message from Leonie Man, MD sent at 02/12/2018 10:02 AM EST ----- Cholesterol levels unfortunately show that the LDL level is gone up from 73 which is almost at goal up to 93 which is well above goal.  Our real target is 50.  For now let us add Zetia 10 mg daily.  Recheck labs in 3 months.  At that point, if still not at goal, I want to recommend referral to our cardiovascular risk reduction clinic-lipid clinic (CV RR -run by our clinical pharmacist) in order to determine if there is any potential options beyond statin and Zetia.  Glenetta Hew, MD

## 2018-05-16 ENCOUNTER — Telehealth: Payer: Self-pay | Admitting: *Deleted

## 2018-05-16 ENCOUNTER — Other Ambulatory Visit: Payer: Self-pay | Admitting: *Deleted

## 2018-05-16 DIAGNOSIS — I2510 Atherosclerotic heart disease of native coronary artery without angina pectoris: Secondary | ICD-10-CM

## 2018-05-16 DIAGNOSIS — E785 Hyperlipidemia, unspecified: Secondary | ICD-10-CM

## 2018-05-16 NOTE — Telephone Encounter (Signed)
Mail letter and labslip

## 2018-05-16 NOTE — Telephone Encounter (Signed)
-----   Message from Raiford Simmonds, RN sent at 02/16/2018  3:08 PM EST ----- MAIL LETTER AND LABSLIP @3 /16/20   DUE 05/18/18 LIPID AND CMP

## 2019-05-22 ENCOUNTER — Encounter: Payer: Self-pay | Admitting: Cardiology

## 2019-05-22 ENCOUNTER — Ambulatory Visit (INDEPENDENT_AMBULATORY_CARE_PROVIDER_SITE_OTHER): Payer: PRIVATE HEALTH INSURANCE | Admitting: Cardiology

## 2019-05-22 ENCOUNTER — Other Ambulatory Visit: Payer: Self-pay

## 2019-05-22 VITALS — BP 124/76 | HR 69 | Temp 97.2°F | Ht 61.0 in | Wt 220.0 lb

## 2019-05-22 DIAGNOSIS — I2510 Atherosclerotic heart disease of native coronary artery without angina pectoris: Secondary | ICD-10-CM | POA: Diagnosis not present

## 2019-05-22 DIAGNOSIS — K5150 Left sided colitis without complications: Secondary | ICD-10-CM

## 2019-05-22 DIAGNOSIS — R002 Palpitations: Secondary | ICD-10-CM

## 2019-05-22 DIAGNOSIS — I214 Non-ST elevation (NSTEMI) myocardial infarction: Secondary | ICD-10-CM | POA: Diagnosis not present

## 2019-05-22 DIAGNOSIS — I10 Essential (primary) hypertension: Secondary | ICD-10-CM

## 2019-05-22 NOTE — Progress Notes (Signed)
Primary Care Provider: [facility] Cardiologist: Glenetta Hew, MD Electrophysiologist: None  Clinic Note: Chief Complaint  Patient presents with  . Follow-up    No major complaints; no palpitations  . Coronary Artery Disease    No angina    HPI:    [name] [name] is a 49 y.o. female with a PMH below who presents today for delayed annual follow-up--she is followed at the [place]*.  She is doing preoperative evaluation for colonoscopy.  CARDIAC HISTORY:  Non-STEMI November 22, 2016 --> occluded distal RCA with multivessel disease referred for CABGx4  (LIMA-LAD, SVG-D1, SVG-OM, SVG-RPL.) October 26 with Dr. Roxy Manns.[name] [name] was last seen on February 09, 2018 in follow-up from an echocardiogram and event monitor.  She is doing well.  Palpitations are pretty well controlled.  Nothing like she had before.  No rapid rates.  Distant flip-flopping.  Edema controlled by elevating her feet.  Only noted exertional dyspnea for overdoing it.  Noted that she is not as active as she wanted to be.  Was hoping to get into some exercise.  She had put on some weight during the holiday season.  Recent Hospitalizations: None  --> She is actually working with a counselor/trainers for weight loss through the [facility] program.   Reviewed  CV studies:    The following studies were reviewed today: (if available, images/films reviewed: From Epic Chart or Care Everywhere) . CARDIAC EVENT MONITOR: (February 2020): Mostly sinus rhythm.  Very rare PACs and PVCs.  Normal study.   Interval History:   [name] [name] returns here today overall doing pretty well from a cardiac standpoint.  She does not have any real complications.  She asked about when she can stop Plavix just noting some bruising but no bleeding.  She did have a colonoscopy but has not had any melena or hematochezia.  She is just due for her surveillance colonoscopy as part of her  inflammatory bowel follow-up.  She denies any further episodes of palpitations rapid irregular heartbeats etc.  No chest pain or pressure.  Essentially no major cardiac symptoms.  CV Review of Symptoms (Summary) Cardiovascular ROS: positive for - Just a little short of breath because of obesity and deconditioning, but she is doing more exercise and this is improving. negative for - chest pain, irregular heartbeat, orthopnea, palpitations, paroxysmal nocturnal dyspnea, rapid heart rate, shortness of breath or Edema pretty well controlled with foot elevation; No syncope/near syncope; TIA/amaurosis fugax or claudication.  The patient does not have symptoms concerning for COVID-19 infection (fever, chills, cough, or new shortness of breath).  The patient is practicing social distancing & Masking.   She has had both COVID-19 vaccine injections.  REVIEWED OF SYSTEMS   Review of Systems  Constitutional: Negative for malaise/fatigue and weight loss.  HENT: Negative for nosebleeds.   Cardiovascular: Negative for leg swelling.  Gastrointestinal: Positive for abdominal pain (Off and on). Negative for blood in stool, diarrhea (Not really having diarrhea, has not had a flare in a long time.) and melena.  Genitourinary: Negative for hematuria.  Musculoskeletal: Positive for joint pain (Mild arthritis pains).  Neurological: Negative for dizziness and focal weakness.  Psychiatric/Behavioral:       She is undergoing counseling, but seems to be doing fine with no active anxiety or depression symptoms.   I have reviewed and (if needed) personally updated the patient's problem list, medications, allergies, past medical and surgical history, social  and family history.   PAST MEDICAL HISTORY   Past Medical History:  Diagnosis Date  . Allergic rhinitis   . Anemia   . Arthritis    "mostly in my hands" (11/23/2016)  . Borderline high cholesterol   . 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  Severe 75-80% proximal LAD and proximal Circumflex disease. --Referred for CABG  . Endometriosis   . Fibromyalgia   . GERD (gastroesophageal reflux disease)   . Grave's disease    S/P irradiation  . Hypothyroid    S/P radiation  . Left sided ulcerative colitis ([place])   . Migraine headache    "~ 4/month" (11/23/2016)  . NSTEMI (non-ST elevated myocardial infarction) ([place]) 11/22/2016  . 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 Sleep apnea    "couldn't tolerate the mask; only had a slight case of it" (11/23/2016)  . Type II diabetes mellitus ([place])   . Vasculitis ([place])     PAST SURGICAL HISTORY   Past Surgical History:  Procedure Laterality Date  . ABDOMINAL HYSTERECTOMY  07/2006   "Davinci; partial"  . CARDIAC EVENT MONITOR  12/2017   Patient monitored for 17d 12h 36m  No arrhythmias noted.  No SVT or PAT, PAF.  < 1% PAC or PVC.  .Marland KitchenCESAREAN SECTION  1994  . COLONOSCOPY W/ BIOPSIES AND POLYPECTOMY  03/2008- 2017 X "4 at least"  . CORONARY ARTERY BYPASS GRAFT N/A 11/26/2016   Procedure: CORONARY ARTERY BYPASS GRAFTING (CABG), ON PUMP, TIMES FOUR, USING LEFT INTERNAL MAMMARY ARTERY AND ENDOSCOPICALLY HARVESTED RIGHT GREATER SAPHENOUS VEIN;  Surgeon: ORexene Alberts MD;  [facility]  Service: Open Heart Surgery;  Laterality: N/A;  LIMA-LAD, SVG-OM, SVG-PL, SVG-DIAG  . DILATION AND CURETTAGE OF UTERUS  1996  . LEFT HEART CATH AND CORONARY ANGIOGRAPHY N/A 11/23/2016   Procedure: LEFT HEART CATH AND CORONARY ANGIOGRAPHY;  Surgeon: CSherren Mocha MD;  [facility];  Service: Cardiovascular:  Diffuse proximal RCA with 100% occluded distal RCA.  Severe 75-80% proximal LAD and proximal Circumflex disease.  Mild LV dysfunction with akinesis in the mid inferior wall through the anterolateral and periapical walls. --Referred for CABG  . TEE WITHOUT  CARDIOVERSION N/A 11/26/2016   Procedure: TRANSESOPHAGEAL ECHOCARDIOGRAM (TEE);  Surgeon: ORexene Alberts MD;  [facility];  Service: Open Heart Surgery;;; EF 50-55% with GRII DD.  Mild mitral valve leaflet thickening.  Trace TR.  .Marland KitchenTRANSTHORACIC ECHOCARDIOGRAM  12/2017   Normal LV size and function.  EF 55 to 60%.  ?GR 3 DD.  Moderate LA dilation.    MEDICATIONS/ALLERGIES   Current Meds  Medication Sig  . clobetasol ointment (TEMOVATE) 0.05 % Apply to affected areas on scalp 4-5 times per week. Not to face.  . clopidogrel (PLAVIX) 75 MG tablet TAKE ONE TABLET BY MOUTH ONCE EVERY DAY BE SURE TO CHECK WITH YOUR DOCTOR ABOUT HOW LONG YOU SHOULD TAKE THIS MEDICINE  . empagliflozin (JARDIANCE) 10 MG TABS tablet TAKE ONE TABLET BY MOUTH EVERY DAY FOR DIABETES  . ezetimibe-simvastatin (VYTORIN) 10-10 MG tablet Take by mouth.  . insulin glargine (LANTUS) 100 UNIT/ML injection Inject 0.25 mLs (25 Units total) into the skin at bedtime. (Patient taking differently: Inject 40 Units into the skin at bedtime. )  . metoprolol succinate (TOPROL-XL) 25 MG 24  hr tablet TAKE ONE TABLET BY MOUTH EVERY DAY FOR HEART AND BLOOD PRESSURE  . omeprazole (PRILOSEC) 20 MG capsule Take 2 tablets by mouth once daily  . SUMAtriptan (IMITREX) 100 MG tablet Take 50-100 mg by mouth 2 (two) times daily as needed for migraine. May repeat in 2 hours if headache persists or recurs.  Marland Kitchen thyroid (ARMOUR) 30 MG tablet Take by mouth.    Allergies  Allergen Reactions  . Glimepiride Diarrhea  . Shellfish Allergy Hives and Swelling    And hives    SOCIAL HISTORY/FAMILY HISTORY   Reviewed in Epic:  Pertinent findings: She is working on diet and exercise with counseling.  She is trying to work on her diabetes management.  She stopped using the arm automatic glucometer because the readings are not as accurate as her fingersticks.  So she has gone back to fingersticks.  OBJCTIVE -PE, EKG, labs   Wt Readings from Last 3  Encounters:  05/22/19 220 lb (99.8 kg)  02/09/18 218 lb 6.4 oz (99.1 kg)  12/01/17 213 lb 9.6 oz (96.9 kg)    Physical Exam: BP 124/76   Pulse 69   Temp (!) 97.2 F (36.2 C)   Ht 5' 1"  (1.549 m)   Wt 220 lb (99.8 kg)   SpO2 99%   BMI 41.57 kg/m  Physical Exam  Constitutional: She is oriented to person, place, and time. She appears well-developed and well-nourished. No distress.  She is still morbidly obese, but otherwise well-groomed and fit.  HENT:  Head: Normocephalic and atraumatic.  Neck: No hepatojugular reflux and no JVD present. Carotid bruit is not present.  Cardiovascular: Normal rate, regular rhythm, normal heart sounds and intact distal pulses.  No extrasystoles are present. PMI is not displaced (Difficult to palpate). Exam reveals no gallop and no friction rub.  No murmur heard. Pulmonary/Chest: Effort normal and breath sounds normal. No respiratory distress. She has no wheezes. She has no rales.  Musculoskeletal:        General: Edema (Trivial bilateral lower extremity) present. Normal range of motion.     Cervical back: Normal range of motion and neck supple.  Neurological: She is alert and oriented to person, place, and time.  Psychiatric: She has a normal mood and affect. Her behavior is normal. Judgment and thought content normal.  Vitals reviewed.   Adult ECG Report  Rate: 69 ;  Rhythm: normal sinus rhythm and Cannot exclude inferior MI, age undetermined.  Normal axis, intervals and durations.;   Narrative Interpretation: Stable EKG.  Recent Labs:    She says she had labs recently checked by [facility] Lab Results  Component Value Date   CHOL 156 02/09/2018   HDL 49 02/09/2018   LDLCALC 93 02/09/2018   TRIG 70 02/09/2018   CHOLHDL 3.2 02/09/2018    Lab Results  Component Value Date   CREATININE 0.84 02/09/2018   BUN 11 02/09/2018   NA 137 02/09/2018   K 4.3 02/09/2018   CL 102 02/09/2018   CO2 22 02/09/2018   Lab Results  Component Value Date   TSH  48.374 (H) 11/24/2016    ASSESSMENT/PLAN    Problem List Items Addressed This Visit    NSTEMI (non-ST elevated myocardial infarction) (HCC) (Chronic)    Preserved EF postop.  Never really had a significant murmur symptomatically.  No angina or heart failure symptoms now.  This is despite having grade 2-3 diastolic dysfunction noted on echo..      Relevant Medications  ezetimibe-simvastatin (VYTORIN) 10-10 MG tablet   metoprolol succinate (TOPROL-XL) 25 MG 24 hr tablet   CAD, multiple vessel; s/p CABG. No further Angina (Chronic)    Doing very well now almost 2 and half years post CABG. she has not had any further angina.  She is on low-dose Toprol and Vytorin.  Echo was suggestive is having significant diastolic dysfunction, but that would not along with her blood pressure and lack of symptoms.  Follow-up echo still showed that in 2019.  Monitor for signs of heart failure and increasing blood pressures.  If they were to increase, would have low threshold to start ARB given her diabetes.  No diuretic requirement.  Plan: Continue current meds but will stop Plavix now and convert to aspirin.      Relevant Medications   ezetimibe-simvastatin (VYTORIN) 10-10 MG tablet   metoprolol succinate (TOPROL-XL) 25 MG 24 hr tablet   Essential hypertension (Chronic)    Blood pressure well controlled on current dose of Toprol.  APP to increase,  next choice medicine will be ARB given diastolic function noted on echo, and diabetes.      Relevant Medications   ezetimibe-simvastatin (VYTORIN) 10-10 MG tablet   metoprolol succinate (TOPROL-XL) 25 MG 24 hr tablet   Morbid obesity (HCC) BMI 38 with hyperlipidemia, diabetes and CAD (Chronic)    She is doing exercise and diet counseling.  Discussed the importance of working on making some headway in this area.      Relevant Medications   empagliflozin (JARDIANCE) 10 MG TABS tablet   Rapid palpitations (Chronic)    Pretty much resolved.  Had a  monitor that did not show any significant findings.  I suspect it was related to anxiety.      ULCERATIVE COLITIS-LEFT SIDE    She asked about stopping Plavix for upcoming colonoscopy.  Plan for now actually be to stop Plavix altogether and go back to aspirin 81 mg.  She is a low risk patient for any colonoscopy procedure and would be okay to hold aspirin if necessary.  No further evaluation required within this year for any planned colonoscopy procedures.Marland Kitchen          COVID-19 Education: The signs and symptoms of COVID-19 were discussed with the patient and how to seek care for testing (follow up with PCP or arrange E-visit).   The importance of social distancing and COVID-19 vaccination was discussed today.  I spent a total of 75mnutes with the patient. >  50% of the time was spent in direct patient consultation.  Additional time spent with chart review  / charting (studies, outside notes, etc): 6 Total Time: 24 min   Current medicines are reviewed at length with the patient today.  (+/- concerns) when can she stop Plavix  Notice: This dictation was prepared with Dragon dictation along with smaller phrase technology. Any transcriptional errors that result from this process are unintentional and may not be corrected upon review.  Patient Instructions / Medication Changes & Studies & Tests Ordered   Patient Instructions  Medication Instructions:  OK TO STOP PLAVIX  START ASPIRIN 81MG  *If you need a refill on your cardiac medications before your next appointment, please call your pharmacy*  Lab Work: [place] [place] If you have labs (blood work) drawn today and your tests are completely normal, you will receive your results only by:  [place](if you have MyChart) OR A paper copy in the mail.  If you have any lab test that is abnormal or we need to change your treatment, we will call you to review the results.   You may go to  any [facility] that is convenient for you however, we do have a lab in our office that is able to assist you. You DO NOT need an appointment for our lab. The lab is open 8:00am and closes at 4:00pm. Lunch 12:45 - 1:45pm.  Follow-Up: Your next appointment:  12 month(s) Please call our office 2 months in advance to schedule this appointment In Person with Glenetta Hew, MD  At [facility], you and your health needs are our priority.  As part of our continuing mission to provide you with exceptional heart care, we have created designated Provider Care Teams.  These Care Teams include your primary Cardiologist (physician) and Advanced Practice Providers (APPs -  Physician Assistants and Nurse Practitioners) who all work together to provide you with the care you need, when you need it.      Studies Ordered:   No orders of the defined types were placed in this encounter.    Glenetta Hew, M.D., M.S. Interventional Cardiologist   Pager # [phone] Phone # [phone] [address] [place], [place] [address]   Thank you for choosing Heartcare at [facility]!!

## 2019-05-22 NOTE — Patient Instructions (Signed)
Medication Instructions:  OK TO STOP PLAVIX  START ASPIRIN 81MG  *If you need a refill on your cardiac medications before your next appointment, please call your pharmacy*  Lab Work: [place] LAB RESULTS VIA [place]  If you have labs (blood work) drawn today and your tests are completely normal, you will receive your results only by:  [place] (if you have MyChart) OR A paper copy in the mail.  If you have any lab test that is abnormal or we need to change your treatment, we will call you to review the results.   You may go to any [facility] that is convenient for you however, we do have a lab in our office that is able to assist you. You DO NOT need an appointment for our lab. The lab is open 8:00am and closes at 4:00pm. Lunch 12:45 - 1:45pm.  Follow-Up: Your next appointment:  12 month(s) Please call our office 2 months in advance to schedule this appointment In Person with Glenetta Hew, MD  At [facility], you and your health needs are our priority.  As part of our continuing mission to provide you with exceptional heart care, we have created designated Provider Care Teams.  These Care Teams include your primary Cardiologist (physician) and Advanced Practice Providers (APPs -  Physician Assistants and Nurse Practitioners) who all work together to provide you with the care you need, when you need it.

## 2019-05-28 ENCOUNTER — Encounter: Payer: Self-pay | Admitting: Cardiology

## 2019-05-28 NOTE — Assessment & Plan Note (Signed)
Blood pressure well controlled on current dose of Toprol.  APP to increase,  next choice medicine will be ARB given diastolic function noted on echo, and diabetes.

## 2019-05-28 NOTE — Assessment & Plan Note (Addendum)
Preserved EF postop.  Never really had a significant murmur symptomatically.  No angina or heart failure symptoms now.  This is despite having grade 2-3 diastolic dysfunction noted on echo.Marland Kitchen

## 2019-05-28 NOTE — Assessment & Plan Note (Signed)
She asked about stopping Plavix for upcoming colonoscopy.  Plan for now actually be to stop Plavix altogether and go back to aspirin 81 mg.  She is a low risk patient for any colonoscopy procedure and would be okay to hold aspirin if necessary.  No further evaluation required within this year for any planned colonoscopy procedures.[name] [name]

## 2019-05-28 NOTE — Assessment & Plan Note (Signed)
She is doing exercise and diet counseling.  Discussed the importance of working on making some headway in this area.

## 2019-05-28 NOTE — Assessment & Plan Note (Signed)
Pretty much resolved.  Had a monitor that did not show any significant findings.  I suspect it was related to anxiety.

## 2019-05-28 NOTE — Assessment & Plan Note (Signed)
Doing very well now almost 2 and half years post CABG. she has not had any further angina.  She is on low-dose Toprol and Vytorin.  Echo was suggestive is having significant diastolic dysfunction, but that would not along with her blood pressure and lack of symptoms.  Follow-up echo still showed that in 2019.  Monitor for signs of heart failure and increasing blood pressures.  If they were to increase, would have low threshold to start ARB given her diabetes.  No diuretic requirement.  Plan: Continue current meds but will stop Plavix now and convert to aspirin.

## 2019-05-28 NOTE — Assessment & Plan Note (Signed)
She now has primary care and is on insulin along with Jardiance given her coronary disease.  As such, she is not really requiring any diuretic.

## 2019-05-30 ENCOUNTER — Other Ambulatory Visit: Payer: Self-pay

## 2019-05-30 DIAGNOSIS — I10 Essential (primary) hypertension: Secondary | ICD-10-CM

## 2019-05-31 NOTE — Addendum Note (Signed)
Addended by: Merri Ray A on: 05/31/2019 08:11 AM   Modules accepted: Orders

## 2020-10-02 HISTORY — PX: CORONARY ANGIOPLASTY WITH STENT PLACEMENT: SHX49

## 2020-10-06 ENCOUNTER — Ambulatory Visit (HOSPITAL_COMMUNITY)
Admission: EM | Admit: 2020-10-06 | Discharge: 2020-10-06 | Disposition: A | Discharge status: Home / Self Care | Payer: No Typology Code available for payment source | Attending: Physician Assistant | Admitting: Physician Assistant

## 2020-10-06 ENCOUNTER — Other Ambulatory Visit: Payer: Self-pay

## 2020-10-06 ENCOUNTER — Encounter (HOSPITAL_COMMUNITY): Payer: Self-pay

## 2020-10-06 DIAGNOSIS — R21 Rash and other nonspecific skin eruption: Secondary | ICD-10-CM | POA: Diagnosis not present

## 2020-10-06 MED ORDER — HYDROCORTISONE 1 % EX CREA: TOPICAL_CREAM | CUTANEOUS | 0 refills | Status: AC

## 2020-10-06 NOTE — ED Provider Notes (Signed)
[place]    CSN: [id number] Arrival date & time: 10/06/20  1755      History   Chief Complaint Chief Complaint  Patient presents with   Rash    HPI [name] [name] is a 50 y.o. female.   Pt complains of an itching rash under her right breast that she noticed about three days ago.  She reports the itching has improved since onset.  She has had intertrigo before and reports this rash is not similar to that.  She has tried nothing for the sx.    Past Medical History:  Diagnosis Date   Allergic rhinitis    Anemia    Arthritis    "mostly in my hands" (11/23/2016)   Borderline high cholesterol    CAD, multiple vessel 11/2016   Diffuse proximal RCA with 100% occluded distal RCA.  Severe 75-80% proximal LAD and proximal Circumflex disease. 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   LIMA to LAD, SVG to D1, SVG to OM, SVG to RPL, EVH via right thigh and leg   Sleep apnea    "couldn't tolerate the mask; only had a slight case of it" (11/23/2016)   Type II diabetes mellitus ([place])    Vasculitis ([place])     Patient Active Problem List   Diagnosis Date Noted   Rapid palpitations 12/01/2017   Shortness of breath 12/01/2017   Morbid obesity ([place]) BMI 38 with hyperlipidemia, diabetes and CAD 05/26/2017   Hypertensive cardiovascular disease 12/20/2016   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 No further Angina 11/01/2016   HYPOTHYROIDISM 11/20/2008   Insulin dependent diabetes mellitus 11/20/2008   Dyslipidemia, goal LDL below 70  11/20/2008   ANEMIA-UNSPECIFIED 11/20/2008   DIARRHEA 11/20/2008   ULCERATIVE COLITIS-LEFT SIDE 04/11/2008    Past Surgical History:  Procedure Laterality Date   ABDOMINAL HYSTERECTOMY  07/2006   "Davinci; partial"   CARDIAC EVENT MONITOR  12/2017   Patient monitored for 17d 12h 51m  No arrhythmias noted.  No SVT or PAT, PAF.  < 1% PAC or PVC.   CESAREAN SECTION  1994   COLONOSCOPY W/ BIOPSIES AND POLYPECTOMY  03/2008- 2017 X "4 at least"   CORONARY ARTERY BYPASS GRAFT N/A 11/26/2016   Procedure: CORONARY ARTERY BYPASS GRAFTING (CABG), ON PUMP, TIMES FOUR, USING LEFT INTERNAL MAMMARY ARTERY AND ENDOSCOPICALLY HARVESTED RIGHT GREATER SAPHENOUS VEIN;  Surgeon: ORexene Alberts MD;  Location[place] [facility]  Service: Open Heart Surgery;  Laterality: N/A;  LIMA-LAD, SVG-OM, SVG-PL, SVG-DIAG   DILATION AND CURETTAGE OF UTERUS  1996   LEFT HEART CATH AND CORONARY ANGIOGRAPHY N/A 11/23/2016   Procedure: LEFT HEART CATH AND CORONARY ANGIOGRAPHY;  Surgeon: CSherren Mocha MD;  [facility];  Service: Cardiovascular:  Diffuse proximal RCA with 100% occluded distal RCA.  Severe 75-80% proximal LAD and proximal Circumflex disease.  Mild LV dysfunction with akinesis in the mid inferior wall through the anterolateral and periapical walls. --Referred for CABG   TEE WITHOUT  CARDIOVERSION N/A 11/26/2016   Procedure: TRANSESOPHAGEAL ECHOCARDIOGRAM (TEE);  Surgeon: Rexene Alberts, MD;  Location: [facility] OR;  Service: Open Heart Surgery;;; EF 50-55% with GRII DD.  Mild mitral valve leaflet thickening.  Trace TR.   TRANSTHORACIC ECHOCARDIOGRAM  12/2017   Normal LV size and function.  EF 55 to 60%.  ?GR 3 DD.  Moderate LA dilation.    OB History   No obstetric history on file.      Home Medications    Prior to Admission medications   Medication Sig Start Date End Date Taking? Authorizing Provider  hydrocortisone cream 1 % Apply to affected area 2 times daily 10/06/20  Yes Ward, Lenise Arena, PA-C   clobetasol ointment (TEMOVATE) 0.05 % Apply to affected areas on scalp 4-5 times per week. Not to face. 01/18/19   [provider]  clopidogrel (PLAVIX) 75 MG tablet TAKE ONE TABLET BY MOUTH ONCE EVERY DAY BE SURE TO CHECK WITH YOUR DOCTOR ABOUT HOW LONG YOU SHOULD TAKE THIS MEDICINE    [provider]  empagliflozin (JARDIANCE) 10 MG TABS tablet TAKE ONE TABLET BY MOUTH EVERY DAY FOR DIABETES    [provider]  ezetimibe (ZETIA) 10 MG tablet Take 1 tablet (10 mg total) by mouth daily. 02/16/18 05/17/18  Leonie Man, MD  ezetimibe-simvastatin (VYTORIN) 10-10 MG tablet Take by mouth.    [provider]  insulin glargine (LANTUS) 100 UNIT/ML injection Inject 0.25 mLs (25 Units total) into the skin at bedtime. Patient taking differently: Inject 40 Units into the skin at bedtime.  12/02/16   Gold, Patrick Jupiter E, PA-C  metoprolol succinate (TOPROL-XL) 25 MG 24 hr tablet TAKE ONE TABLET BY MOUTH EVERY DAY FOR HEART AND BLOOD PRESSURE    [provider]  nitroGLYCERIN (NITROSTAT) 0.4 MG SL tablet Place 1 tablet (0.4 mg total) under the tongue every 5 (five) minutes as needed for chest pain. 12/01/17 03/01/18  Leonie Man, MD  omeprazole (PRILOSEC) 20 MG capsule Take 2 tablets by mouth once daily    [provider]  SUMAtriptan (IMITREX) 100 MG tablet Take 50-100 mg by mouth 2 (two) times daily as needed for migraine. May repeat in 2 hours if headache persists or recurs.    [provider]  thyroid (ARMOUR) 30 MG tablet Take by mouth. 05/02/19   [provider]    Family History Family History  Problem Relation Age of Onset   Cervical cancer Paternal Grandmother    Kidney disease Paternal Grandmother        ???   Diabetes Maternal Grandmother    Heart disease Maternal Grandmother    Heart disease Maternal Grandfather    Colon cancer Neg Hx     Social History Social History   Tobacco Use   Smoking status: Never    Smokeless tobacco: Never  Vaping Use   Vaping Use: Never used  Substance Use Topics   Alcohol use: No   Drug use: No     Allergies   Glimepiride and Shellfish allergy   Review of Systems Review of Systems  Constitutional:  Negative for chills and fever.  HENT:  Negative for ear pain and sore throat.   Eyes:  Negative for pain and visual disturbance.  Respiratory:  Negative for cough and shortness of breath.   Cardiovascular:  Negative for chest pain and palpitations.  Gastrointestinal:  Negative for abdominal pain and vomiting.  Genitourinary:  Negative for dysuria and hematuria.  Musculoskeletal:  Negative for  arthralgias and back pain.  Skin:  Positive for rash. Negative for color change.  Neurological:  Negative for seizures and syncope.  All other systems reviewed and are negative.   Physical Exam Triage Vital Signs ED Triage Vitals  Enc Vitals Group     BP 10/06/20 1843 112/69     Pulse Rate 10/06/20 1843 64     Resp 10/06/20 1843 16     Temp 10/06/20 1843 98.9 F (37.2 C)     Temp Source 10/06/20 1843 Oral     SpO2 10/06/20 1843 99 %     Weight --      Height --      Head Circumference --      Peak Flow --      Pain Score 10/06/20 1842 0     Pain Loc --      Pain Edu? --      Excl. in [place]? --    No data found.  Updated Vital Signs BP 112/69 (BP Location: Left Arm)   Pulse 64   Temp 98.9 F (37.2 C) (Oral)   Resp 16   SpO2 99%   Visual Acuity Right Eye Distance:   Left Eye Distance:   Bilateral Distance:    Right Eye Near:   Left Eye Near:    Bilateral Near:     Physical Exam Vitals and nursing note reviewed.  Constitutional:      General: She is not in acute distress.    Appearance: She is well-developed.  HENT:     Head: Normocephalic and atraumatic.  Eyes:     Conjunctiva/sclera: Conjunctivae normal.  Cardiovascular:     Rate and Rhythm: Normal rate and regular rhythm.     Heart sounds: No murmur heard. Pulmonary:     Effort:  Pulmonary effort is normal. No respiratory distress.     Breath sounds: Normal breath sounds.  Abdominal:     Palpations: Abdomen is soft.     Tenderness: There is no abdominal tenderness.  Musculoskeletal:     Cervical back: Neck supple.  Skin:    General: Skin is warm and dry.       Neurological:     Mental Status: She is alert.     UC Treatments / Results  Labs (all labs ordered are listed, but only abnormal results are displayed) Labs Reviewed - No data to display  EKG   Radiology No results found.  Procedures Procedures (including critical care time)  Medications Ordered in UC Medications - No data to display  Initial Impression / Assessment and Plan / UC Course  I have reviewed the triage vital signs and the nursing notes.  Pertinent labs & imaging results that were available during my care of the patient were reviewed by me and considered in my medical decision making (see chart for details).     Rash.  Rash is not consistent with intertrigo or shingles. At this time pt reports itching has improved.  Hydrocortisone cream prescribed to apply to affected area.  Final Clinical Impressions(s) / UC Diagnoses   Final diagnoses:  Rash     Discharge Instructions      Apply hydrocortisone to affected area   ED Prescriptions     Medication Sig Dispense Auth. Provider   hydrocortisone cream 1 % Apply to affected area 2 times daily 15 g Ward, Lenise Arena, PA-C      PDMP not reviewed this encounter.   Ward, Lenise Arena, PA-C 10/06/20 1907

## 2020-10-06 NOTE — Discharge Instructions (Addendum)
Apply hydrocortisone to affected area

## 2020-10-06 NOTE — ED Triage Notes (Signed)
Pt present rash underneath breast, pt noticed the area three days ago. Pt states there are some itching.

## 2020-10-10 ENCOUNTER — Encounter (HOSPITAL_COMMUNITY): Payer: Self-pay

## 2020-10-10 ENCOUNTER — Telehealth (HOSPITAL_COMMUNITY): Payer: Self-pay

## 2020-10-10 NOTE — Telephone Encounter (Signed)
Outside/paper referral received by Dr. Oretha Ellis from the [facility]. Insurance benefits and eligibility to be determined.

## 2020-10-10 NOTE — Telephone Encounter (Signed)
Attempted to call patient in regards to Cardiac Rehab - LM on VM Mailed letter 

## 2020-10-20 ENCOUNTER — Telehealth (HOSPITAL_COMMUNITY): Payer: Self-pay

## 2020-10-20 NOTE — Telephone Encounter (Signed)
Pt called wanting to know the hours of the cardiac rehab program. Pt stated that she was referred by the [place] and needed to know the hours for work. I provided the hours and pt is interested. Gave pt [employer] paperwork to the nurse navigator.

## 2020-10-21 ENCOUNTER — Telehealth (HOSPITAL_COMMUNITY): Payer: Self-pay | Admitting: *Deleted

## 2020-10-21 NOTE — Telephone Encounter (Signed)
Received [id number] for this pt to participate in cardiac rehabs/p NSTEMi in [place].  Pt seen on 8/8 at the cardiology clinic at the [facility]. Need additional information. Called and left message for this veteran to please call back. Contact number provided. Cherre Huger, BSN Cardiac and Training and development officer

## 2020-10-23 ENCOUNTER — Telehealth (HOSPITAL_COMMUNITY): Payer: Self-pay | Admitting: *Deleted

## 2020-10-23 NOTE — Telephone Encounter (Signed)
Called and left second message for pt. Need to know if any preparations for scheduling staged PCI have been made. Cherre Huger, BSN Cardiac and Training and development officer

## 2020-10-24 NOTE — Telephone Encounter (Signed)
Pt called back wanting to speak with Carlette RN nurse navigator for cardiac and pulmonary rehab. I advised pt that she was on a phone call and I read Carlette' s message to her and she stated that cardiac rehab is not going to work with her schedule she has right now with getting her granddaughter and her schedule period. I advised pt that I would let Carlette know and close her cardiac rehab referral.

## 2021-01-28 ENCOUNTER — Encounter (HOSPITAL_BASED_OUTPATIENT_CLINIC_OR_DEPARTMENT_OTHER): Payer: Self-pay

## 2021-01-28 ENCOUNTER — Emergency Department (HOSPITAL_BASED_OUTPATIENT_CLINIC_OR_DEPARTMENT_OTHER): Payer: PRIVATE HEALTH INSURANCE

## 2021-01-28 ENCOUNTER — Other Ambulatory Visit: Payer: Self-pay

## 2021-01-28 DIAGNOSIS — R600 Localized edema: Secondary | ICD-10-CM | POA: Diagnosis not present

## 2021-01-28 DIAGNOSIS — Z79899 Other long term (current) drug therapy: Secondary | ICD-10-CM | POA: Diagnosis not present

## 2021-01-28 DIAGNOSIS — Z951 Presence of aortocoronary bypass graft: Secondary | ICD-10-CM | POA: Diagnosis not present

## 2021-01-28 DIAGNOSIS — Z955 Presence of coronary angioplasty implant and graft: Secondary | ICD-10-CM | POA: Diagnosis not present

## 2021-01-28 DIAGNOSIS — I10 Essential (primary) hypertension: Secondary | ICD-10-CM | POA: Insufficient documentation

## 2021-01-28 DIAGNOSIS — I2510 Atherosclerotic heart disease of native coronary artery without angina pectoris: Secondary | ICD-10-CM | POA: Insufficient documentation

## 2021-01-28 DIAGNOSIS — E119 Type 2 diabetes mellitus without complications: Secondary | ICD-10-CM | POA: Insufficient documentation

## 2021-01-28 DIAGNOSIS — Z7902 Long term (current) use of antithrombotics/antiplatelets: Secondary | ICD-10-CM | POA: Insufficient documentation

## 2021-01-28 DIAGNOSIS — M79605 Pain in left leg: Secondary | ICD-10-CM | POA: Diagnosis not present

## 2021-01-28 DIAGNOSIS — E039 Hypothyroidism, unspecified: Secondary | ICD-10-CM | POA: Diagnosis not present

## 2021-01-28 DIAGNOSIS — Z794 Long term (current) use of insulin: Secondary | ICD-10-CM | POA: Insufficient documentation

## 2021-01-28 NOTE — ED Triage Notes (Signed)
Pt presents to the ED with left leg pain. Redness noted to the posterior aspect of left calf. Pt states that she first noticed this today. Pt states that she does take a blood thinner, but denies hx of a clot. Pt A&Ox4 at time of triage. VSS.

## 2021-01-29 ENCOUNTER — Emergency Department (HOSPITAL_BASED_OUTPATIENT_CLINIC_OR_DEPARTMENT_OTHER)
Admission: EM | Admit: 2021-01-29 | Discharge: 2021-01-29 | Disposition: A | Discharge status: Home / Self Care | Payer: PRIVATE HEALTH INSURANCE | Attending: Emergency Medicine | Admitting: Emergency Medicine

## 2021-01-29 DIAGNOSIS — M79662 Pain in left lower leg: Secondary | ICD-10-CM

## 2021-01-29 DIAGNOSIS — R609 Edema, unspecified: Secondary | ICD-10-CM

## 2021-01-29 MED ORDER — PREDNISONE 50 MG PO TABS
60.0000 mg | ORAL_TABLET | Freq: Once | ORAL | Status: AC
  Administered 2021-01-29: 05:00:00 60 mg via ORAL
  Filled 2021-01-29: qty 1

## 2021-01-29 MED ORDER — PREDNISONE 50 MG PO TABS: 50.0000 mg | ORAL_TABLET | Freq: Every day | ORAL | 0 refills | Status: AC

## 2021-01-29 MED ORDER — FUROSEMIDE 40 MG PO TABS
40.0000 mg | ORAL_TABLET | Freq: Every day | ORAL | 0 refills | Status: AC
Start: 2021-01-29 — End: ?

## 2021-01-29 NOTE — ED Notes (Signed)
Family at bedside. 

## 2021-01-29 NOTE — Discharge Instructions (Signed)
Apply warm compresses.  Take acetaminophen as needed for pain.  Return if symptoms are getting worse.

## 2021-01-29 NOTE — ED Provider Notes (Signed)
[place] EMERGENCY DEPT Provider Note   CSN: [id number] Arrival date & time: 01/28/21  2236     History Chief Complaint  Patient presents with   Leg Pain    [name] [name] is a 50 y.o. female.  The history is provided by the patient.  Leg Pain She has a complex medical history which includes hypertension, diabetes, hyperlipidemia, coronary artery disease, Graves' disease, ulcerative colitis, vasculitis.  She noted some prickly sensation in her left calf today which has gotten worse.  It is now also starting to hurt.  She noted some red markings on her leg and was concerned for possible DVT.  She denies chest pain, heaviness, tightness, pressure.  She denies any dyspnea.  She denies any trauma.  Pain is rated at 6/10.   Past Medical History:  Diagnosis Date   Allergic rhinitis    Anemia    Arthritis    "mostly in my hands" (11/23/2016)   Borderline high cholesterol    CAD, multiple vessel 11/2016   Diffuse proximal RCA with 100% occluded distal RCA.  Severe 75-80% proximal LAD and proximal Circumflex disease. 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   LIMA to LAD, SVG to D1, SVG to OM, SVG to RPL, EVH via right thigh and leg   Sleep apnea    "couldn't tolerate the mask; only had a slight case of it" (11/23/2016)   Type II diabetes mellitus ([place])    Vasculitis ([place])     Patient Active Problem List   Diagnosis Date Noted   Rapid palpitations 12/01/2017   Shortness of breath 12/01/2017   Morbid obesity ([place]) BMI 38 with hyperlipidemia, diabetes and CAD 05/26/2017   Hypertensive cardiovascular disease 12/20/2016   S/P CABG x 4  11/26/2016   Essential hypertension 11/24/2016   NSTEMI (non-ST elevated myocardial infarction) ([place]) 11/22/2016   CAD, multiple vessel; s/p CABG. No further Angina 11/01/2016   HYPOTHYROIDISM 11/20/2008   Insulin dependent diabetes mellitus 11/20/2008   Dyslipidemia, goal LDL below 70 11/20/2008   ANEMIA-UNSPECIFIED 11/20/2008   DIARRHEA 11/20/2008   ULCERATIVE COLITIS-LEFT SIDE 04/11/2008    Past Surgical History:  Procedure Laterality Date   ABDOMINAL HYSTERECTOMY  07/2006   "Davinci; partial"   CARDIAC EVENT MONITOR  12/2017   Patient monitored for 17d 12h 12m  No arrhythmias noted.  No SVT or PAT, PAF.  < 1% PAC or PVC.   CESAREAN SECTION  1994   COLONOSCOPY W/ BIOPSIES AND POLYPECTOMY  03/2008- 2017 X "4 at least"   CORONARY ANGIOPLASTY WITH STENT PLACEMENT  10/02/2020   CORONARY ARTERY BYPASS GRAFT N/A 11/26/2016   Procedure: CORONARY ARTERY BYPASS GRAFTING (CABG), ON PUMP, TIMES FOUR, USING LEFT INTERNAL MAMMARY ARTERY AND ENDOSCOPICALLY HARVESTED RIGHT GREATER SAPHENOUS VEIN;  Surgeon: ORexene Alberts MD;  Location: [facility]  Service: Open Heart Surgery;  Laterality: N/A;  LIMA-LAD, SVG-OM, SVG-PL, SVG-DIAG   DILATION AND CURETTAGE OF UTERUS  1996   LEFT HEART CATH AND CORONARY ANGIOGRAPHY N/A 11/23/2016   Procedure: LEFT HEART CATH AND CORONARY ANGIOGRAPHY;  Surgeon: CSherren Mocha MD;  [facility];  Service: Cardiovascular:  Diffuse proximal RCA with 100% occluded distal RCA.  Severe 75-80% proximal LAD and proximal Circumflex disease.  Mild LV dysfunction with akinesis in the mid inferior wall through the anterolateral and periapical walls. --Referred for CABG   TEE WITHOUT CARDIOVERSION N/A 11/26/2016   Procedure: TRANSESOPHAGEAL ECHOCARDIOGRAM (TEE);  Surgeon: Rexene Alberts, MD;  Location: [facility] OR;  Service: Open Heart Surgery;;; EF 50-55% with GRII DD.  Mild mitral valve leaflet thickening.  Trace TR.   TRANSTHORACIC ECHOCARDIOGRAM  12/2017   Normal LV  size and function.  EF 55 to 60%.  ?GR 3 DD.  Moderate LA dilation.     OB History   No obstetric history on file.     Family History  Problem Relation Age of Onset   Cervical cancer Paternal Grandmother    Kidney disease Paternal Grandmother        ???   Diabetes Maternal Grandmother    Heart disease Maternal Grandmother    Heart disease Maternal Grandfather    Colon cancer Neg Hx     Social History   Tobacco Use   Smoking status: Never   Smokeless tobacco: Never  Vaping Use   Vaping Use: Never used  Substance Use Topics   Alcohol use: No   Drug use: No    Home Medications Prior to Admission medications   Medication Sig Start Date End Date Taking? Authorizing Provider  clobetasol ointment (TEMOVATE) 0.05 % Apply to affected areas on scalp 4-5 times per week. Not to face. 01/18/19   [provider]  clopidogrel (PLAVIX) 75 MG tablet TAKE ONE TABLET BY MOUTH ONCE EVERY DAY BE SURE TO CHECK WITH YOUR DOCTOR ABOUT HOW LONG YOU SHOULD TAKE THIS MEDICINE    [provider]  empagliflozin (JARDIANCE) 10 MG TABS tablet TAKE ONE TABLET BY MOUTH EVERY DAY FOR DIABETES    [provider]  ezetimibe (ZETIA) 10 MG tablet Take 1 tablet (10 mg total) by mouth daily. 02/16/18 05/17/18  Leonie Man, MD  ezetimibe-simvastatin (VYTORIN) 10-10 MG tablet Take by mouth.    [provider]  hydrocortisone cream 1 % Apply to affected area 2 times daily 10/06/20   Ward, Janett Billow Z, PA-C  insulin glargine (LANTUS) 100 UNIT/ML injection Inject 0.25 mLs (25 Units total) into the skin at bedtime. Patient taking differently: Inject 40 Units into the skin at bedtime.  12/02/16   Gold, Patrick Jupiter E, PA-C  metoprolol succinate (TOPROL-XL) 25 MG 24 hr tablet TAKE ONE TABLET BY MOUTH EVERY DAY FOR HEART AND BLOOD PRESSURE    [provider]  nitroGLYCERIN (NITROSTAT) 0.4 MG SL tablet Place 1 tablet (0.4 mg total) under the tongue every 5 (five) minutes as needed for  chest pain. 12/01/17 03/01/18  Leonie Man, MD  omeprazole (PRILOSEC) 20 MG capsule Take 2 tablets by mouth once daily    [provider]  SUMAtriptan (IMITREX) 100 MG tablet Take 50-100 mg by mouth 2 (two) times daily as needed for migraine. May repeat in 2 hours if headache persists or recurs.    [provider]  thyroid (ARMOUR) 30 MG tablet Take by mouth. 05/02/19   [provider]    Allergies    Glimepiride, Lactose, Levothyroxine sodium, Metformin, Shellfish allergy, and Sulfasalazine  Review of Systems   Review of Systems  All other systems reviewed and are negative.  Physical Exam Updated Vital Signs BP 97/64 (BP Location: Right Arm)    Pulse 83  Temp 98 F (36.7 C) (Oral)    Resp 18    Ht 5' 1"  (1.549 m)    Wt 85.3 kg    SpO2 100%    BMI 35.52 kg/m   Physical Exam Vitals and nursing note reviewed.  50 year old female, resting comfortably and in no acute distress. Vital signs are normal. Oxygen saturation is 100%, which is normal. Head is normocephalic and atraumatic. PERRLA, EOMI. Oropharynx is clear. Neck is nontender and supple without adenopathy or JVD. Back is nontender and there is no CVA tenderness. Lungs are clear without rales, wheezes, or rhonchi. Chest is nontender. Heart has regular rate and rhythm without murmur. Abdomen is soft, flat, nontende. Extremities: There is 1+ pitting edema present.  Left calf has a serpiginous, erythematous lesion which is slightly raised and has the general appearance of superficial phlebitis.  There is no warmth, but it is tender to palpation.  Does not blanch.  Bevelyn Buckles' sign is negative. Skin is warm and dry without other rash. Neurologic: Mental status is normal, cranial nerves are intact, moves all extremities equally.   ED Results / Procedures / Treatments    Radiology US Venous Img Lower Unilateral Left  Result Date: 01/29/2021 CLINICAL DATA:  Posterior left calf pain and redness. EXAM:  LEFT LOWER EXTREMITY VENOUS DOPPLER ULTRASOUND TECHNIQUE: Gray-scale sonography with compression, as well as color and duplex ultrasound, were performed to evaluate the deep venous system(s) from the level of the common femoral vein through the popliteal and proximal calf veins. COMPARISON:  None. FINDINGS: VENOUS Normal compressibility of the common femoral, superficial femoral, and popliteal veins, as well as the visualized calf veins. Visualized portions of profunda femoral vein and great saphenous vein unremarkable. No filling defects to suggest DVT on grayscale or color Doppler imaging. Doppler waveforms show normal direction of venous flow, normal respiratory plasticity and response to augmentation. Limited views of the contralateral common femoral vein are unremarkable. OTHER Marked severity soft tissue edema is seen along the posterior aspect of the LEFT calf. Limitations: none IMPRESSION: LEFT calf edema without evidence of DVT within the LEFT lower extremity. Electronically Signed   By: Virgina Norfolk M.D.   On: 01/29/2021 00:13    Procedures Procedures   Medications Ordered in ED Medications  predniSONE (DELTASONE) tablet 60 mg (has no administration in time range)    ED Course  I have reviewed the triage vital signs and the nursing notes.  Pertinent imaging results that were available during my care of the patient were reviewed by me and considered in my medical decision making (see chart for details).   MDM Rules/Calculators/A&P                         Left calf pain of uncertain cause.  Exam is suggestive of superficial phlebitis, but ultrasound was obtained showing no evidence of DVT or superficial phlebitis.  She is already on aspirin and Plavix which would be appropriate treatments for superficial phlebitis.  Ultrasound did show presence of edema in the calf which does correlate with physical exam.  I would wonder if this might be related to her underlying autoimmune conditions  of vasculitis and ulcerative colitis.  There is no warmth or lymphangitic streaks to suggest cellulitis.  We will try a burst of prednisone to see if it causes any improvement, will also give a short course of a diuretic to reduce the edema.  She is referred back to her primary care  provider for further work-up and consideration for referral to rheumatology.  Return if symptoms are worsening.  Old records are reviewed, and she has no relevant past visits.  Final Clinical Impression(s) / ED Diagnoses Final diagnoses:  Pain of left calf  Peripheral edema    Rx / DC Orders ED Discharge Orders          Ordered    predniSONE (DELTASONE) 50 MG tablet  Daily        01/29/21 0431    furosemide (LASIX) 40 MG tablet  Daily        01/29/21 6816             Delora Fuel, MD 61/96/94 [phone]

## 2023-06-22 ENCOUNTER — Encounter (HOSPITAL_BASED_OUTPATIENT_CLINIC_OR_DEPARTMENT_OTHER): Payer: Self-pay | Admitting: Emergency Medicine

## 2023-06-22 ENCOUNTER — Emergency Department (HOSPITAL_BASED_OUTPATIENT_CLINIC_OR_DEPARTMENT_OTHER)
Admission: EM | Admit: 2023-06-22 | Discharge: 2023-06-22 | Disposition: A | Discharge status: Home / Self Care | Attending: Emergency Medicine | Admitting: Emergency Medicine

## 2023-06-22 ENCOUNTER — Emergency Department (HOSPITAL_BASED_OUTPATIENT_CLINIC_OR_DEPARTMENT_OTHER)

## 2023-06-22 ENCOUNTER — Other Ambulatory Visit: Payer: Self-pay

## 2023-06-22 DIAGNOSIS — E119 Type 2 diabetes mellitus without complications: Secondary | ICD-10-CM | POA: Diagnosis not present

## 2023-06-22 DIAGNOSIS — Z7901 Long term (current) use of anticoagulants: Secondary | ICD-10-CM | POA: Diagnosis not present

## 2023-06-22 DIAGNOSIS — Z7984 Long term (current) use of oral hypoglycemic drugs: Secondary | ICD-10-CM | POA: Insufficient documentation

## 2023-06-22 DIAGNOSIS — I2510 Atherosclerotic heart disease of native coronary artery without angina pectoris: Secondary | ICD-10-CM | POA: Diagnosis not present

## 2023-06-22 DIAGNOSIS — R1084 Generalized abdominal pain: Secondary | ICD-10-CM

## 2023-06-22 DIAGNOSIS — K529 Noninfective gastroenteritis and colitis, unspecified: Secondary | ICD-10-CM | POA: Insufficient documentation

## 2023-06-22 LAB — COMPREHENSIVE METABOLIC PANEL WITH GFR
ALT: 8 U/L (ref 0–44)
AST: 20 U/L (ref 15–41)
Albumin: 4.1 g/dL (ref 3.5–5.0)
Alkaline Phosphatase: 124 U/L (ref 38–126)
Anion gap: 12 (ref 5–15)
BUN: 11 mg/dL (ref 6–20)
CO2: 22 mmol/L (ref 22–32)
Calcium: 9.4 mg/dL (ref 8.9–10.3)
Chloride: 104 mmol/L (ref 98–111)
Creatinine, Ser: 0.9 mg/dL (ref 0.44–1.00)
GFR, Estimated: >60 mL/min (ref >60)
Glucose, Bld: 108 mg/dL — ABNORMAL HIGH (ref 70–99)
Potassium: 3.8 mmol/L (ref 3.5–5.1)
Sodium: 138 mmol/L (ref 135–145)
Total Bilirubin: 1.1 mg/dL (ref 0.0–1.2)
Total Protein: 7.5 g/dL (ref 6.5–8.1)

## 2023-06-22 LAB — CBC
HCT: 39.9 % (ref 36.0–46.0)
Hemoglobin: 13 g/dL (ref 12.0–15.0)
MCH: 29.1 pg (ref 26.0–34.0)
MCHC: 32.6 g/dL (ref 30.0–36.0)
MCV: 89.5 fL (ref 80.0–100.0)
Platelets: 340 10*3/uL (ref 150–400)
RBC: 4.46 MIL/uL (ref 3.87–5.11)
RDW: 14.1 % (ref 11.5–15.5)
WBC: 9.8 10*3/uL (ref 4.0–10.5)
nRBC: 0 % (ref 0.0–0.2)

## 2023-06-22 LAB — URINALYSIS, ROUTINE W REFLEX MICROSCOPIC
Bilirubin Urine: NEGATIVE
Glucose, UA: NEGATIVE mg/dL
Hgb urine dipstick: NEGATIVE
Ketones, ur: 40 mg/dL — AB
Leukocytes,Ua: NEGATIVE
Nitrite: NEGATIVE
Specific Gravity, Urine: 1.03 (ref 1.005–1.030)
pH: 6 (ref 5.0–8.0)

## 2023-06-22 LAB — TROPONIN T, HIGH SENSITIVITY
Troponin T High Sensitivity: <15 ng/L (ref <19)
Troponin T High Sensitivity: <15 ng/L (ref <19)

## 2023-06-22 LAB — LIPASE, BLOOD: Lipase: 26 U/L (ref 11–51)

## 2023-06-22 MED ORDER — DICYCLOMINE HCL 10 MG PO CAPS
10.0000 mg | ORAL_CAPSULE | Freq: Once | ORAL | Status: AC
  Administered 2023-06-22: 10 mg via ORAL
  Filled 2023-06-22: qty 1

## 2023-06-22 MED ORDER — DICYCLOMINE HCL 20 MG PO TABS: 20.0000 mg | ORAL_TABLET | Freq: Two times a day (BID) | ORAL | 0 refills | Status: AC

## 2023-06-22 MED ORDER — ONDANSETRON HCL 4 MG/2ML IJ SOLN: 4.0000 mg | Freq: Once | INTRAMUSCULAR | Status: DC

## 2023-06-22 MED ORDER — HYDROMORPHONE HCL 1 MG/ML IJ SOLN: 0.5000 mg | Freq: Once | INTRAMUSCULAR | Status: DC

## 2023-06-22 MED ORDER — IOHEXOL 300 MG/ML  SOLN
100.0000 mL | Freq: Once | INTRAMUSCULAR | Status: AC | PRN
  Administered 2023-06-22: 100 mL via INTRAVENOUS

## 2023-06-22 MED ORDER — SODIUM CHLORIDE 0.9 % IV BOLUS
500.0000 mL | Freq: Once | INTRAVENOUS | Status: AC
  Administered 2023-06-22: 500 mL via INTRAVENOUS

## 2023-06-22 MED ORDER — MORPHINE SULFATE (PF) 4 MG/ML IV SOLN
4.0000 mg | Freq: Once | INTRAVENOUS | Status: AC
  Administered 2023-06-22: 4 mg via INTRAVENOUS
  Filled 2023-06-22: qty 1

## 2023-06-22 MED ORDER — ONDANSETRON HCL 4 MG/2ML IJ SOLN
4.0000 mg | Freq: Once | INTRAMUSCULAR | Status: AC
  Administered 2023-06-22: 4 mg via INTRAVENOUS
  Filled 2023-06-22: qty 2

## 2023-06-22 NOTE — ED Notes (Signed)
 Pt aware of the need for a urine... Unable to currently provide a sample.[name] KitchenMarland [name]

## 2023-06-22 NOTE — ED Notes (Signed)
 Called name in lobby and sub waiting, no answer. Called radiology, patient is not in their department.

## 2023-06-22 NOTE — Discharge Instructions (Addendum)
 Take Bentyl as prescribed. Follow up with your GI. Return to the ER for worsening or concerning symptoms.

## 2023-06-22 NOTE — ED Provider Notes (Signed)
 [place] EMERGENCY DEPARTMENT AT [facility] Provider Note   CSN: [id number] Arrival date & time: 06/22/23  0920     History  Chief Complaint  Patient presents with   Abdominal Pain    [name] [name] is a 53 y.o. female.  53 year old female with past medical history of ulcerative colitis, GERD, fibromyalgia, hyperlipidemia, NSTEMI, type 2 diabetes, multivessel CAD presents with complaint of abdominal pain.  States pain started yesterday afternoon after eating some reheated shrimp tempora.  Notes that she was having waves of pain throughout her abdomen with nausea and vomiting.  Last episode of emesis was 9 PM last night.  Continued to have abdominal pain today which prompted her to come to the emergency room.  States that she has a slight discomfort in left side of her chest as well without shortness of breath.  Denies fevers, changes in bowel or bladder habits.  Prior abdominal surgery includes partial hysterectomy.  Patient took Imodium and Benadryl without improvement in her symptoms.       Home Medications Prior to Admission medications   Medication Sig Start Date End Date Taking? Authorizing Provider  dicyclomine (BENTYL) 20 MG tablet Take 1 tablet (20 mg total) by mouth 2 (two) times daily. 06/22/23  Yes Darlis Eisenmenger, PA-C  clobetasol ointment (TEMOVATE) 0.05 % Apply to affected areas on scalp 4-5 times per week. Not to face. 01/18/19   [provider]  clopidogrel  (PLAVIX ) 75 MG tablet TAKE ONE TABLET BY MOUTH ONCE EVERY DAY BE SURE TO CHECK WITH YOUR DOCTOR ABOUT HOW LONG YOU SHOULD TAKE THIS MEDICINE    [provider]  empagliflozin (JARDIANCE) 10 MG TABS tablet TAKE ONE TABLET BY MOUTH EVERY DAY FOR DIABETES    [provider]  ezetimibe  (ZETIA ) 10 MG tablet Take 1 tablet (10 mg total) by mouth daily. 02/16/18 05/17/18  Arleen Lacer, MD  ezetimibe -simvastatin (VYTORIN) 10-10 MG tablet Take by mouth.    [provider]   furosemide  (LASIX ) 40 MG tablet Take 1 tablet (40 mg total) by mouth daily. 01/29/21   Alissa April, MD  hydrocortisone  cream 1 % Apply to affected area 2 times daily 10/06/20   Ward, Char Common, PA-C  insulin  glargine (LANTUS ) 100 UNIT/ML injection Inject 0.25 mLs (25 Units total) into the skin at bedtime. Patient taking differently: Inject 40 Units into the skin at bedtime.  12/02/16   Gold, Wayne E, PA-C  metoprolol  succinate (TOPROL -XL) 25 MG 24 hr tablet TAKE ONE TABLET BY MOUTH EVERY DAY FOR HEART AND BLOOD PRESSURE    [provider]  nitroGLYCERIN  (NITROSTAT ) 0.4 MG SL tablet Place 1 tablet (0.4 mg total) under the tongue every 5 (five) minutes as needed for chest pain. 12/01/17 03/01/18  Arleen Lacer, MD  omeprazole  (PRILOSEC) 20 MG capsule Take 2 tablets by mouth once daily    [provider]  predniSONE  (DELTASONE ) 50 MG tablet Take 1 tablet (50 mg total) by mouth daily. 01/29/21   Alissa April, MD  SUMAtriptan  (IMITREX ) 100 MG tablet Take 50-100 mg by mouth 2 (two) times daily as needed for migraine. May repeat in 2 hours if headache persists or recurs.    [provider]  thyroid  (ARMOUR) 30 MG tablet Take by mouth. 05/02/19   [provider]      Allergies    Glimepiride, Lactose, Levothyroxine  sodium, Metformin , Shellfish allergy, and Sulfasalazine    Review of Systems   Review of Systems Negative except as per  HPI Physical Exam Updated Vital Signs BP 128/77   Pulse 70   Temp 98.1 F (36.7 C) (Oral)   Resp 14   SpO2 98%  Physical Exam Vitals and nursing note reviewed.  Constitutional:      General: She is not in acute distress.    Appearance: She is well-developed. She is not diaphoretic.  HENT:     Head: Normocephalic and atraumatic.  Cardiovascular:     Rate and Rhythm: Normal rate and regular rhythm.     Heart sounds: Normal heart sounds.  Pulmonary:     Effort: Pulmonary effort is normal.     Breath sounds: Normal breath  sounds.  Abdominal:     Palpations: Abdomen is soft.     Tenderness: There is generalized abdominal tenderness.  Musculoskeletal:     Right lower leg: No edema.     Left lower leg: No edema.  Skin:    General: Skin is warm and dry.  Neurological:     Mental Status: She is alert and oriented to person, place, and time.  Psychiatric:        Behavior: Behavior normal.     ED Results / Procedures / Treatments   Labs (all labs ordered are listed, but only abnormal results are displayed) Labs Reviewed  COMPREHENSIVE METABOLIC PANEL WITH GFR - Abnormal; Notable for the following components:      Result Value   Glucose, Bld 108 (*)    All other components within normal limits  URINALYSIS, ROUTINE W REFLEX MICROSCOPIC - Abnormal; Notable for the following components:   Ketones, ur 40 (*)    Protein, ur TRACE (*)    All other components within normal limits  LIPASE, BLOOD  CBC  TROPONIN T, HIGH SENSITIVITY  TROPONIN T, HIGH SENSITIVITY    EKG EKG Interpretation Date/Time:  Wednesday Jun 22 2023 09:51:51 EDT Ventricular Rate:  76 PR Interval:  144 QRS Duration:  88 QT Interval:  384 QTC Calculation: 432 R Axis:   55  Text Interpretation: Normal sinus rhythm Biatrial enlargement Possible Inferior infarct (cited on or before 26-Nov-2016) Cannot rule out Anterior infarct , age undetermined Abnormal ECG when compared to prior, overall similar appearance with less PVC No STEMI Confirmed by Wynell Heath ([id number]) on 06/22/2023 12:53:37 PM  Radiology CT ABDOMEN PELVIS W CONTRAST Result Date: 06/22/2023 CLINICAL DATA:  Abdominal pain, acute, nonlocalized EXAM: CT ABDOMEN AND PELVIS WITH CONTRAST TECHNIQUE: Multidetector CT imaging of the abdomen and pelvis was performed using the standard protocol following bolus administration of intravenous contrast. RADIATION DOSE REDUCTION: This exam was performed according to the departmental dose-optimization program which includes automated  exposure control, adjustment of the mA and/or kV according to patient size and/or use of iterative reconstruction technique. CONTRAST:  100mL OMNIPAQUE IOHEXOL 300 MG/ML  SOLN COMPARISON:  CT scan abdomen and pelvis from 05/21/2017. FINDINGS: Lower chest: The lung bases are clear. No pleural effusion. The heart is normal in size. No pericardial effusion. Hepatobiliary: The liver is normal in size. Non-cirrhotic configuration. No suspicious mass. These is mild diffuse hepatic steatosis. No intrahepatic or extrahepatic bile duct dilation. No calcified gallstones. Normal gallbladder wall thickness. No pericholecystic inflammatory changes. Pancreas: Unremarkable. No pancreatic ductal dilatation or surrounding inflammatory changes. Spleen: Within normal limits. No focal lesion. Adrenals/Urinary Tract: Adrenal glands are unremarkable. No suspicious renal mass. No hydronephrosis. No renal or ureteric calculi. Urinary bladder is under distended, precluding optimal assessment. However, no large mass or stones identified. No perivesical fat stranding. Stomach/Bowel:  There is a small sliding hiatal hernia. No disproportionate dilation of the small or large bowel loops. The appendix is unremarkable. There is mild thickening of several small bowel loops in the right anterior abdomen which exhibit subtle perienteric fat stranding and mild prominence of also recta. In appropriate clinical settings, findings favor focal enteritis. Vascular/Lymphatic: There is trace amount of ascites in the pelvis. No walled-off abscess or loculated collection. No pneumoperitoneum. No abdominal or pelvic lymphadenopathy, by size criteria. No aneurysmal dilation of the major abdominal arteries. There are mild peripheral atherosclerotic vascular calcifications of the aorta and its major branches. Reproductive: The uterus is surgically absent. No large adnexal mass. Other: There is a tiny fat containing umbilical hernia. The soft tissues and abdominal  wall are otherwise unremarkable. Musculoskeletal: No suspicious osseous lesions. IMPRESSION: 1. There is mild thickening of several small bowel loops in the right anterior abdomen which exhibit subtle perienteric fat stranding and mild prominence of vasa recta. In appropriate clinical settings, findings favor focal enteritis. No bowel obstruction. 2. Multiple other nonacute observations, as described above. Aortic Atherosclerosis (ICD10-I70.0). Electronically Signed   By: Beula Brunswick M.D.   On: 06/22/2023 15:24    Procedures Procedures    Medications Ordered in ED Medications  iohexol (OMNIPAQUE) 300 MG/ML solution 100 mL (100 mLs Intravenous Contrast Given 06/22/23 1435)  ondansetron  (ZOFRAN ) injection 4 mg (4 mg Intravenous Given 06/22/23 1502)  morphine  (PF) 4 MG/ML injection 4 mg (4 mg Intravenous Given 06/22/23 1502)  sodium chloride  0.9 % bolus 500 mL (0 mLs Intravenous Stopped 06/22/23 1633)  dicyclomine (BENTYL) capsule 10 mg (10 mg Oral Given 06/22/23 1640)    ED Course/ Medical Decision Making/ A&P                                 Medical Decision Making Amount and/or Complexity of Data Reviewed Labs: ordered. Radiology: ordered.  Risk Prescription drug management.   This patient presents to the ED for concern of abdominal pain, this involves an extensive number of treatment options, and is a complaint that carries with it a high risk of complications and morbidity.  The differential diagnosis includes but not limited to colitis, diverticulitis, mass, cholecystitis, appendicitis, pancreatitis, ACS   Co morbidities that complicate the patient evaluation  ulcerative colitis, GERD, fibromyalgia, hyperlipidemia, NSTEMI, type 2 diabetes, multivessel CAD   Additional history obtained:  External records from outside source obtained and reviewed including no recent relevant records available   Lab Tests:  I Ordered, and personally interpreted labs.  The pertinent results  include:  CBC WNL, lipase WNL, UA with trace ketones and protein. CMP without significant findings. Troponin x 2 both <15   Imaging Studies ordered:  I ordered imaging studies including CT a/p, I independently visualized and interpreted imaging which showed enteritis I agree with the radiologist interpretation   Cardiac Monitoring: / EKG:  The patient was maintained on a cardiac monitor.  I personally viewed and interpreted the cardiac monitored which showed an underlying rhythm of: Normal sinus rhythm, rate 76   Problem List / ED Course / Critical interventions / Medication management  53 year old female presents the emergency room with complaint of abdominal pain as above.  She does note some upper abdominal discomfort, due to her cardiac history, workup was extended to include cardiac rule out which was unremarkable.  Her labs are reassuring.  The CT scan suggest a right sided enteritis.  She  is not currently having diarrhea.  Pain is improved with medications provided.  Will discharge with prescription for Bentyl with plan to follow-up with her primary care and GI providers.  Advised return to ER for worsening or concerning symptoms. I ordered medication including morphine , Zofran , Bentyl for abdominal pain Reevaluation of the patient after these medicines showed that the patient improved I have reviewed the patients home medicines and have made adjustments as needed   Social Determinants of Health:  Has primary and specialty care.   Test / Admission - Considered:  Stable for discharge with plan to follow-up with her GI doctor at the [place].         Final Clinical Impression(s) / ED Diagnoses Final diagnoses:  Enteritis  Generalized abdominal pain    Rx / DC Orders ED Discharge Orders          Ordered    dicyclomine (BENTYL) 20 MG tablet  2 times daily        06/22/23 1636              Darlis Eisenmenger, PA-C 06/22/23 1642    Tegeler, Marine Sia,  MD 06/23/23 [phone]

## 2023-06-22 NOTE — ED Notes (Signed)
 Discharge paperwork given and verbally understood.

## 2023-06-22 NOTE — ED Triage Notes (Signed)
 C/o severe central ABD pain starting yesterday. +n/v/d. Hx of UC
